# Patient Record
Sex: Male | Born: 1940 | Race: White | Hispanic: No | Marital: Married | Smoking: Current every day smoker
Health system: Southern US, Community
[De-identification: ages and names within clinical notes are randomized; demographics above are authoritative.]

## PROBLEM LIST (undated history)

## (undated) DIAGNOSIS — E785 Hyperlipidemia, unspecified: Secondary | ICD-10-CM

## (undated) DIAGNOSIS — I10 Essential (primary) hypertension: Secondary | ICD-10-CM

## (undated) DIAGNOSIS — F419 Anxiety disorder, unspecified: Secondary | ICD-10-CM

## (undated) DIAGNOSIS — IMO0002 Reserved for concepts with insufficient information to code with codable children: Secondary | ICD-10-CM

## (undated) DIAGNOSIS — J449 Chronic obstructive pulmonary disease, unspecified: Secondary | ICD-10-CM

## (undated) DIAGNOSIS — D649 Anemia, unspecified: Secondary | ICD-10-CM

## (undated) DIAGNOSIS — C3490 Malignant neoplasm of unspecified part of unspecified bronchus or lung: Secondary | ICD-10-CM

## (undated) DIAGNOSIS — Z8709 Personal history of other diseases of the respiratory system: Secondary | ICD-10-CM

## (undated) DIAGNOSIS — C719 Malignant neoplasm of brain, unspecified: Secondary | ICD-10-CM

## (undated) DIAGNOSIS — Z789 Other specified health status: Secondary | ICD-10-CM

## (undated) DIAGNOSIS — IMO0001 Reserved for inherently not codable concepts without codable children: Secondary | ICD-10-CM

## (undated) DIAGNOSIS — C61 Malignant neoplasm of prostate: Secondary | ICD-10-CM

## (undated) DIAGNOSIS — I499 Cardiac arrhythmia, unspecified: Secondary | ICD-10-CM

## (undated) DIAGNOSIS — I420 Dilated cardiomyopathy: Secondary | ICD-10-CM

## (undated) DIAGNOSIS — I2510 Atherosclerotic heart disease of native coronary artery without angina pectoris: Secondary | ICD-10-CM

## (undated) DIAGNOSIS — Z5112 Encounter for antineoplastic immunotherapy: Secondary | ICD-10-CM

## (undated) DIAGNOSIS — I739 Peripheral vascular disease, unspecified: Secondary | ICD-10-CM

## (undated) HISTORY — DX: Malignant neoplasm of brain, unspecified: C71.9

## (undated) HISTORY — DX: Dilated cardiomyopathy: I42.0

## (undated) HISTORY — PX: SKIN CANCER EXCISION: SHX779

## (undated) HISTORY — DX: Hyperlipidemia, unspecified: E78.5

## (undated) HISTORY — DX: Other specified health status: Z78.9

## (undated) HISTORY — DX: Reserved for inherently not codable concepts without codable children: IMO0001

## (undated) HISTORY — DX: Essential (primary) hypertension: I10

## (undated) HISTORY — PX: CHOLECYSTECTOMY: SHX55

## (undated) HISTORY — PX: CORONARY ARTERY BYPASS GRAFT: SHX141

## (undated) HISTORY — DX: Malignant neoplasm of unspecified part of unspecified bronchus or lung: C34.90

## (undated) HISTORY — PX: ABDOMINAL AORTIC ANEURYSM REPAIR: SUR1152

## (undated) HISTORY — DX: Reserved for concepts with insufficient information to code with codable children: IMO0002

## (undated) HISTORY — DX: Malignant neoplasm of prostate: C61

## (undated) HISTORY — PX: OTHER SURGICAL HISTORY: SHX169

## (undated) HISTORY — PX: EYE SURGERY: SHX253

## (undated) HISTORY — DX: Encounter for antineoplastic immunotherapy: Z51.12

## (undated) HISTORY — PX: COLONOSCOPY W/ POLYPECTOMY: SHX1380

---

## 1998-02-05 ENCOUNTER — Ambulatory Visit (HOSPITAL_COMMUNITY): Admission: RE | Admit: 1998-02-05 | Discharge: 1998-02-05 | Payer: Self-pay | Admitting: Gastroenterology

## 1999-03-07 DIAGNOSIS — C61 Malignant neoplasm of prostate: Secondary | ICD-10-CM

## 1999-03-07 HISTORY — PX: CARDIAC CATHETERIZATION: SHX172

## 1999-03-07 HISTORY — DX: Malignant neoplasm of prostate: C61

## 1999-04-26 ENCOUNTER — Other Ambulatory Visit: Admission: RE | Admit: 1999-04-26 | Discharge: 1999-04-26 | Payer: Self-pay | Admitting: Urology

## 1999-05-12 ENCOUNTER — Ambulatory Visit (HOSPITAL_COMMUNITY): Admission: RE | Admit: 1999-05-12 | Discharge: 1999-05-12 | Payer: Self-pay | Admitting: Gastroenterology

## 1999-05-12 ENCOUNTER — Encounter (INDEPENDENT_AMBULATORY_CARE_PROVIDER_SITE_OTHER): Payer: Self-pay | Admitting: Specialist

## 1999-05-13 ENCOUNTER — Encounter: Admission: RE | Admit: 1999-05-13 | Discharge: 1999-08-11 | Payer: Self-pay | Admitting: Radiation Oncology

## 1999-08-09 ENCOUNTER — Ambulatory Visit (HOSPITAL_BASED_OUTPATIENT_CLINIC_OR_DEPARTMENT_OTHER): Admission: RE | Admit: 1999-08-09 | Discharge: 1999-08-09 | Payer: Self-pay | Admitting: Urology

## 1999-08-09 ENCOUNTER — Encounter: Payer: Self-pay | Admitting: Urology

## 1999-09-06 ENCOUNTER — Encounter: Admission: RE | Admit: 1999-09-06 | Discharge: 1999-12-05 | Payer: Self-pay | Admitting: Radiation Oncology

## 1999-09-06 ENCOUNTER — Encounter: Payer: Self-pay | Admitting: Radiation Oncology

## 1999-09-28 ENCOUNTER — Ambulatory Visit: Admission: RE | Admit: 1999-09-28 | Discharge: 1999-09-28 | Payer: Self-pay | Admitting: Vascular Surgery

## 1999-10-10 ENCOUNTER — Ambulatory Visit: Admission: RE | Admit: 1999-10-10 | Discharge: 1999-10-10 | Payer: Self-pay | Admitting: Vascular Surgery

## 1999-10-25 ENCOUNTER — Ambulatory Visit (HOSPITAL_COMMUNITY): Admission: RE | Admit: 1999-10-25 | Discharge: 1999-10-25 | Payer: Self-pay | Admitting: Cardiology

## 1999-11-01 ENCOUNTER — Encounter: Payer: Self-pay | Admitting: Cardiothoracic Surgery

## 1999-11-08 ENCOUNTER — Inpatient Hospital Stay (HOSPITAL_COMMUNITY): Admission: RE | Admit: 1999-11-08 | Discharge: 1999-11-14 | Payer: Self-pay | Admitting: Cardiothoracic Surgery

## 1999-11-08 ENCOUNTER — Encounter: Payer: Self-pay | Admitting: Cardiothoracic Surgery

## 1999-11-09 ENCOUNTER — Encounter: Payer: Self-pay | Admitting: Cardiothoracic Surgery

## 1999-11-10 ENCOUNTER — Encounter: Payer: Self-pay | Admitting: Thoracic Surgery (Cardiothoracic Vascular Surgery)

## 1999-11-11 ENCOUNTER — Encounter: Payer: Self-pay | Admitting: Cardiothoracic Surgery

## 1999-11-12 ENCOUNTER — Encounter: Payer: Self-pay | Admitting: Cardiothoracic Surgery

## 2000-02-09 ENCOUNTER — Encounter: Payer: Self-pay | Admitting: Vascular Surgery

## 2000-02-13 ENCOUNTER — Encounter: Payer: Self-pay | Admitting: Vascular Surgery

## 2000-02-13 ENCOUNTER — Inpatient Hospital Stay (HOSPITAL_COMMUNITY): Admission: RE | Admit: 2000-02-13 | Discharge: 2000-02-17 | Payer: Self-pay | Admitting: Vascular Surgery

## 2000-02-13 ENCOUNTER — Encounter (INDEPENDENT_AMBULATORY_CARE_PROVIDER_SITE_OTHER): Payer: Self-pay | Admitting: Specialist

## 2000-02-14 ENCOUNTER — Encounter: Payer: Self-pay | Admitting: Vascular Surgery

## 2000-03-27 ENCOUNTER — Encounter (HOSPITAL_COMMUNITY): Admission: RE | Admit: 2000-03-27 | Discharge: 2000-06-25 | Payer: Self-pay | Admitting: Cardiology

## 2003-09-08 ENCOUNTER — Ambulatory Visit (HOSPITAL_COMMUNITY): Admission: RE | Admit: 2003-09-08 | Discharge: 2003-09-08 | Payer: Self-pay | Admitting: Gastroenterology

## 2009-09-09 ENCOUNTER — Ambulatory Visit (HOSPITAL_COMMUNITY)
Admission: RE | Admit: 2009-09-09 | Discharge: 2009-09-09 | Payer: Self-pay | Source: Home / Self Care | Admitting: Family Medicine

## 2009-09-09 ENCOUNTER — Ambulatory Visit: Payer: Self-pay | Admitting: Vascular Surgery

## 2009-09-09 ENCOUNTER — Encounter (INDEPENDENT_AMBULATORY_CARE_PROVIDER_SITE_OTHER): Payer: Self-pay | Admitting: Orthopedic Surgery

## 2010-07-22 NOTE — H&P (Signed)
[place]. [facility]  Patient:    Steve Baker, Steve Baker                       MRN: [id number] Adm. Date:  10/25/99 Attending:  Peter M. Swaziland, M.D. CC:         Dr. Windle Guard             Dr. Tawanna Cooler Early                         History and Physical  DATE OF BIRTH: [birth year]  CHIEF COMPLAINT: Abnormal ECG.  HISTORY OF PRESENT ILLNESS: Mr. Orsak is a 70 year old who was recently scheduled for elective abdominal aortic aneurysm repair. This procedure was canceled due to an abnormal resting ECG. The patient has no prior history of angina, congestive heart failure and denies any symptoms of chest pain, dyspnea or palpitations. He has no prior history of myocardial infarction. On October 21, 1999 he underwent an Adenosine Cardiolite study which was markedly abnormal with a large ischemic defect in the inferolateral wall distribution and apex. There was also mild diffuse septal ischemia. Ejection fraction was moderately reduced at 42%. Given this abnormal finding he is now admitted for cardiac catheterization.  PAST MEDICAL HISTORY: 1.  Hypertension. 2.  Severe hypercholesterolemia. 3.  Tobacco abuse. 4.  He has an abdominal aortic aneurysm apparently measuring at 4.9 cm. 5.  Previous cholecystectomy. 6.  Known peripheral vascular disease with stable claudication. 7.  History of prostate cancer status post radioactive seed implant in     February of 2001.  CURRENT MEDICATIONS: 1.  Hydrochlorothiazide 25 mg per day. 2.  Atenolol 25 mg per day. 3.  Flomax 0.4 mg daily. 4.  Coated aspirin daily. 5.  Lipitor 10 mg per day.  ALLERGIES: No known allergies.  SOCIAL HISTORY: The patient smokes two and a half packs per day and has for 40 years. He drinks approximately 10 beers per week. He works on [employer]. He is married and has two children.  FAMILY HISTORY: Father died at age 77 of myocardial infarction. Mother died at age 54 and had Alzheimers  disease, lung cancer and renal failure. One sister has coronary disease.  REVIEW OF SYSTEMS: The patient does note claudication with walking, right greater than left. He has no history of TIA or CVA symptoms, no bleeding disorder and no history of peptic ulcer disease.  PHYSICAL EXAMINATION:  GENERAL: The patient is a white male in no apparent distress.  VITAL SIGNS: Blood pressure 112/70, pulse 64 and regular.  HEENT: Conjunctivae are clear. Pupils equal, round and reactive. Oropharynx is clear with upper dental plate.  NECK: Supple without JVD, adenopathy, thyromegaly or bruits.  LUNGS: Clear.  CARDIAC: Reveals a regular rate and rhythm without murmurs, rubs, gallops or clicks.  ABDOMEN: Soft and nontender. There is a pulsatile mass in the midline that is without bruit or tenderness. No hepatosplenomegaly.  EXTREMITIES: Femoral pulses are 2+ and symmetric. Dorsalis pedis pulses are 1+.  NEUROLOGIC: Grossly intact.  LABORATORY DATA: ECG shows normal sinus rhythm with ST-T wave changes consistent with inferolateral ischemia.  IMPRESSION: 1.  Arteriosclerotic coronary artery disease with markedly abnormal Adenosine     Cardiolite study. 2.  Abdominal aortic aneurysm. 3.  Peripheral vascular disease. 4.  Tobacco abuse. 5.  Hypertension. 6.  Severe hypercholesterolemia. 7.  Prostate cancer status post radioactive seed implant.  PLAN: The patient will be admitted  for cardiac catheterization. DD:  10/24/99 TD:  10/24/99 Job: [id number]

## 2010-07-22 NOTE — Cardiovascular Report (Signed)
[place]. [facility]  Patient:    Steve Baker, Steve Baker                     MRN: [id number] Proc. Date: 10/25/99 Adm. Date:  [id number] Disc. Date: [id number] Attending:  Swaz[place], Peter Manning CC:         Larina Earthly, M.D.  Mikey Bussing, M.D.  Hadassah Pais. Jeannetta Nap, M.D.   Cardiac Catheterization  INDICATIONS FOR PROCEDURE:  The patient is a 70 year old white male with multiple cardiac risk factors including history of tobacco abuse, severe hypercholesterolemia, and hypertension.  He also has peripheral vascular disease and abdominal aortic aneurysm.  He is noted to have an abnormal ECG on preoperative evaluation for his aneurysmal repair.  This led to a adenosine Cardiolite study which was markedly abnormal for ischemia.  ACCESS:  Via the right femoral artery using the standard Seldinger technique.  EQUIPMENT:  A 6 French 4 cm right and left Judkins catheter, 6 French pigtail catheter, 6 French arterial sheath.  MEDICATIONS:  Local anesthesia with 1% Xylocaine.  CONTRAST:  Omnipaque 120 cc.  COMMENTARY:  The patient tolerated the procedure well without complications.  HEMODYNAMIC DATA:  Aortic pressure is 119/63, left ventricular pressure is 61 with an EDP of 16 mmHg.  ANGIOGRAPHIC DATA:  Left coronary artery:  The left coronary artery arises normally.  Left main:  The left main coronary artery is very short with essentially shared coronary ostia for the LAD and left circumflex vessels.  Left anterior descending:  The left anterior descending artery is occluded following a large first septal perforator branch and the first diagonal.  The mid to distal LAD fills by both left to left and right to left collaterals. The first diagonal branch has a 90% stenosis proximally.  Left circumflex:  The left circumflex coronary artery gives rise to two marginal branches.  The first diagonal branch is small in caliber and has a 90% stenosis proximally.  The  second obtuse marginal is a large branch which has a 95% stenosis proximally.  Right coronary artery:  The right coronary artery arises and distributes normally.  There is a 90% ostial stenosis with diffuse disease in the proximal vessel.  It is occluded in the mid vessel.  The distal right coronary fills by both left to right and right to right collaterals.  LEFT VENTRICULAR ANGIOGRAPHY:  The left ventricular angiography is performed in the RAO view.  This demonstrates normal left ventricular size.  There is akinesis of the inferobasal and mid inferior wall with hypokinesia of the entire inferior wall.  Overall there is moderate left ventricular dysfunction with ejection fraction estimated at 45%.  There is no significant mitral regurgitation.  FINAL INTERPRETATION: 1. Severe three-vessel obstructive atherosclerotic coronary artery disease. 2. Moderate left ventricular dysfunction.  PLAN:  Would recommend coronary artery bypass surgery. DD:  10/26/99 TD:  10/26/99 Job: [id number]

## 2010-07-22 NOTE — Op Note (Signed)
NAME:  Steve Baker, Steve Baker                        ACCOUNT NO.:  [id number]   MEDICAL RECORD NO.:  [id number]                   PATIENT TYPE:  AMB   LOCATION:  ENDO                                 FACILITY:  [facility]   PHYSICIAN:  James L. Malon Kindle., M.D.          DATE OF BIRTH:  05-05-1940   DATE OF PROCEDURE:  09/08/2003  DATE OF DISCHARGE:                                 OPERATIVE REPORT   PROCEDURE:  Colonoscopy.   MEDICATIONS:  Fentanyl 62.5 mcg, Versed 7 mg IV.   SCOPE:  Olympus pediatric colonoscope.   INDICATIONS:  Patient with previous history of adenomatous polyps.  This is  done as a three-year polyp procedure.  Also has minimal known AVM of the  cecum.   DESCRIPTION OF PROCEDURE:  The procedure had been explained to the patient  and consent obtained.  With the patient in the left lateral decubitus  position, the Olympus scope was inserted and advanced.  The prep was  excellent.  We were able to reach the cecum using slight bit of abdominal  pressure.  The ileocecal valve and appendiceal orifice were seen.  The scope  was withdrawn and the cecum was seen well. There was a 0.5 cm AVM in the  cecum, was not actively bleeding.  No polyps were seen.  The ascending  colon, transverse colon, descending and sigmoid colon were seen well and  were unremarkable.  No significant diverticular disease.  No polyps were  seen throughout.  The rectum was seen well, was free of polyps.  The scope  was withdrawn.  The patient tolerated the procedure well.   ASSESSMENT:  1. No evidence of further colon polyps.  V12.72.  2. 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  569.84.   PLAN:  Will recommend yearly hemoccults and another colonoscopy in 5 years.                                               James L. Malon Kindle., M.D.    Waldron Session  D:  09/08/2003  T:  09/08/2003  Job:  [id number]   cc:   Windle Guard, M.D.  [address]  [place], [place] [address]  Fax:  [phone]

## 2010-07-22 NOTE — Discharge Summary (Signed)
[place]. [facility]  Patient:    Steve Baker, Steve Baker                     MRN: [id number] Adm. Date:  [id number] Disc. Date: [id number] Attending:  Mikey Bussing Dictator:   Maxwell Marion, RNFA CC:         Hadassah Pais. Jeannetta Nap, M.D.             Peter M. Swaziland, M.D.                           Discharge Summary  DATE OF BIRTH:  1941/01/15  ADMISSION DIAGNOSIS:  Three-vessel coronary artery disease.  PAST MEDICAL HISTORY: 1. A 4.5 cm abdominal aortic aneurysm. 2. Hypertension. 3. Elevated lipids. 4. History of prostatic carcinoma.  DISCHARGE DIAGNOSES: 1. Three-vessel coronary artery disease status post coronary artery bypass    grafting. 2. Postoperative anemia, resolved. 3. Postoperative confusion, resolved.  ALLERGIES:  No known drug allergies.  HOSPITAL COURSE AND PROCEDURE:  Mr. Steve Baker is a 70 year old black male with a history significant for coronary artery disease and a 4.5 cm abdominal aortic aneurysm.  Prior to repair of his AAA by Dr. Arbie Cookey, Dr. Peter Swaziland performed a stress test and cardiac catheterization which revealed severe three-vessel coronary artery disease.  He was referred for surgical revascularization to Dr. Kathlee Steve Baker who saw him in his office on October 28, 1999.  After examination of the patient and reviewing his records, Dr. Donata Baker agreed that coronary artery bypass grafting was the preferred treatment for this patient.  Therefore, Steve Baker was scheduled for surgery on November 08, 1999.  On that day he underwent an uneventful coronary artery bypass grafting x 4 by Dr. Kathlee Steve Baker.  Graft placements at the time of procedure were left internal mammary artery to the left anterior descending artery, saphenous vein was grafted to the diagonal artery, saphenous vein was grafted to the right coronary artery, and the left radial artery was harvested and grafted to the OM1.  At the completion of the procedure  he was transferred in stable condition to the SICU.  His postoperative course was notable for a postoperative anemia requiring transfusion on November 09, 1999.  His anemia is resolving with last hemoglobin and hematocrit measuring 9.1 and 26.4.  He is also noted to have postoperative confusion which resolved spontaneously by November 10, 1999, he is also noted to have several episodes of asymptomatic arrhythmias on November 11, 1999.  Steve Baker has made satisfactory progress in recovering from his surgery, and we anticipate he can be discharged home tomorrow November 14, 1999.  DISCHARGE MEDICATIONS: 1. Enteric-coated aspirin one q.d. 2. Ultram one or two q.4-6h. p.r.n. pain. 3. Imdur 30 mg one q.d. 4. Lasix 40 mg one q.d. x 5 days. 5. KCL 20 mEq one q.d. x 5 days. 6. Iron complex 150 mg one q.d. 7. Habitrol patch 21 mg change it daily for 14 days, then change to Habitrol    patch of 14 mg change daily for another 14 days. 8. He is also instructed to resume his home medications of atenolol 12.5 mg    b.i.d., Flomax 0.4 mg q.d., and Lipitor 10 mg q.d.  FOLLOWUP:  Steve Baker will be followed up in one week in the [facility] office for staple removal, and he will also see Dr. Donata Baker in about three weeks.  The [facility]  office will call with exact dates and times.  He was also instructed to follow up with Dr. Swaziland in his office in two weeks, and obtain a chest x-ray at that time. DD:  11/13/99 TD:  11/14/99 Job: [id number]

## 2010-07-22 NOTE — Op Note (Signed)
[place]. [facility]  Patient:    Steve Baker, Steve Baker                     MRN: [id number] Proc. Date: 02/13/00 Adm. Date:  [id number] Attending:  Alyson Locket CC:         Peter M. Swaziland, M.D.  Hadassah Pais. Jeannetta Nap, M.D.   Operative Report  PREOPERATIVE DIAGNOSIS:  Abdominal aortic aneurysm.  POSTOPERATIVE DIAGNOSIS:  Abdominal aortic aneurysm.  PROCEDURE:  Resection of graft, abdominal aortic aneurysm replacement with 16 x 8 Hemashield aorta to bilateral common femoral artery bypass.  SURGEON:  Gretta Began, M.D.  ASSISTANT:  Adair Patter, P.A.  ANESTHESIA:  General endotracheal tube.  COMPLICATIONS:  None.  DISPOSITION:  To recovery room stable.  PROCEDURE:  After the patient was taken to the operating room and placed in supine position where the area of the abdomen and both groins were prepped and draped in the usual sterile fashion.  Incision was made from the level of the xiphoid to the pubis and carried down through the midline fascia with electrocautery.  The patient had adhesions of the omentum up to the anterior abdominal wall from a prior cholecystectomy.  The adhesions were all taken down.  The omni retractor was used for exposure.  The transverse colon and omentum reflected superiorly and the small bowel was reflected to the right. The retroperitoneum was opened, and the duodenum was mobilized to the right. The aorta was encircled below the level of the renal arteries.  Dissection was then continued down onto the bifurcation.  The common iliac arteries were not aneurysmal but were extensively calcified.  These were encircled with vessel loops and were felt to be unsuitable for distal anastomosis.  The external iliac arteries were also extensively calcified.  For this reason separate incisions were made over the femoral pulse bilaterally and carried down to isolate the common superficial femoral and profunda femoris  arteries bilaterally.  These were encircled with vessel loops.  A tunnel was created from the level of the groin to the bifurcation taking care to pass behind the level of the ureters bilaterally.  The patient was given 7000 units of intravenous heparin and 25 grams of mannitol.  After adequate circulation time the area was occluded below the level of the renal arteries and the common iliac arteries were doubly ligated with 5 Polydek suture bilaterally.  The aorta was opened with electrocautery longitudinally.  Lumbar backbleeding was controlled with 2-0 silk figure of eight sutures and the inferior mesenteric artery was controlled at its takeoff with 2-0 silk figure-of-eight sutures as well.  The aorta was transected and the 16 x 8 Hemashield graft was brought into the field.  The felt strip was used for reinforcement and the aortic graft was sewn end-to-end to the aorta with a running 3-0 Prolene suture.  The anastomosis was tested and found to be adequate.  Each limb of the graft was then brought to the respective groins.  The common superficial femoral and profundus femoris arteries were occluded bilaterally.  The common femoral arteries were opened with a 11 blade ______ onto the superficial femoral artery with Potts scissors bilaterally.  The graft was then cut to appropriate length, the omentum was end-to-side to the arteries with running 5-0 Prolene sutures bilaterally.  Prior to the completion of each anastomosis the usual ______ was undertaken.  The anastomosis having been completed the clamps were removed.  The patient was given 50 mg  of protamine to reverse the heparin, the wounds were irrigated with saline.  Hemostasis with electrocautery.  Wounds were closed with several layers of 2-0 Vicryl in the groins and skin was closed with skin clips.  The wall of the aneurysm was closed over the graft with a running 2-0 Vicryl suture.  The retroperitoneum was then closed with a  running 2-0 Vicryl suture to exclude the bowel from the graft.  The bowel was run in its entirety and found to be without injuries  It was placed back in the pelvis.  Transverse colon, and omentum were placed over this.  The midline fascia was closed with 1 PDS suture beginning proximally and distally and tying in the middle.  The skin was closed with skin clips.  Sterile dressing was applied.  The patient was taken to the recovery room in stable condition. DD:  02/13/00 TD:  02/13/00 Job: [id number]

## 2010-07-22 NOTE — Discharge Summary (Signed)
[place]. [facility]  Patient:    MORDECHAI, MATUSZAK                     MRN: [id number] Adm. Date:  [id number] Disc. Date: 02/17/00 Attending:  Alyson Locket Dictator:   Marlowe Kays, P.A. CC:         Claudette Laws, M.D.  Buren Kos, M.D.  Peter M. Swaziland, M.D.  Wynn Banker, M.D.   Discharge Summary  DATE OF BIRTH:  04/02/1940  PRIMARY DISCHARGE DIAGNOSES: 1. Status post abdominal aortic aneurysm repair and graft. 2. Peripheral vascular occlusive disease status post aortobifemoral bypass    graft. 3. Hypertension. 4. Hypercholesterolemia. 5. Peripheral vascular occlusive disease - claudication, resolved. 6. Peptic ulcer disease. 7. Alcohol abuse.  PROCEDURES: 1. Status post aortobifemoral bypass graft on February 13, 2000 by    Dr. Arbie Cookey. 2. Status post repair and graft of the abdominal aortic aneurysm on    February 13, 2000 by Dr. Arbie Cookey.  DISCHARGE MEDICATIONS: 1. Lipitor 10 mg one p.o. q.d. 2. Atenolol 12.5 mg p.o. q.d. 3. Pepcid 20 mg one p.o. b.i.d. 4. Tylox one or two p.o. q.4-6h. p.r.n. for pain.  CONDITION ON DISCHARGE:  Improved.  FOLLOW-UP:  Dr. Arbie Cookey in two weeks after discharge.  Staple removal one week after discharge.  ALLERGIES:  No known drug allergies.  HOSPITAL COURSE:  Mr. Dayhoff is a pleasant 70 year old white male with a known history of abdominal aortic aneurysm, of about 4.8 cm.  As well, he also had mild calcification of the iliac arteries.  Dr. Arbie Cookey upon evaluation recommended that the patient undergo open repair of this aneurysm, as well as aortobifemoral bypass graft.  On February 13, 2000 the patient underwent the procedure, tolerating it well.  No complications were seen. During the following days of his hospitalization, the patient remained stable, vital signs stable, afebrile, his saturations were in the 90s on room air.  He had good input and urinary output.  He had positive  bowel sounds and flatus.  A lower extremity arterial evaluation was performed on February 14, 2000, showing improved ABIs, with a right ABI of 0.92, and a left ABI of 1.0 as well as trace 5-6 wave forms bilaterally.  If the patient shows no other complications during his hospital stay, it is anticipated that he will be discharged on February 17, 2000 in stable condition. DD:  02/16/00 TD:  02/16/00 Job: [id number]

## 2010-07-22 NOTE — Op Note (Signed)
[place]. [facility]  Patient:    Steve Baker, Steve Baker                     MRN: [id number] Proc. Date: 08/09/99 Adm. Date:  [id number] Disc. Date: [id number] Attending:  Monica Becton                           Operative Report  PREOPERATIVE DIAGNOSES: 1. Carcinoma of the prostate gland. 2. Status post external radiation therapy, status post 4500 cGy.  POSTOPERATIVE DIAGNOSES: 1. Carcinoma of the prostate gland. 2. Status post external radiation therapy, status post 4500 cGy.  OPERATION:  Transperineal radioactive seed implantation to the prostate gland (I125).  SURGEON:  Claudette Laws, M.D.  RADIOLOGIST:  Wynn Banker, M.D.  DESCRIPTION OF PROCEDURE:  The patient was prepped and draped in the dorsal lithotomy position under intubated general anesthesia.  A Foley catheter was placed.  A Robinson catheter was put into the rectum, and the stabilizing unit was brought in.  Transrectal ultrasound was placed, and images were recreated on the ultrasound screen conforming to the preplanning _____ study.  Anchors were placed at C3.0 and E3.0.  Transperineal seed implantation was then performed using overhead C-arm and ultrasound for correct deposition of the seeds.  A total of 14 seeds needles were used, and 50 seeds were used, I125 on a Vicryl strand.  At the end of the procedure, a cystoscopy confirmed no seeds in the bladder.  The catheter was replaced and hooked to catheter drainage, and the patient was taken back to the recovery room in satisfactory condition. DD:  08/09/99 TD:  08/11/99 Job: [id number]

## 2010-07-22 NOTE — Op Note (Signed)
[facility]  Patient:    Steve Baker, Steve Baker                       MRN: [id number] Proc. Date: 09/28/99 Attending:  Larina Earthly, M.D. CC:         Wynn Banker, M.D.             Claudette Laws, M.D.             Hadassah Pais. Jeannetta Nap, M.D.                           Operative Report  PREOPERATIVE DIAGNOSIS:  Abdominal aortic aneurysm.  POSTOPERATIVE DIAGNOSIS:  Abdominal aortic aneurysm.  PROCEDURE:  Aortogram with bilateral lower extremity runoff.  SURGEON:  Dr. Arbie Cookey.  ANESTHESIA:  1% lidocaine local and 2 mg IV Versed sedation.  COMPLICATIONS:  None.  DISPOSITION:  To holding area stable.  DESCRIPTION OF PROCEDURE:  The patient was taken to the cath lab, placed in supine position where the area of both groins was prepped and draped in the usual sterile fashion. Using local anesthesia and a single wall stick, the right common femoral artery was entered and a guidewire was passed up the level of the suprarenal aorta. A 5 French sheath was passed over the guidewire. A pigtail catheter was passed to the level of the suprarenal aorta and AP projections were undertaken. This revealed infrarenal abdominal aortic aneurysm with moderate calcification and ectasia of the infrarenal aorta. There was a single right widely patent renal artery and 2 left renal arteries with no evidence of stenoses in the renal arteries. Lateral projection was then undertaken and this revealed a nice infrarenal neck before initiation of the aneurysm. The pigtail catheter was then withdrawn down to the level of the aortic bifurcation. AP projection was undertaken. Left leg runoff was obtained through the same injection. The patient had a large infrarenal aneurysm that stopped but ended at the level of the aortic bifurcation. The hypogastric artery was occluded on the left. There was moderate atherosclerotic changes with no flow limiting stenosis in the left iliac system. The left  superficial femoral artery was patent; however, there was subtotal occlusion in the short segment in the mid superficial femoral artery. The popliteal artery was widely patent. There was a moderate amount of disease in the tibioperoneal trunk. All three trifurcation vessels were opened with the dominant vessel being the posterior tibial runoff and to the left. The pigtail catheter was then removed and right leg runoff was obtained via the right femoral sheath. This revealed again moderate atherosclerotic changes throughout the iliac system. The superficial femoral artery was patent; however, again at mid section there was marked narrowing with collateral formation around this. The popliteal artery was widely patent. There was occlusion of the anterior tibial artery proximally with peroneal and posterior tibial runoff into the foot. The patient tolerated the procedure without immediate complication and was transferred to the holding area after the sheath was removed.  FINDINGS: 1. Infrarenal abdominal aortic aneurysm. 2. Single right and double left renal arteries widely patent. 3. Aneurysm ending at the aortic bifurcation with moderate atherosclerotic    changes in the iliac system as described. 4. High grade focal superficial femoral artery mid stenoses bilaterally    with runoff as described. DD:  09/28/99 TD:  09/29/99 Job: [id number]

## 2014-02-17 ENCOUNTER — Other Ambulatory Visit: Payer: Self-pay | Admitting: Gastroenterology

## 2014-05-28 ENCOUNTER — Other Ambulatory Visit: Payer: Self-pay | Admitting: Family Medicine

## 2014-05-28 DIAGNOSIS — Z139 Encounter for screening, unspecified: Secondary | ICD-10-CM

## 2014-06-04 ENCOUNTER — Ambulatory Visit
Admission: RE | Admit: 2014-06-04 | Discharge: 2014-06-04 | Disposition: A | Discharge status: Home / Self Care | Payer: Self-pay | Source: Ambulatory Visit | Attending: Family Medicine | Admitting: Family Medicine

## 2014-06-04 DIAGNOSIS — Z139 Encounter for screening, unspecified: Secondary | ICD-10-CM

## 2014-06-04 IMAGING — CT CT CHEST LUNG CANCER SCREENING LOW DOSE W/O CM
1 of 5 series · 15 of 40 positions shown, 19 images · non-contrast
Comparison: None.

CLINICAL DATA: 74-year-old male with 84 pack year history of
smoking. Lung cancer screening.

EXAM:
CT CHEST WITHOUT CONTRAST
TECHNIQUE: Multidetector CT imaging of the chest was performed following the
standard protocol without IV contrast..

[Series 3: lung windows · axial · 0.71mm/px · z∈[-322,-19]mm · 15 of 269 slices shown, 19 images]
[im 13/269  mediastinal]
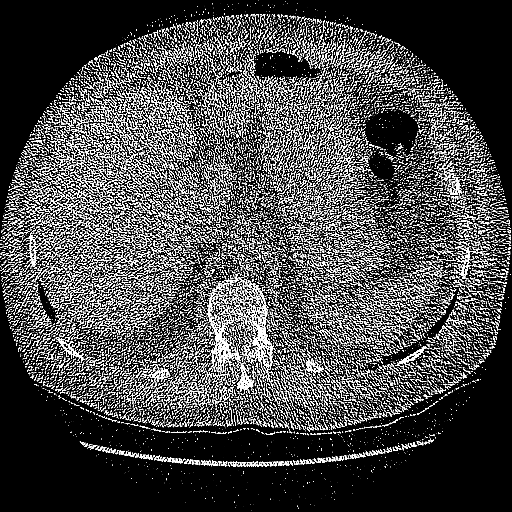
[im 13/269  lung]
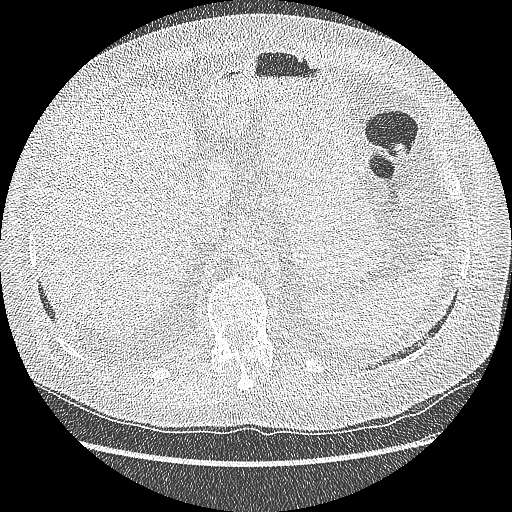
[im 39/269  lung]
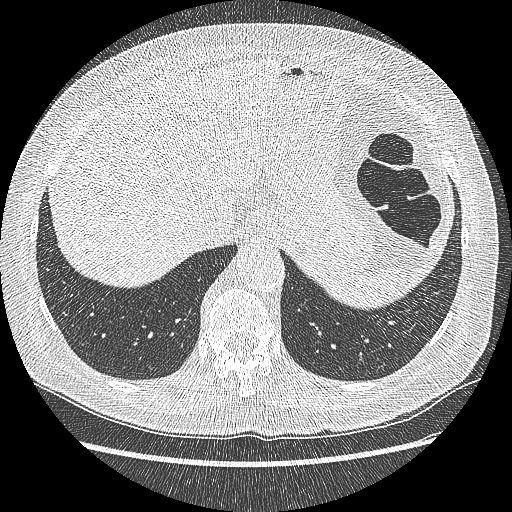
[im 52/269  lung]
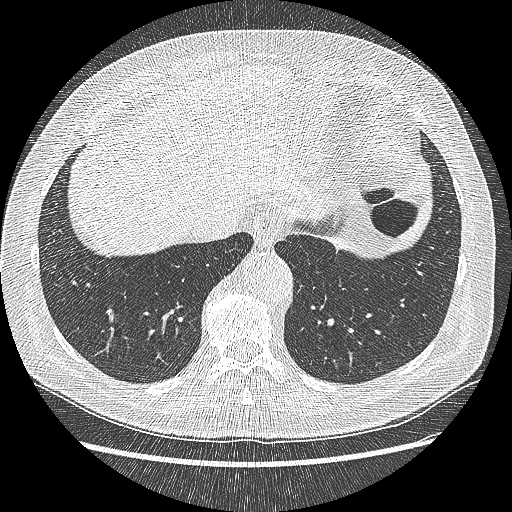
[im 64/269  lung]
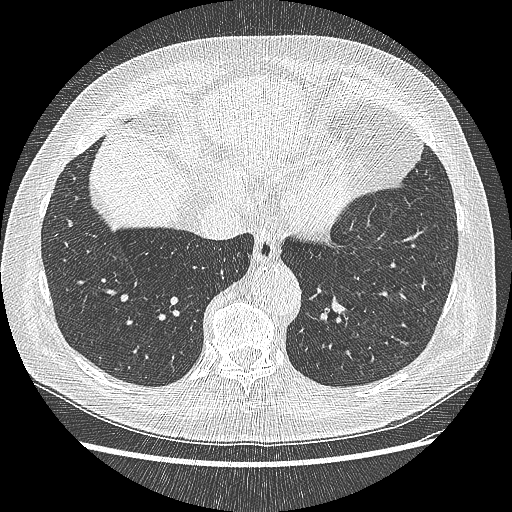
[im 90/269  mediastinal]
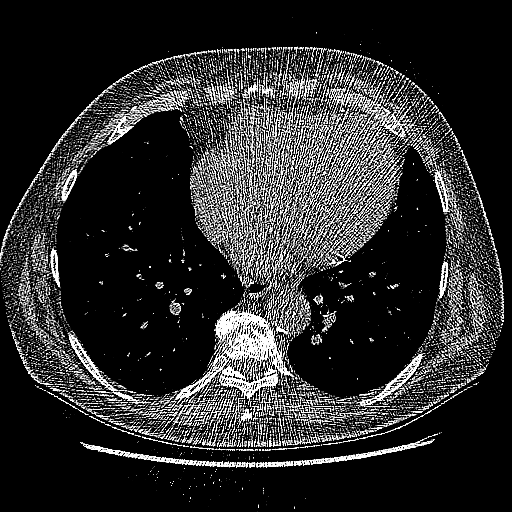
[im 90/269  lung]
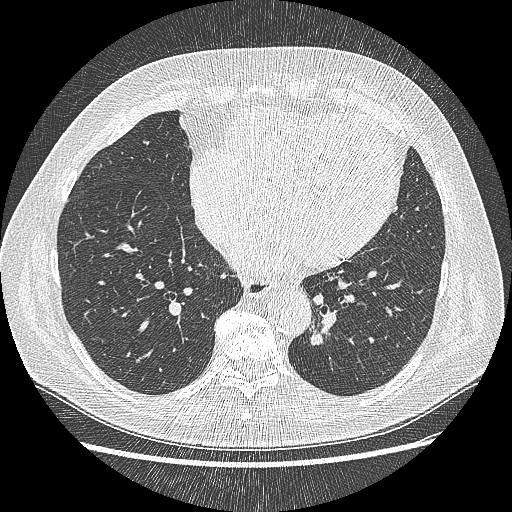
[im 103/269  lung]
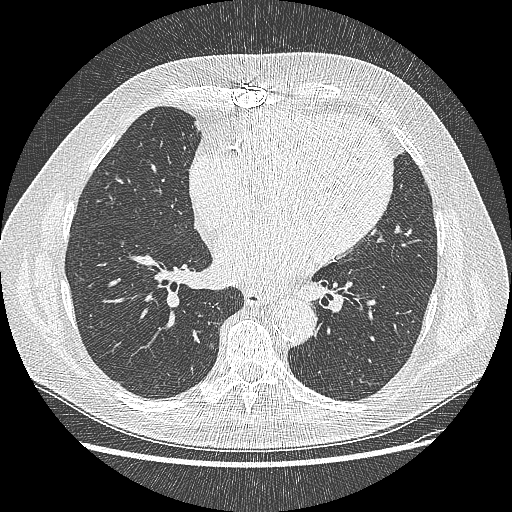
[im 115/269  lung]
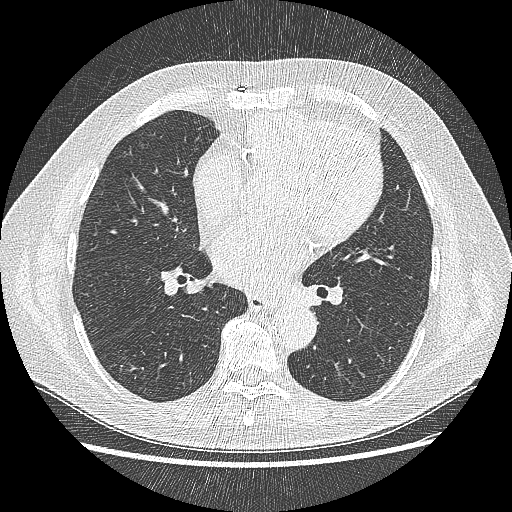
[im 141/269  lung]
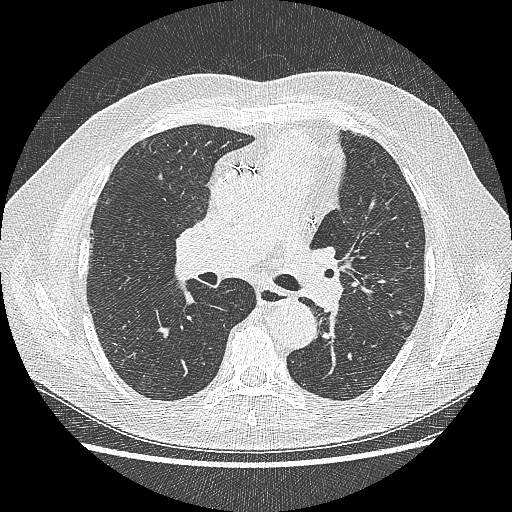
[im 154/269  mediastinal]
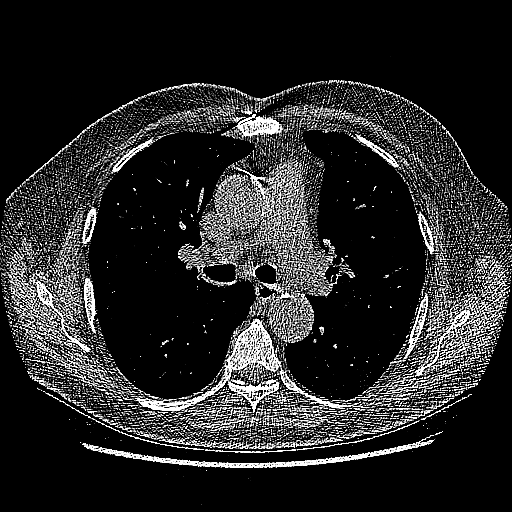
[im 154/269  lung]
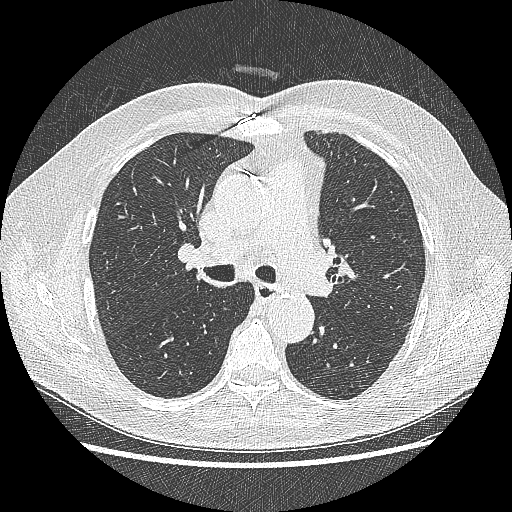
[im 166/269  lung]
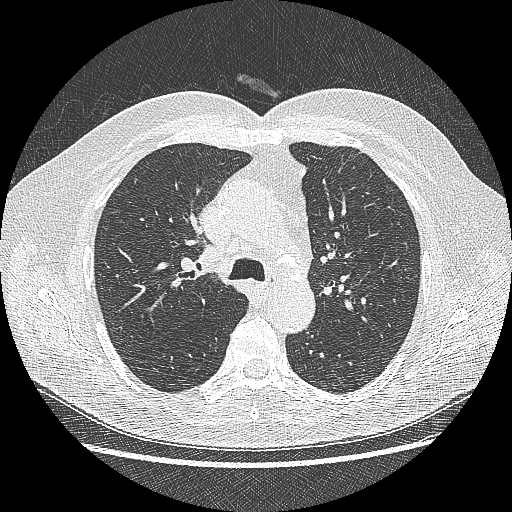
[im 192/269  lung]
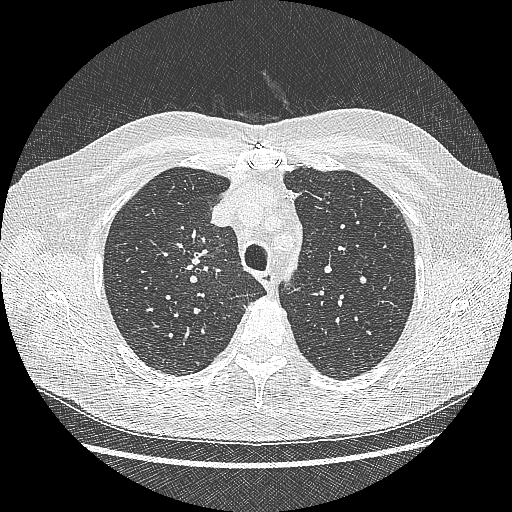
[im 205/269  lung]
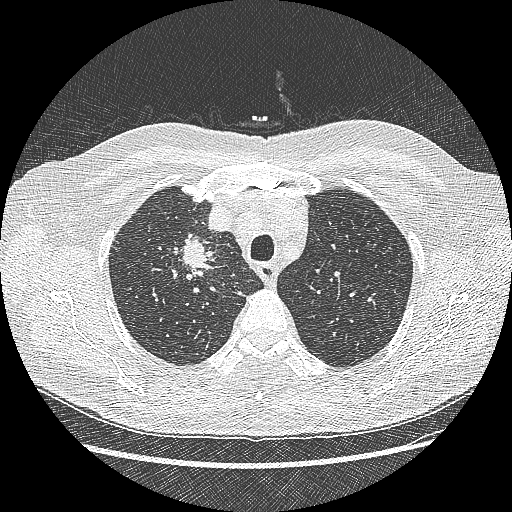
[im 217/269  mediastinal]
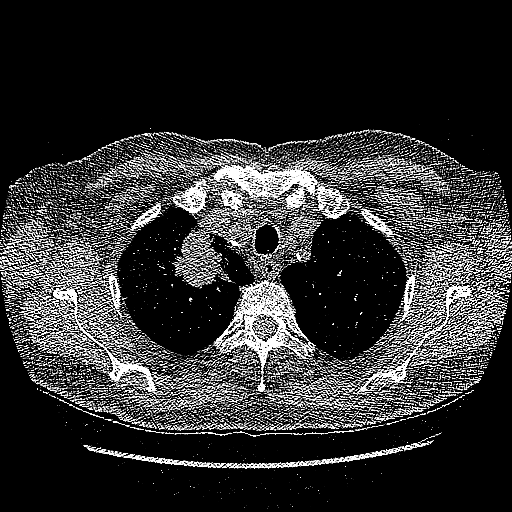
[im 217/269  lung]
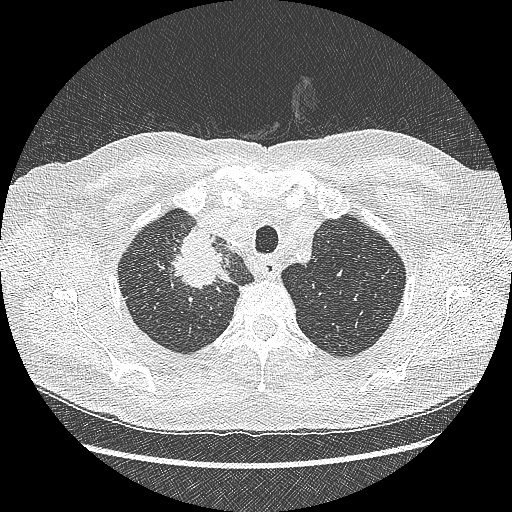
[im 243/269  lung]
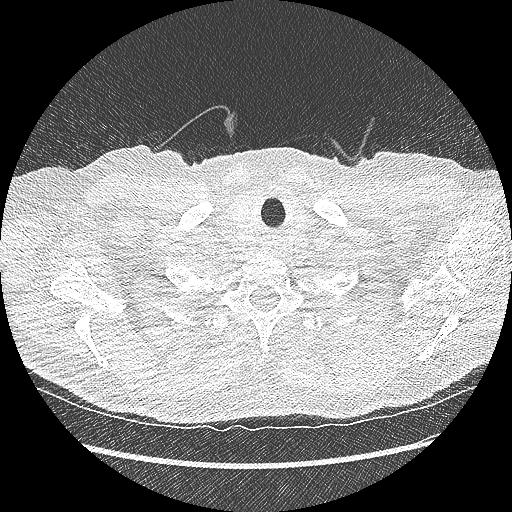
[im 256/269  lung]
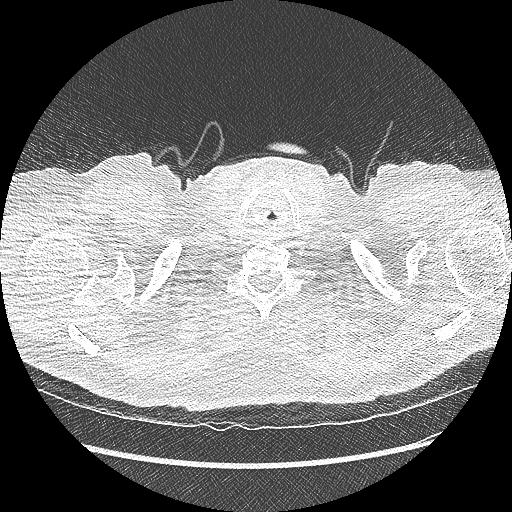

[15 of 40 positions shown; findings below may reference images not displayed]

FINDINGS: Mediastinum / Lymph Nodes: There is no axillary lymphadenopathy. 14
mm precarinal lymph node is identified. No other lymphadenopathy is
seen in the mediastinum or either hilum, based on size criteria.
Heart is upper normal for size in this patient status post CABG.

Lungs / Pleura: Lung windows demonstrate diffuse centrilobular
emphysema bilaterally with associated bronchial wall thickening.
There is a large spiculated mass in the medial right upper lobe,
contiguous with the anteromedial pleura. The volume derived mean
diameter of this lesion is 3.9 cm. A second spiculated lesion is
identified in the posteromedial left lower lobe (see image 184
series 3). The volume derived mean diameter for this lesion is
cm.

[HOSPITAL] / Soft Tissues: Bone windows reveal no worrisome lytic or
sclerotic osseous lesions.

Upper Abdomen:  Unremarkable.
IMPRESSION: Lung-RADS Category 4B, suspicious. Additional imaging evaluation or
consultation with pulmonary medicine or thoracic surgery
recommended.

1.4 cm spiculated nodule is identified in the contralateral lower
lobe.

Mildly enlarged subcarinal lymph node raises concern for mediastinal
lymphadenopathy.

I personally called the results of this study to the clinical nurse
navigator of the lung cancer screening program, NOUVAL, at

## 2014-06-05 ENCOUNTER — Encounter: Payer: Self-pay | Admitting: Acute Care

## 2014-06-08 ENCOUNTER — Telehealth: Payer: Self-pay | Admitting: Acute Care

## 2014-06-08 ENCOUNTER — Other Ambulatory Visit: Payer: Self-pay | Admitting: Acute Care

## 2014-06-08 DIAGNOSIS — R918 Other nonspecific abnormal finding of lung field: Secondary | ICD-10-CM

## 2014-06-08 NOTE — Telephone Encounter (Signed)
I first called Steve Baker on Friday 06/05/14 to explain to him I had spoken with his PCP ( Dr. Arelia Sneddon) about the results of his lung cancer screening CT.Dr. Arelia Sneddon requested that I call the patient and let him know there was a suspicious nodule that required further diagnostic follow up. I spoke with both the patient and his wife at the time. I explained that Dr. Lamonte Sakai had reviewed the scan, and that requested a PET scan be done for further diagnostic testing/diagnosis.I told them I would call Monday 06/08/14 with a date and time for the scan. I called Steve Baker back today 06/08/14, as promised, to let him know he is scheduled for the PET scan 06/17/24 at 10 am. I explained that he is not to eat or drink anything with sugar for 6 hours prior to the scan.He is not diabetic. I explained that he can drink water without restrictions, and to remain hydrated. Both Steve Baker and his wife verbalized understanding.They know to go to the radiology department on 06/18/14 at 09:45. I have provided  them my contact information should they have any further questions.

## 2014-06-08 NOTE — Telephone Encounter (Signed)
I spoke with patient and his wife about scheduling PET scan as follow up to a suspicious pulmonary nodule found on lung cancer screening LDCT done on 06/04/14. Patient is scheduled for PET scan 06/18/14 at 1000( this is the first available date per the scheduler). He has been advised not to eat of drink anything with sugar for 6 hours prior to the scan.I reminded him he can drink water without restriction, and not to get dehydrated. The patient is not diabetic.He was instructed on where to go for the scan, and has my contact information for any further questions or concerns.

## 2014-06-16 ENCOUNTER — Encounter: Payer: Self-pay | Admitting: Acute Care

## 2014-06-18 ENCOUNTER — Ambulatory Visit (HOSPITAL_COMMUNITY)
Admission: RE | Admit: 2014-06-18 | Discharge: 2014-06-18 | Disposition: A | Discharge status: Home / Self Care | Payer: Medicare Other | Source: Ambulatory Visit | Attending: Acute Care | Admitting: Acute Care

## 2014-06-18 DIAGNOSIS — R918 Other nonspecific abnormal finding of lung field: Secondary | ICD-10-CM

## 2014-06-18 LAB — GLUCOSE, CAPILLARY: Glucose-Capillary: 98 mg/dL (ref 70–99)

## 2014-06-18 MED ORDER — FLUDEOXYGLUCOSE F - 18 (FDG) INJECTION
9.7000 | Freq: Once | INTRAVENOUS | Status: AC | PRN
  Administered 2014-06-18: 9.7 via INTRAVENOUS

## 2014-06-19 ENCOUNTER — Telehealth: Payer: Self-pay | Admitting: Acute Care

## 2014-06-19 NOTE — Telephone Encounter (Signed)
I spoke with Dr. Arelia Sneddon, [place], and let him know the results of the PET scan.( He ordered the original screening CT) I asked him if he would like to talk to the patient and his wife, and he told me it was all right for me to speak with them.I then called Steve Baker, who was driving at the time. She pulled over, but I chose not to tell her we suspected the Mr. Steve Baker had stage IV lung cancer, as she was alone, and would be driving. We will tell both she and Steve Baker together at the appointment we have scheduled for Tuesday 4/19 at 1200. Steve Baker was advised that they should arrive at 11:30 to complete paperwork.She wrote everything down. I gave her my contact information in case she has any questions. I also gave her the address and location, including  [place], for the pulmonary office.We will evaluate the patient for the best diagnostic plan, and he will then be discussed this Thursday, 4/21 at 07:30 at  [facility].

## 2014-06-23 ENCOUNTER — Encounter: Payer: Self-pay | Admitting: Emergency Medicine

## 2014-06-23 ENCOUNTER — Ambulatory Visit (INDEPENDENT_AMBULATORY_CARE_PROVIDER_SITE_OTHER): Payer: Medicare Other | Admitting: Emergency Medicine

## 2014-06-23 VITALS — BP 154/90 | HR 82 | Ht 68.0 in | Wt 188.0 lb

## 2014-06-23 DIAGNOSIS — Z720 Tobacco use: Secondary | ICD-10-CM | POA: Diagnosis not present

## 2014-06-23 DIAGNOSIS — I2584 Coronary atherosclerosis due to calcified coronary lesion: Secondary | ICD-10-CM

## 2014-06-23 DIAGNOSIS — J449 Chronic obstructive pulmonary disease, unspecified: Secondary | ICD-10-CM | POA: Diagnosis not present

## 2014-06-23 DIAGNOSIS — R918 Other nonspecific abnormal finding of lung field: Secondary | ICD-10-CM | POA: Insufficient documentation

## 2014-06-23 DIAGNOSIS — C3490 Malignant neoplasm of unspecified part of unspecified bronchus or lung: Secondary | ICD-10-CM

## 2014-06-23 DIAGNOSIS — I2510 Atherosclerotic heart disease of native coronary artery without angina pectoris: Secondary | ICD-10-CM | POA: Diagnosis not present

## 2014-06-23 MED ORDER — TIOTROPIUM BROMIDE MONOHYDRATE 2.5 MCG/ACT IN AERS: 2.0000 | INHALATION_SPRAY | Freq: Every day | RESPIRATORY_TRACT | Status: DC

## 2014-06-23 NOTE — Progress Notes (Signed)
I saw this patient with Dr. Lamonte Sakai . Dr. Royston Cowper explained the results of the CT chest and the PET scan to Steve Baker. Additionally I had the patient's wife Steve Baker come into the exam room to explain the plan of care with her, and allow her to ask any questions she may have.I explained the concerns we have regarding the RUL nodule and the smaller LLL nodule, and the necessity to biopsy the nodes for a diagnosis. Both the patient and his wife understand that we are concerned about a possible cancer diagnosis. The plan is to get a cards consult, PFT's, a super D CT chest, and if cleared bronch and biopsies under general anesthesia.The patient and I have contracted verbally  that he will try to decrease his cigarette use from 1.5 packs per day to 0.5 packs per day to allow his lungs to be in the best shape possible to undergo the bronch procedure.I asked him to call me if he needs  medication to help in this process, and provided him with my business card and contact information.I offered both the patient and his wife support and encouraged them to call me for any questions they may have after they get home tonight or in the days to come.They both verbalized understanding of the plan of care, and are expecting calls with appointments for pulmonary function tests, a CT scan, and a cardiac consult.Steve Baker was instructed on use of Spiriva inhaler by the nursing staff before leaving the office.

## 2014-06-23 NOTE — Progress Notes (Signed)
Subjective:    Patient ID: Steve Baker, male    DOB: Dec 23, 1940, 74 y.o.   MRN: [id number]  HPI 74 year old active smoker (35 pk-yrs) with history of CAD/CABG, abdominal aneurysm repair, hypertension, prostate CA (2001), hyperlipidemia. He underwent a screening CT scan of the chest 06/04/14 ordered by Steve Baker that showed a dominant right upper lobe mass and a smaller left lower lobe nodule. He was evaluated in the lung cancer screening program and a PET scan has been obtained 06/18/14 > both lesions are hypermetabolic, as was a slightly enlarged right peritracheal node. There was evidence of pharyngeal esophageal activity without a corresponding anatomical abnormality on CT scan.    He denies any hemoptysis although he has had frequent cough. He has dyspnea that appears to be stable. He is not on any bronchodilators at this time. He has about 6 pounds of unintentional weight loss over the last month.   Review of Systems As per HPI   Past Medical History  Diagnosis Date  . Hypertension   . Hyperlipidemia   . Cancer      No family history on file.   History   Social History  . Marital Status: Married    Spouse Name: N/A  . Number of Children: N/A  . Years of Education: N/A   Occupational History  . Not on file.   Social History Main Topics  . Smoking status: Current Every Day Smoker -- 1.50 packs/day for 56 years    Types: Cigarettes  . Smokeless tobacco: Not on file  . Alcohol Use: Not on file  . Drug Use: Not on file  . Sexual Activity: Not on file   Other Topics Concern  . 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   No facility-administered medications prior to visit.       Objective:   Physical Exam Filed Vitals:   06/23/14 1157 06/23/14 1158  BP:  154/90  Pulse:  82  Height: '5\' 8"'$  (1.727 m)    Weight: 188 lb (85.276 kg)   SpO2:  96%   Gen: Pleasant, well-nourished, in no distress,  normal affect  ENT: No lesions,  Tongue is blackened with cigarette tar,  no postnasal drip  Neck: No JVD, no TMG, no carotid bruits  Lungs: No use of accessory muscles,  clear without rales or rhonchi  Cardiovascular: RRR, heart sounds normal, no murmur or gallops, no peripheral edema  Musculoskeletal: No deformities, no cyanosis or clubbing. He does have nicotine staining on his nails  Neuro: alert, non focal  Skin: Warm, no lesions or rashes      Assessment & Plan:  Tobacco abuse We discussed the importance of cutting down today. He does have an ultimate goal of stopping completely. We will set a reasonable achievable goal for him to work on preoperatively. I asked him to discuss this with Steve Form NP, our lung cancer screening program navigator. If he needs medications to help with this including nicotine replacement and we will arrange for it.   COPD (chronic obstructive pulmonary disease) He has presumed COPD based on his symptoms and exam today. He has not yet had pulmonary function testing but I would like to start him on empiric therapy to see if he benefits. We will arrange for PFT in the future. We  will start Spiriva and follow him for clinical response   Pulmonary nodules He has a large right upper lobe mass and a smaller left lower lobe nodule both of which are hypermetabolic on PET scan. Certainly this is concerning for metastatic disease but the appearance could also be consistent with 2 synchronous primaries especially in absence of significant mediastinal lymphadenopathy. He does have a history of prostate cancer but this was 15 years ago. I think that it is unlikely that this is metastatic prostate cancer. I believe it would be reasonable to attempt biopsy of both lesions in order to assess whether he is stage IV versus a lower stage. Certainly this would impact his therapy.  Before undergoing bronchoscopy with ENB I  believe he will need to have cardiac risk stratification   CAD (coronary artery disease) Coronary artery disease with a history of CABG. He will need to see cardiology to decide if he needs stress test before undergoing general anesthesia. He's been seen in the past by Dr. Peter Baker

## 2014-06-23 NOTE — Assessment & Plan Note (Signed)
We discussed the importance of cutting down today. He does have an ultimate goal of stopping completely. We will set a reasonable achievable goal for him to work on preoperatively. I asked him to discuss this with Eric Form NP, our lung cancer screening program navigator. If he needs medications to help with this including nicotine replacement and we will arrange for it.

## 2014-06-23 NOTE — Assessment & Plan Note (Signed)
He has presumed COPD based on his symptoms and exam today. He has not yet had pulmonary function testing but I would like to start him on empiric therapy to see if he benefits. We will arrange for PFT in the future. We will start Spiriva and follow him for clinical response

## 2014-06-23 NOTE — Assessment & Plan Note (Signed)
He has a large right upper lobe mass and a smaller left lower lobe nodule both of which are hypermetabolic on PET scan. Certainly this is concerning for metastatic disease but the appearance could also be consistent with 2 synchronous primaries especially in absence of significant mediastinal lymphadenopathy. He does have a history of prostate cancer but this was 15 years ago. I think that it is unlikely that this is metastatic prostate cancer. I believe it would be reasonable to attempt biopsy of both lesions in order to assess whether he is stage IV versus a lower stage. Certainly this would impact his therapy. Before undergoing bronchoscopy with ENB I  believe he will need to have cardiac risk stratification

## 2014-06-23 NOTE — Assessment & Plan Note (Signed)
Coronary artery disease with a history of CABG. He will need to see cardiology to decide if he needs stress test before undergoing general anesthesia. He's been seen in the past by Dr. Peter Martinique

## 2014-06-23 NOTE — Patient Instructions (Addendum)
We will start Spiriva 2 puffs once a day We will refer you to see Dr. Martinique or one of his partners with cardiology in preparation for general anesthesia We will repeat your CT scan of the chest in preparation for possible biopsy We will arrange for a bronchoscopy to biopsy your right upper lobe and left lower lobe nodules Work hard on setting a goal for decreasing your cigarettes. We will help you with this in any way possible Follow with Dr Lamonte Sakai in 1 month

## 2014-06-29 ENCOUNTER — Ambulatory Visit (INDEPENDENT_AMBULATORY_CARE_PROVIDER_SITE_OTHER)
Admission: RE | Admit: 2014-06-29 | Discharge: 2014-06-29 | Disposition: A | Discharge status: Home / Self Care | Payer: Medicare Other | Source: Ambulatory Visit | Attending: Emergency Medicine | Admitting: Emergency Medicine

## 2014-06-29 DIAGNOSIS — C3490 Malignant neoplasm of unspecified part of unspecified bronchus or lung: Secondary | ICD-10-CM

## 2014-07-02 NOTE — Pre-Procedure Instructions (Signed)
Steve Baker  07/02/2014   Your procedure is scheduled on:  Wed, May 4 @ 8:30 AM  Report to [facility] and go to Admitting at 6:30 AM.  Call this number if you have problems the morning of surgery: [phone]   Remember:   Do not eat food or drink liquids after midnight.   Take these medicines the morning of surgery with A SIP OF WATER: Spiriva(Tiotropium(               Stop taking your Aspirin as you have been instructed. No Goody's,BC's,Aleve,Ibuprofen,Fish Oil,or any Herbal Medications.   Do not wear jewelry.  Do not wear lotions, powders, or colognes. You may wear deodorant.  Men may shave face and neck.  Do not bring valuables to the hospital.  [facility] is not responsible                  for any belongings or valuables.               Contacts, dentures or bridgework may not be worn into surgery.  Leave suitcase in the car. After surgery it may be brought to your room.  For patients admitted to the hospital, discharge time is determined by your                treatment team.               Patients discharged the day of surgery will not be allowed to drive  home.    Special Instructions:  [facility] - Preparing for Surgery  Before surgery, you can play an important role.  Because skin is not sterile, your skin needs to be as free of germs as possible.  You can reduce the number of germs on you skin by washing with CHG (chlorahexidine gluconate) soap before surgery.  CHG is an antiseptic cleaner which kills germs and bonds with the skin to continue killing germs even after washing.  Please DO NOT use if you have an allergy to CHG or antibacterial soaps.  If your skin becomes reddened/irritated stop using the CHG and inform your nurse when you arrive at Short Stay.  Do not shave (including legs and underarms) for at least 48 hours prior to the first CHG shower.  You may shave your face.  Please follow these instructions carefully:   1.  Shower with CHG Soap the  night before surgery and the                                morning of Surgery.  2.  If you choose to wash your hair, wash your hair first as usual with your       normal shampoo.  3.  After you shampoo, rinse your hair and body thoroughly to remove the                      Shampoo.  4.  Use CHG as you would any other liquid soap.  You can apply chg directly       to the skin and wash gently with scrungie or a clean washcloth.  5.  Apply the CHG Soap to your body ONLY FROM THE NECK DOWN.        Do not use on open wounds or open sores.  Avoid contact with your eyes,       ears, mouth and  genitals (private parts).  Wash genitals (private parts)       with your normal soap.  6.  Wash thoroughly, paying special attention to the area where your surgery        will be performed.  7.  Thoroughly rinse your body with warm water from the neck down.  8.  DO NOT shower/wash with your normal soap after using and rinsing off       the CHG Soap.  9.  Pat yourself dry with a clean towel.            10.  Wear clean pajamas.            11.  Place clean sheets on your bed the night of your first shower and do not        sleep with pets.  Day of Surgery  Do not apply any lotions/deoderants the morning of surgery.  Please wear clean clothes to the hospital/surgery center.     Please read over the following fact sheets that you were given: Pain Booklet, Coughing and Deep Breathing and Surgical Site Infection Prevention

## 2014-07-03 ENCOUNTER — Encounter (HOSPITAL_COMMUNITY): Payer: Self-pay

## 2014-07-03 ENCOUNTER — Encounter (HOSPITAL_COMMUNITY)
Admission: RE | Admit: 2014-07-03 | Discharge: 2014-07-03 | Disposition: A | Discharge status: Home / Self Care | Payer: Medicare Other | Source: Ambulatory Visit | Attending: Emergency Medicine | Admitting: Emergency Medicine

## 2014-07-03 DIAGNOSIS — Z01810 Encounter for preprocedural cardiovascular examination: Secondary | ICD-10-CM | POA: Diagnosis not present

## 2014-07-03 DIAGNOSIS — Z01818 Encounter for other preprocedural examination: Secondary | ICD-10-CM | POA: Diagnosis not present

## 2014-07-03 DIAGNOSIS — I2510 Atherosclerotic heart disease of native coronary artery without angina pectoris: Secondary | ICD-10-CM | POA: Diagnosis not present

## 2014-07-03 DIAGNOSIS — Z01812 Encounter for preprocedural laboratory examination: Secondary | ICD-10-CM | POA: Diagnosis not present

## 2014-07-03 HISTORY — DX: Reserved for inherently not codable concepts without codable children: IMO0001

## 2014-07-03 LAB — CBC
HCT: 33.1 % — ABNORMAL LOW (ref 39.0–52.0)
Hemoglobin: 9.1 g/dL — ABNORMAL LOW (ref 13.0–17.0)
MCH: 18.5 pg — ABNORMAL LOW (ref 26.0–34.0)
MCHC: 27.5 g/dL — ABNORMAL LOW (ref 30.0–36.0)
MCV: 67.4 fL — AB (ref 78.0–100.0)
Platelets: 299 10*3/uL (ref 150–400)
RBC: 4.91 MIL/uL (ref 4.22–5.81)
RDW: 20.2 % — ABNORMAL HIGH (ref 11.5–15.5)
WBC: 4.1 10*3/uL (ref 4.0–10.5)

## 2014-07-03 LAB — BASIC METABOLIC PANEL
ANION GAP: 8 (ref 5–15)
BUN: 12 mg/dL (ref 6–23)
CHLORIDE: 101 mmol/L (ref 96–112)
CO2: 26 mmol/L (ref 19–32)
CREATININE: 1.04 mg/dL (ref 0.50–1.35)
Calcium: 8.9 mg/dL (ref 8.4–10.5)
GFR calc Af Amer: 80 mL/min — ABNORMAL LOW (ref >90)
GFR calc non Af Amer: 69 mL/min — ABNORMAL LOW (ref >90)
GLUCOSE: 85 mg/dL (ref 70–99)
Potassium: 4.4 mmol/L (ref 3.5–5.1)
Sodium: 135 mmol/L (ref 135–145)

## 2014-07-03 NOTE — Progress Notes (Signed)
PCP is Leonard Downing. Patient is scheduled to see Dr. Fransico Him (his new Cardiologist) on Monday May 2nd at 1330. Patient denied having any acute cardiac or pulmonary issues.

## 2014-07-06 ENCOUNTER — Other Ambulatory Visit (HOSPITAL_COMMUNITY): Payer: Self-pay | Admitting: Pediatrics

## 2014-07-06 ENCOUNTER — Encounter (HOSPITAL_COMMUNITY): Payer: Self-pay | Admitting: Emergency Medicine

## 2014-07-06 ENCOUNTER — Other Ambulatory Visit: Payer: Self-pay | Admitting: Cardiology

## 2014-07-06 ENCOUNTER — Ambulatory Visit (INDEPENDENT_AMBULATORY_CARE_PROVIDER_SITE_OTHER): Payer: Medicare Other | Admitting: Cardiology

## 2014-07-06 ENCOUNTER — Telehealth: Payer: Self-pay | Admitting: Emergency Medicine

## 2014-07-06 ENCOUNTER — Telehealth: Payer: Self-pay | Admitting: Cardiology

## 2014-07-06 ENCOUNTER — Encounter: Payer: Self-pay | Admitting: Cardiology

## 2014-07-06 ENCOUNTER — Ambulatory Visit (HOSPITAL_COMMUNITY)
Admission: RE | Admit: 2014-07-06 | Discharge: 2014-07-06 | Disposition: A | Discharge status: Home / Self Care | Payer: Medicare Other | Source: Ambulatory Visit | Attending: Family Medicine | Admitting: Family Medicine

## 2014-07-06 VITALS — BP 130/72 | HR 93 | Ht 68.0 in | Wt 193.6 lb

## 2014-07-06 DIAGNOSIS — I739 Peripheral vascular disease, unspecified: Secondary | ICD-10-CM

## 2014-07-06 DIAGNOSIS — I10 Essential (primary) hypertension: Secondary | ICD-10-CM | POA: Diagnosis not present

## 2014-07-06 DIAGNOSIS — C3491 Malignant neoplasm of unspecified part of right bronchus or lung: Secondary | ICD-10-CM | POA: Insufficient documentation

## 2014-07-06 DIAGNOSIS — Z01810 Encounter for preprocedural cardiovascular examination: Secondary | ICD-10-CM | POA: Insufficient documentation

## 2014-07-06 DIAGNOSIS — Z720 Tobacco use: Secondary | ICD-10-CM | POA: Diagnosis not present

## 2014-07-06 DIAGNOSIS — E785 Hyperlipidemia, unspecified: Secondary | ICD-10-CM | POA: Diagnosis not present

## 2014-07-06 DIAGNOSIS — Z01818 Encounter for other preprocedural examination: Secondary | ICD-10-CM | POA: Diagnosis not present

## 2014-07-06 DIAGNOSIS — I2510 Atherosclerotic heart disease of native coronary artery without angina pectoris: Secondary | ICD-10-CM | POA: Diagnosis not present

## 2014-07-06 DIAGNOSIS — R918 Other nonspecific abnormal finding of lung field: Secondary | ICD-10-CM

## 2014-07-06 DIAGNOSIS — Z01812 Encounter for preprocedural laboratory examination: Secondary | ICD-10-CM | POA: Insufficient documentation

## 2014-07-06 NOTE — Telephone Encounter (Signed)
New message     Mindi calling from [facility] returning your call from today.     Please ask for triage when you call back

## 2014-07-06 NOTE — Telephone Encounter (Signed)
Levada Dy, NP at hospital is calling. She wanted to make sure RB was aware bronch needed to be resched & also wants to make sure RB know pt is anemic & hgb is 9.1.  Ph # [phone]

## 2014-07-06 NOTE — Telephone Encounter (Signed)
ENB has been canceled. Spoke with pt's wife. They will contact us once all testing is done.

## 2014-07-06 NOTE — Telephone Encounter (Signed)
He is scheduled for ENB on Wednesday at [facility]. Will need to call the OR to cancel, then make sure he sees me or calls me after the stress test so we can reschedule the ENB. Thanks,.

## 2014-07-06 NOTE — Telephone Encounter (Signed)
Spoke with [employer]. Pt came in for his appointment to be cleared for bronch. Dr. Radford Pax wanted further testing done before she could clear him. A stress test and echo will be performed on Wednesday. Will route this message to RB so he will be aware.  I called respiratory and they do not have pt scheduled for a bronch yet.

## 2014-07-06 NOTE — Telephone Encounter (Signed)
Called and [facility] for [employer]

## 2014-07-06 NOTE — Patient Instructions (Signed)
Medication Instructions:  Your physician recommends that you continue on your current medications as directed. Please refer to the Current Medication list given to you today.   Labwork: NONE today.  Testing/Procedures: Your physician has requested that you have a [place]. For further information please visit [email or web address]. Please follow instruction sheet, as given.  Your physician has requested that you have an echocardiogram TOMORROW. Echocardiography is a painless test that uses sound waves to create images of your heart. It provides your doctor with information about the size and shape of your heart and how well your heart's chambers and valves are working. This procedure takes approximately one hour. There are no restrictions for this procedure.  Dr. Radford Pax recommends you have LOWER EXTREMITY ARTERIAL DOPPLERS.     Follow-Up: Your physician wants you to follow-up in: 6 months with Dr. Radford Pax. You will receive a reminder letter in the mail two months in advance. If you don't receive a letter, please call our office to schedule the follow-up appointment.   Any Other Special Instructions Will Be Listed Below (If Applicable).

## 2014-07-06 NOTE — Addendum Note (Signed)
Addended by: Harland German A on: 07/06/2014 10:37 AM   Modules accepted: Orders

## 2014-07-06 NOTE — Progress Notes (Signed)
Cardiology Office Note   Date:  07/06/2014   ID:  Steve Baker, DOB Sep 01, 1940, MRN [id number]  PCP:  Leonard Downing, MD    Chief Complaint  Patient presents with  . Coronary Artery Disease  . Hypertension  . Hyperlipidemia      History of Present Illness: 74 year old active smoker (29 pk-yrs) with history of CAD by cath 2001 with subsequentCABG, abdominal aneurysm repair, hypertension, prostate CA (2001), hyperlipidemia. He underwent a screening CT scan of the chest 06/04/14 ordered by Dr. Arelia Sneddon that showed a dominant right upper lobe mass and a smaller left lower lobe nodule. He was evaluated in the lung cancer screening program and a PET scan has been obtained 06/18/14 > both lesions are hypermetabolic, as was a slightly enlarged right peritracheal node. There was evidence of pharyngeal esophageal activity without a corresponding anatomical abnormality on CT scan. He denies any hemoptysis although he has had frequent cough. He has dyspnea that appears to be stable. He is not on any bronchodilators at this time. He has about 6 pounds of unintentional weight loss over the last month. He is now referred to Cardiology for preoperative evaluation.  He used to see Dr. Martinique but has not seen him in 10 years.  He denies any chest pain but has chronic DOE that has slowly worsened.  He has chronic LE edema for several years and has no changed any.  He denies any palpitations, dizziness or syncope.   Past Medical History  Diagnosis Date  . Hypertension   . Hyperlipidemia   . Cancer     prostate; seed insertion  . Irregular heart beat     Pt stated his heart skips a beat every now and then  . Shortness of breath dyspnea     with ambulation    Past Surgical History  Procedure Laterality Date  . Cholecystectomy    . Heart bypass    . Cardiac catheterization  2001  . Skin cancer excision N/A     Nose  . Abdominal aortic aneurysm repair    . Eye surgery Bilateral     cataract  removal  . Colonoscopy w/ polypectomy       Current Outpatient Prescriptions  Medication Sig Dispense Refill  . aspirin EC 81 MG tablet Take 81 mg by mouth daily.    Marland Kitchen lisinopril (PRINIVIL,ZESTRIL) 20 MG tablet Take 20 mg by mouth daily.    . pravastatin (PRAVACHOL) 40 MG tablet Take 40 mg by mouth daily.    . Tiotropium Bromide Monohydrate (SPIRIVA RESPIMAT) 2.5 MCG/ACT AERS Inhale 2 puffs into the lungs daily. 1 Inhaler 4   No current facility-administered medications for this visit.    Allergies:   Review of patient's allergies indicates no known allergies.    Social History:  The patient  reports that he has been smoking Cigarettes.  He has a 112 pack-year smoking history. He does not have any smokeless tobacco history on file. He reports that he drinks about 1.8 oz of alcohol per week. He reports that he does not use illicit drugs.   Family History:  The patient's family history includes Heart Problems in his mother; Heart attack in his father.    ROS:  Please see the history of present illness.   Otherwise, review of systems are positive for none.   All other systems are reviewed and negative.    PHYSICAL EXAM: VS:  BP 130/72 mmHg  Pulse 93  Ht '5\' 8"'$  (1.727 m)  Wt 193 lb 9.6 oz (87.816 kg)  BMI 29.44 kg/m2  SpO2 99% , BMI Body mass index is 29.44 kg/(m^2). GEN: Well nourished, well developed, in no acute distress HEENT: normal Neck: no JVD, carotid bruits, or masses Cardiac: RRR; no murmurs, rubs, or gallops,no edema  Respiratory:  clear to auscultation bilaterally, normal work of breathing GI: soft, nontender, nondistended, + BS MS: no deformity or atrophy Skin: warm and dry, no rash Neuro:  Strength and sensation are intact Psych: euthymic mood, full affect   EKG:  EKG is not ordered today.    Recent Labs: 07/03/2014: BUN 12; Creatinine 1.04; Hemoglobin 9.1*; Platelets 299; Potassium 4.4; Sodium 135    Lipid Panel No results found for: CHOL, TRIG, HDL,  CHOLHDL, VLDL, LDLCALC, LDLDIRECT    Wt Readings from Last 3 Encounters:  07/06/14 193 lb 9.6 oz (87.816 kg)  06/23/14 188 lb (85.276 kg)        ASSESSMENT AND PLAN:  1.  ASCAD 3 vesel s/p CABG 2001.  He has no seen a Cardiologist in some time.  Continue ASA/statin 2.  HTN - controlled on Lisinopril 3.  Dyslipidemia followed by PCP on pravastatin 4.  AAA s/p repair 5.  RUL mass and LLL nodule with hypermetabolic lesions on PET  6.  Preoperative cardiac clearance.  He does not get much exercise due to leg pain with walking.  He cannot walk any further than 271f and he gets calf cramps.  He will need a Lexiscan myoview to rule out ischemia prior to surgery and 2D echo to assess LVF.   7.  Claudication - I will LE arterial dopplers to assess.     Current medicines are reviewed at length with the patient today.  The patient does not have concerns regarding medicines.  The following changes have been made:  no change  Labs/ tests ordered today include: see above assessment and plan No orders of the defined types were placed in this encounter.     Disposition:   FU with me in 6 months   Signed, TSueanne Margarita MD  07/06/2014 9:43 AM    [facility] [address] [place]  [id number]: [phone] Fax: [phone]

## 2014-07-06 NOTE — Addendum Note (Signed)
Addended by: Harland German A on: 07/06/2014 10:12 AM   Modules accepted: Orders

## 2014-07-06 NOTE — Progress Notes (Signed)
Echocardiogram 2D Echocardiogram has been performed.  Joelene Millin 07/06/2014, 2:40 PM

## 2014-07-06 NOTE — Telephone Encounter (Signed)
Called and spoke with Ria Comment, RN from pulmonary. Informed her that patient needs to have an ECHO and a stress test before he is cleared for his bronch on Wednesday. Testing cannot be scheduled prior to the procedure, so the procedure will have to be rescheduled for a later date and time. Instructed her to cancel the bronch so the myoview can be scheduled, as the patient cannot be double-booked.  She st she called respiratory at the hospital and the patient is not scheduled for any procedure, but can see the orders are still in EPIC. I called Central Scheduling to try to cancel so myoview could be scheduled. Patient taken off schedule and [facility] notified.

## 2014-07-08 ENCOUNTER — Telehealth: Payer: Self-pay | Admitting: Acute Care

## 2014-07-08 ENCOUNTER — Encounter (HOSPITAL_COMMUNITY)
Admission: RE | Admit: 2014-07-08 | Discharge: 2014-07-08 | Disposition: A | Discharge status: Home / Self Care | Payer: Medicare Other | Source: Ambulatory Visit | Attending: Cardiology | Admitting: Cardiology

## 2014-07-08 ENCOUNTER — Encounter (HOSPITAL_COMMUNITY): Admission: RE | Payer: Self-pay | Source: Ambulatory Visit

## 2014-07-08 ENCOUNTER — Ambulatory Visit (HOSPITAL_COMMUNITY)
Admission: RE | Admit: 2014-07-08 | Discharge: 2014-07-08 | Disposition: A | Discharge status: Home / Self Care | Payer: Medicare Other | Source: Ambulatory Visit | Attending: Cardiology | Admitting: Cardiology

## 2014-07-08 DIAGNOSIS — Z01818 Encounter for other preprocedural examination: Secondary | ICD-10-CM

## 2014-07-08 DIAGNOSIS — I2510 Atherosclerotic heart disease of native coronary artery without angina pectoris: Secondary | ICD-10-CM

## 2014-07-08 SURGERY — VIDEO BRONCHOSCOPY WITH ENDOBRONCHIAL NAVIGATION
Anesthesia: General

## 2014-07-08 MED ORDER — TECHNETIUM TC 99M SESTAMIBI GENERIC - CARDIOLITE
30.0000 | Freq: Once | INTRAVENOUS | Status: AC | PRN
  Administered 2014-07-08: 30 via INTRAVENOUS

## 2014-07-08 MED ORDER — REGADENOSON 0.4 MG/5ML IV SOLN
0.4000 mg | Freq: Once | INTRAVENOUS | Status: AC
  Administered 2014-07-08: 0.4 mg via INTRAVENOUS

## 2014-07-08 MED ORDER — TECHNETIUM TC 99M SESTAMIBI GENERIC - CARDIOLITE
10.0000 | Freq: Once | INTRAVENOUS | Status: AC | PRN
  Administered 2014-07-08: 10 via INTRAVENOUS

## 2014-07-08 MED ORDER — REGADENOSON 0.4 MG/5ML IV SOLN
INTRAVENOUS | Status: AC
  Filled 2014-07-08: qty 5

## 2014-07-08 NOTE — Telephone Encounter (Signed)
Mrs. Steve Baker called to let us know Mr. Steve Baker has had the stress test and echo for the cardiology work up he needs prior to bronch for biopsy of the two concerning lymph nodes.I have passed this information on to Dr. Lamonte Sakai. Upon clearance from cards we will re-schedule his bronch. Additionally Mrs. Steve Baker told me that Mr. Steve Baker thinks he may have cellulitis in his leg. I encouraged her to make an appointment with Dr. Arelia Sneddon, their PCP, to assess the leg and determine of Mr. Steve Baker requires treatment for cellulitis. She stated that she would do that today. We will await cards clearance for the bronch procedure.

## 2014-07-08 NOTE — Progress Notes (Signed)
The patient tolerated the [place] well.  Mckynzi Cammon, PAC

## 2014-07-09 ENCOUNTER — Telehealth: Payer: Self-pay

## 2014-07-09 DIAGNOSIS — R931 Abnormal findings on diagnostic imaging of heart and coronary circulation: Secondary | ICD-10-CM

## 2014-07-09 DIAGNOSIS — Z01812 Encounter for preprocedural laboratory examination: Secondary | ICD-10-CM

## 2014-07-09 LAB — NM MYOCAR MULTI W/SPECT W/WALL MOTION / EF
CHL CUP NUCLEAR SSS: 10
CHL CUP STRESS STAGE 1 SPEED: 0 mph
CHL CUP STRESS STAGE 2 SPEED: 0 mph
CHL CUP STRESS STAGE 3 DBP: 74 mmHg
CHL CUP STRESS STAGE 3 SBP: 139 mmHg
CSEPPHR: 96 {beats}/min
CSEPPMHR: 65 %
Estimated workload: 1 METS
LV dias vol: 225 mL
LV sys vol: 171 mL
NUC STRESS TID: 1.15
Nuc Stress EF: 24 %
Peak BP: 140 mmHg
RATE: 0.42
SDS: 4
SRS: 6
Stage 1 Grade: 0 %
Stage 1 HR: 105 {beats}/min
Stage 2 Grade: 0 %
Stage 2 HR: 105 {beats}/min
Stage 3 Grade: 0 %
Stage 3 HR: 104 {beats}/min
Stage 3 Speed: 0 mph
Stage 4 DBP: 73 mmHg
Stage 4 Grade: 0 %
Stage 4 HR: 96 {beats}/min
Stage 4 SBP: 140 mmHg
Stage 4 Speed: 0 mph

## 2014-07-09 NOTE — Telephone Encounter (Signed)
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 Await results of stress test  Notes Recorded by Sueanne Margarita, MD on 07/08/2014 at 9:17 PM Please let patient know that stress test was markedly abnormal. He needs a cath prior to surgery. Please get him in with extender to set up cath ASAP  Informed patient's wife of results and verbal understanding expressed.   OV scheduled with Richardson Dopp tomorrow. Cath scheduled for 07/16/14. Lab work to be completed tomorrow. Patient agrees with treatment plan.

## 2014-07-10 ENCOUNTER — Encounter: Payer: Self-pay | Admitting: Physician Assistant

## 2014-07-10 ENCOUNTER — Ambulatory Visit (INDEPENDENT_AMBULATORY_CARE_PROVIDER_SITE_OTHER): Payer: Medicare Other | Admitting: Physician Assistant

## 2014-07-10 ENCOUNTER — Other Ambulatory Visit (INDEPENDENT_AMBULATORY_CARE_PROVIDER_SITE_OTHER): Payer: Medicare Other | Admitting: *Deleted

## 2014-07-10 VITALS — BP 110/52 | HR 85 | Ht 68.0 in | Wt 193.0 lb

## 2014-07-10 DIAGNOSIS — Z01812 Encounter for preprocedural laboratory examination: Secondary | ICD-10-CM

## 2014-07-10 DIAGNOSIS — E785 Hyperlipidemia, unspecified: Secondary | ICD-10-CM | POA: Diagnosis not present

## 2014-07-10 DIAGNOSIS — Z720 Tobacco use: Secondary | ICD-10-CM | POA: Diagnosis not present

## 2014-07-10 DIAGNOSIS — I2510 Atherosclerotic heart disease of native coronary artery without angina pectoris: Secondary | ICD-10-CM | POA: Diagnosis not present

## 2014-07-10 DIAGNOSIS — J449 Chronic obstructive pulmonary disease, unspecified: Secondary | ICD-10-CM

## 2014-07-10 DIAGNOSIS — R931 Abnormal findings on diagnostic imaging of heart and coronary circulation: Secondary | ICD-10-CM

## 2014-07-10 DIAGNOSIS — R609 Edema, unspecified: Secondary | ICD-10-CM

## 2014-07-10 DIAGNOSIS — R918 Other nonspecific abnormal finding of lung field: Secondary | ICD-10-CM

## 2014-07-10 DIAGNOSIS — I10 Essential (primary) hypertension: Secondary | ICD-10-CM | POA: Diagnosis not present

## 2014-07-10 DIAGNOSIS — R9439 Abnormal result of other cardiovascular function study: Secondary | ICD-10-CM

## 2014-07-10 LAB — PROTIME-INR
INR: 1.1 ratio — ABNORMAL HIGH (ref 0.8–1.0)
Prothrombin Time: 12.6 s (ref 9.6–13.1)

## 2014-07-10 LAB — BASIC METABOLIC PANEL
BUN: 11 mg/dL (ref 6–23)
CO2: 28 mEq/L (ref 19–32)
Calcium: 9.2 mg/dL (ref 8.4–10.5)
Chloride: 105 mEq/L (ref 96–112)
Creatinine, Ser: 0.89 mg/dL (ref 0.40–1.50)
GFR: 88.74 mL/min (ref >60.00)
Glucose, Bld: 71 mg/dL (ref 70–99)
POTASSIUM: 4.7 meq/L (ref 3.5–5.1)
SODIUM: 136 meq/L (ref 135–145)

## 2014-07-10 LAB — CBC WITH DIFFERENTIAL/PLATELET
Basophils Absolute: 0 10*3/uL (ref 0.0–0.1)
Basophils Relative: 0.2 % (ref 0.0–3.0)
Eosinophils Absolute: 0.2 10*3/uL (ref 0.0–0.7)
Eosinophils Relative: 2.8 % (ref 0.0–5.0)
HEMATOCRIT: 30.6 % — AB (ref 39.0–52.0)
Hemoglobin: 9.2 g/dL — ABNORMAL LOW (ref 13.0–17.0)
LYMPHS ABS: 0.7 10*3/uL (ref 0.7–4.0)
Lymphocytes Relative: 12.3 % (ref 12.0–46.0)
MCHC: 30 g/dL (ref 30.0–36.0)
MONO ABS: 0.8 10*3/uL (ref 0.1–1.0)
Monocytes Relative: 14.5 % — ABNORMAL HIGH (ref 3.0–12.0)
NEUTROS ABS: 3.9 10*3/uL (ref 1.4–7.7)
Neutrophils Relative %: 70.2 % (ref 43.0–77.0)
Platelets: 335 10*3/uL (ref 150.0–400.0)
RBC: 4.76 Mil/uL (ref 4.22–5.81)
RDW: 22.2 % — ABNORMAL HIGH (ref 11.5–15.5)
WBC: 5.6 10*3/uL (ref 4.0–10.5)

## 2014-07-10 LAB — APTT: aPTT: 33 s — ABNORMAL HIGH (ref 23.4–32.7)

## 2014-07-10 NOTE — Telephone Encounter (Signed)
[place], [place]   07/10/2014 11:38 AM

## 2014-07-10 NOTE — Progress Notes (Signed)
Cardiology Office Note   Date:  07/10/2014   ID:  Steve Baker, DOB 02-Oct-1940, MRN [id number]  PCP:  Leonard Downing, MD  Cardiologist:  Dr. Fransico Him     Chief Complaint  Patient presents with  . Coronary Artery Disease    abnormal stress test     History of Present Illness: Steve Baker is a 74 y.o. male with a hx of CAD s/p CABG in 2001, AAA s/p repair, HTN, Prostate CA, HL, tobacco abuse.  He underwent a screening CT scan of the chest 06/04/14 ordered by Dr. Arelia Sneddon that showed a dominant right upper lobe mass and a smaller left lower lobe nodule. He was evaluated in the lung cancer screening program and a PET scan has been obtained 06/18/14 > both lesions are hypermetabolic, as was a slightly enlarged right peritracheal node. There was evidence of pharyngeal esophageal activity without a corresponding anatomical abnormality on CT scan. He was seen by Dr. Radford Pax 07/06/14 for preoperative cardiovascular evaluation. Lexiscan Myoview was obtained for risk stratification. Echocardiogram was also ordered.  Echocardiogram demonstrated reduced LV function with an EF of 40-45%, moderate pulmonary hypertension, moderate biatrial enlargement and mild MR. Stress testing was markedly abnormal with inferolateral scar with peri-infarct ischemia primarily of the mid anterolateral region, EF 24%. Dr. Radford Pax has recommended proceeding with cardiac catheterization. He returns to discuss stress test findings and upcoming cardiac catheterization.   Since last seen, he denies chest pain.  He has chronic DOE without change.  Also has chronic cough with clear sputum.  He is cutting back on his smoking.   He denies orthopnea, PND.  He has chronic LE edema (R > L).  His PCP recently gave him Lasix to take PRN.  He denies syncope.     Studies/Reports Reviewed Today:  Nm Myocar Multi W/spect W/wall Motion / Ef  07/09/2014     There was no ST segment deviation noted during stress.   Findings consistent with prior  myocardial infarction in the  inferolateral region with peri-infarct ischemia primarily of the mid  anterolateral region   The left ventricular ejection fraction is severely decreased (24%)   This is a high risk study mostly due to the severe LV dysfunction. He  has a hx of known CAD and a hx of CABG.     Echo 07/06/14  - Mild LVH. EF 40% to 45%. There is akinesis of the inferolateral myocardium. There is akinesis of the basalinferior myocardium.  Restrictive physiology,indicative of decreased left ventricular diastolic compliance and/or increased left atrial pressure.   - Aortic valve: There was trivial regurgitation. - Mitral valve: There was mild regurgitation. - Left atrium: The atrium was moderately dilated. - Right atrium: The atrium was moderately dilated. - PA peak pressure: 54 mm Hg (S).  Impressions:  Mild to moderate reduction in LV function; akinesis of thebasal/mild inferior and inferior lateral wall; restrictive LVfilling; biatrial enlargement; mild MR; trace AI; moderatelyincreased pulmonary pressure.   Past Medical History  Diagnosis Date  . Hypertension   . Hyperlipidemia   . Cancer     prostate; seed insertion  . Irregular heart beat     Pt stated his heart skips a beat every now and then  . Shortness of breath dyspnea     with ambulation    Past Surgical History  Procedure Laterality Date  . Cholecystectomy    . Heart bypass    . Cardiac catheterization  2001  . Skin cancer excision N/A  Nose  . Abdominal aortic aneurysm repair    . Eye surgery Bilateral     cataract removal  . Colonoscopy w/ polypectomy       Current Outpatient Prescriptions  Medication Sig Dispense Refill  . aspirin EC 81 MG tablet Take 81 mg by mouth daily.    Marland Kitchen lisinopril (PRINIVIL,ZESTRIL) 20 MG tablet Take 20 mg by mouth daily.    . pravastatin (PRAVACHOL) 40 MG tablet Take 40 mg by mouth daily.    . Tiotropium Bromide Monohydrate (SPIRIVA RESPIMAT) 2.5 MCG/ACT AERS Inhale  2 puffs into the lungs daily. 1 Inhaler 4   No current facility-administered medications for this visit.    Allergies:   Review of patient's allergies indicates no known allergies.    Social History:  The patient  reports that he has been smoking Cigarettes.  He has a 112 pack-year smoking history. He does not have any smokeless tobacco history on file. He reports that he drinks about 1.8 oz of alcohol per week. He reports that he does not use illicit drugs.   Family History:  The patient's family history includes Heart Problems in his mother; Heart attack in his father.    ROS:   Please see the history of present illness.   Review of Systems  Constitution: Negative for chills and fever.  Cardiovascular: Positive for claudication and leg swelling.  Respiratory: Positive for cough.   Gastrointestinal: Negative for diarrhea, hematochezia, melena and vomiting.  Genitourinary: Negative for hematuria.  All other systems reviewed and are negative.     PHYSICAL EXAM: VS:  There were no vitals taken for this visit.    Wt Readings from Last 3 Encounters:  07/06/14 193 lb 9.6 oz (87.816 kg)  06/23/14 188 lb (85.276 kg)     GEN: Well nourished, well developed, in no acute distress HEENT: normal Neck: no JVD, no carotid bruits, no masses Cardiac:  Normal S1/S2, RRR; no murmur ,  no rubs or gallops, 1-2+ bilateral LE edema (R>L) Respiratory:  clear to auscultation bilaterally, no wheezing, rhonchi or rales. GI: soft, nontender, nondistended, + BS MS: no deformity or atrophy Skin: warm and dry  Neuro:  CNs II-XII intact, Strength and sensation are intact Psych: Normal affect Vascular:  FA 2+ bilaterally, no FA bruits noted, DP/PT difficult to palpate   EKG:  EKG is ordered today.  It demonstrates:   NSR, HR 85, normal axis, IVCD, NSSTTW changes   Recent Labs: 07/03/2014: BUN 12; Creatinine 1.04; Hemoglobin 9.1*; Platelets 299; Potassium 4.4; Sodium 135    Lipid Panel No results  found for: CHOL, TRIG, HDL, CHOLHDL, VLDL, LDLCALC, LDLDIRECT    ASSESSMENT AND PLAN:  Coronary artery disease involving native coronary artery of native heart without angina pectoris  No angina.  Recent myoview high risk.  I reviewed the results of his stress test and echo with him today.  As noted. Dr. Fransico Him recommended proceeding with cardiac cath. Risks and benefits of cardiac catheterization have been discussed with the patient.  These include bleeding, infection, kidney damage, stroke, heart attack, death.  The patient understands these risks and is willing to proceed.  Procedure is planned for next week.  Continue ASA, statin, ACE inhibitor.  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  Continue ACE inhibitor.  No beta blocker given COPD.  May be able to consider Bystolic at some point.  Proceed with cath as noted.   Essential hypertension BP controlled on current regimen.  Hyperlipidemia Continue statin.  Tobacco abuse He is working on quitting.  Lung mass Surgical resection pending.  Chronic obstructive pulmonary disease, unspecified COPD, unspecified chronic bronchitis type Continue Spiriva.  Edema  Chronic. He takes Lasix prn.  Claudication LE arterial dopplers pending.    Current medicines are reviewed at length with the patient today.  Concerns regarding medicines are as outlined above.  The following changes have been made:    None    Labs/ tests ordered today include:   Orders Placed This Encounter  Procedures  . EKG 12-Lead    Disposition:   FU with Dr. Fransico Him after cardiac cath.    Signed, Versie Starks, MHS 07/10/2014 10:22 AM    [facility] [place], [place], [place]  [address] Phone: [phone]; Fax: [phone]

## 2014-07-10 NOTE — Patient Instructions (Signed)
Medication Instructions:  Your physician recommends that you continue on your current medications as directed. Please refer to the Current Medication list given to you today.   Labwork: TODAY BMET, CBC, PT/INR  Testing/Procedures: Your physician has requested that you have a cardiac catheterization. Cardiac catheterization is used to diagnose and/or treat various heart conditions. Doctors may recommend this procedure for a number of different reasons. The most common reason is to evaluate chest pain. Chest pain can be a symptom of coronary artery disease (CAD), and cardiac catheterization can show whether plaque is narrowing or blocking your heart's arteries. This procedure is also used to evaluate the valves, as well as measure the blood flow and oxygen levels in different parts of your heart. For further information please visit [facility]. Please follow instruction sheet, as given.  Follow-Up: 08/05/14 DR. TURNER AT 9 AM  Any Other Special Instructions Will Be Listed Below (If Applicable).

## 2014-07-16 ENCOUNTER — Encounter (HOSPITAL_COMMUNITY): Payer: Self-pay | Admitting: Cardiovascular Disease

## 2014-07-16 ENCOUNTER — Ambulatory Visit (HOSPITAL_COMMUNITY)
Admission: RE | Admit: 2014-07-16 | Discharge: 2014-07-16 | Disposition: A | Discharge status: Home / Self Care | Payer: Medicare Other | Source: Ambulatory Visit | Attending: Cardiovascular Disease | Admitting: Cardiovascular Disease

## 2014-07-16 ENCOUNTER — Encounter (HOSPITAL_COMMUNITY)
Admission: RE | Disposition: A | Discharge status: Home / Self Care | Payer: Medicare Other | Source: Ambulatory Visit | Attending: Cardiovascular Disease

## 2014-07-16 DIAGNOSIS — F17210 Nicotine dependence, cigarettes, uncomplicated: Secondary | ICD-10-CM | POA: Insufficient documentation

## 2014-07-16 DIAGNOSIS — I25810 Atherosclerosis of coronary artery bypass graft(s) without angina pectoris: Secondary | ICD-10-CM | POA: Insufficient documentation

## 2014-07-16 DIAGNOSIS — Z7982 Long term (current) use of aspirin: Secondary | ICD-10-CM | POA: Insufficient documentation

## 2014-07-16 DIAGNOSIS — E785 Hyperlipidemia, unspecified: Secondary | ICD-10-CM | POA: Insufficient documentation

## 2014-07-16 DIAGNOSIS — Z9861 Coronary angioplasty status: Secondary | ICD-10-CM | POA: Diagnosis not present

## 2014-07-16 DIAGNOSIS — Z8546 Personal history of malignant neoplasm of prostate: Secondary | ICD-10-CM | POA: Diagnosis not present

## 2014-07-16 DIAGNOSIS — Z85828 Personal history of other malignant neoplasm of skin: Secondary | ICD-10-CM | POA: Diagnosis not present

## 2014-07-16 DIAGNOSIS — I2582 Chronic total occlusion of coronary artery: Secondary | ICD-10-CM | POA: Insufficient documentation

## 2014-07-16 DIAGNOSIS — I10 Essential (primary) hypertension: Secondary | ICD-10-CM | POA: Diagnosis not present

## 2014-07-16 DIAGNOSIS — Z951 Presence of aortocoronary bypass graft: Secondary | ICD-10-CM | POA: Insufficient documentation

## 2014-07-16 DIAGNOSIS — I2510 Atherosclerotic heart disease of native coronary artery without angina pectoris: Secondary | ICD-10-CM | POA: Diagnosis not present

## 2014-07-16 DIAGNOSIS — Z9049 Acquired absence of other specified parts of digestive tract: Secondary | ICD-10-CM | POA: Diagnosis not present

## 2014-07-16 DIAGNOSIS — R9439 Abnormal result of other cardiovascular function study: Secondary | ICD-10-CM

## 2014-07-16 HISTORY — PX: CARDIAC CATHETERIZATION: SHX172

## 2014-07-16 LAB — POCT I-STAT 3, VENOUS BLOOD GAS (G3P V)
Acid-base deficit: 4 mmol/L — ABNORMAL HIGH (ref 0.0–2.0)
Bicarbonate: 21.1 mEq/L (ref 20.0–24.0)
O2 Saturation: 55 %
PH VEN: 7.361 — AB (ref 7.250–7.300)
PO2 VEN: 30 mmHg (ref 30.0–45.0)
TCO2: 22 mmol/L (ref 0–100)
pCO2, Ven: 37.3 mmHg — ABNORMAL LOW (ref 45.0–50.0)

## 2014-07-16 LAB — POCT I-STAT 3, ART BLOOD GAS (G3+)
ACID-BASE DEFICIT: 3 mmol/L — AB (ref 0.0–2.0)
Bicarbonate: 21.6 mEq/L (ref 20.0–24.0)
O2 SAT: 95 %
TCO2: 23 mmol/L (ref 0–100)
pCO2 arterial: 35.8 mmHg (ref 35.0–45.0)
pH, Arterial: 7.388 (ref 7.350–7.450)
pO2, Arterial: 74 mmHg — ABNORMAL LOW (ref 80.0–100.0)

## 2014-07-16 SURGERY — RIGHT/LEFT HEART CATH AND CORONARY ANGIOGRAPHY
Anesthesia: LOCAL

## 2014-07-16 MED ORDER — FENTANYL CITRATE (PF) 100 MCG/2ML IJ SOLN
INTRAMUSCULAR | Status: DC | PRN
  Administered 2014-07-16: 25 ug via INTRAVENOUS

## 2014-07-16 MED ORDER — GUAIFENESIN 100 MG/5ML PO SYRP
200.0000 mg | ORAL_SOLUTION | ORAL | Status: DC | PRN
  Administered 2014-07-16: 200 mg via ORAL
  Filled 2014-07-16 (×2): qty 10

## 2014-07-16 MED ORDER — LIDOCAINE HCL (PF) 1 % IJ SOLN
INTRAMUSCULAR | Status: AC
  Filled 2014-07-16: qty 30

## 2014-07-16 MED ORDER — SODIUM CHLORIDE 0.9 % IV SOLN: 250.0000 mL | INTRAVENOUS | Status: DC | PRN

## 2014-07-16 MED ORDER — SODIUM CHLORIDE 0.9 % WEIGHT BASED INFUSION: 1.0000 mL/kg/h | INTRAVENOUS | Status: AC

## 2014-07-16 MED ORDER — SODIUM CHLORIDE 0.9 % IJ SOLN: 3.0000 mL | INTRAMUSCULAR | Status: DC | PRN

## 2014-07-16 MED ORDER — ASPIRIN 81 MG PO CHEW: 81.0000 mg | CHEWABLE_TABLET | ORAL | Status: DC

## 2014-07-16 MED ORDER — IOHEXOL 350 MG/ML SOLN
INTRAVENOUS | Status: DC | PRN
  Administered 2014-07-16: 120 mL via INTRA_ARTERIAL

## 2014-07-16 MED ORDER — FENTANYL CITRATE (PF) 100 MCG/2ML IJ SOLN
INTRAMUSCULAR | Status: AC
  Filled 2014-07-16: qty 2

## 2014-07-16 MED ORDER — MIDAZOLAM HCL 2 MG/2ML IJ SOLN
INTRAMUSCULAR | Status: DC | PRN
  Administered 2014-07-16: 1 mg via INTRAVENOUS

## 2014-07-16 MED ORDER — SODIUM CHLORIDE 0.9 % IJ SOLN: 3.0000 mL | Freq: Two times a day (BID) | INTRAMUSCULAR | Status: DC

## 2014-07-16 MED ORDER — MIDAZOLAM HCL 2 MG/2ML IJ SOLN
INTRAMUSCULAR | Status: AC
  Filled 2014-07-16: qty 2

## 2014-07-16 MED ORDER — HEPARIN (PORCINE) IN NACL 2-0.9 UNIT/ML-% IJ SOLN
INTRAMUSCULAR | Status: AC
  Filled 2014-07-16: qty 1000

## 2014-07-16 MED ORDER — SODIUM CHLORIDE 0.9 % IV SOLN
INTRAVENOUS | Status: DC
  Administered 2014-07-16: 09:00:00 via INTRAVENOUS

## 2014-07-16 SURGICAL SUPPLY — 21 items
CATH BALLN WEDGE 5F 110CM (CATHETERS) IMPLANT
CATH INFINITI 5 FR JL3.5 (CATHETERS) IMPLANT
CATH INFINITI 5 FR LCB (CATHETERS) ×2 IMPLANT
CATH INFINITI 5FR AL1 (CATHETERS) ×2 IMPLANT
CATH INFINITI 5FR ANG PIGTAIL (CATHETERS) IMPLANT
CATH INFINITI 5FR MULTPACK ANG (CATHETERS) ×2 IMPLANT
CATH INFINITI JR4 5F (CATHETERS) IMPLANT
CATH SWAN GANZ 7F STRAIGHT (CATHETERS) ×2 IMPLANT
DEVICE RAD COMP TR BAND LRG (VASCULAR PRODUCTS) IMPLANT
GLIDESHEATH SLEND SS 6F .021 (SHEATH) IMPLANT
KIT HEART LEFT (KITS) ×2 IMPLANT
KIT HEART RIGHT NAMIC (KITS) ×2 IMPLANT
PACK CARDIAC CATHETERIZATION (CUSTOM PROCEDURE TRAY) ×2 IMPLANT
SHEATH FAST CATH BRACH 5F 5CM (SHEATH) IMPLANT
SHEATH PINNACLE 5F 10CM (SHEATH) ×2 IMPLANT
SHEATH PINNACLE 7F 10CM (SHEATH) ×2 IMPLANT
SYR MEDRAD MARK V 150ML (SYRINGE) ×2 IMPLANT
TRANSDUCER W/STOPCOCK (MISCELLANEOUS) ×2 IMPLANT
TUBING CIL FLEX 10 FLL-RA (TUBING) IMPLANT
WIRE EMERALD 3MM-J .035X150CM (WIRE) ×2 IMPLANT
WIRE SAFE-T 1.5MM-J .035X260CM (WIRE) IMPLANT

## 2014-07-16 NOTE — Interval H&P Note (Signed)
History and Physical Interval Note:  07/16/2014 10:06 AM  Steve Baker  has presented today for cardiac cath with the diagnosis of abnormal stress test, cardiomyopathy. The various methods of treatment have been discussed with the patient and family. After consideration of risks, benefits and other options for treatment, the patient has consented to  Procedure(s): Right/Left Heart Cath and Coronary Angiography (N/A) as a surgical intervention .  The patient's history has been reviewed, patient examined, no change in status, stable for surgery.  I have reviewed the patient's chart and labs.  Questions were answered to the patient's satisfaction.    Cath Lab Visit (complete for each Cath Lab visit)  Clinical Evaluation Leading to the Procedure:   ACS: No.  Non-ACS:    Anginal Classification: No Symptoms  Anti-ischemic medical therapy: No Therapy  Non-Invasive Test Results: High-risk stress test findings: cardiac mortality >3%/year  Prior CABG: Previous CABG         MCALHANY,CHRISTOPHER

## 2014-07-16 NOTE — Progress Notes (Signed)
Site area: right groin a 5 french arterial sheath was removed  Site Prior to Removal:  Level 0  Pressure Applied For 20 MINUTES    Minutes Beginning at 1130a  Manual:   Yes.    Patient Status During Pull:  stable  Post Pull Groin Site:  Level 0  Post Pull Instructions Given:  Yes.    Post Pull Pulses Present:  Yes.    Dressing Applied:  Yes.    Comments:  VS remain stable during sheath pull.  Pt denies any discomfort at this time

## 2014-07-16 NOTE — Discharge Instructions (Signed)

## 2014-07-16 NOTE — H&P (View-Only) (Signed)
Cardiology Office Note   Date:  07/10/2014   ID:  Steve Baker, DOB [birth year], MRN [id number]  PCP:  Leonard Downing, MD  Cardiologist:  Dr. Fransico Him     Chief Complaint  Patient presents with  . Coronary Artery Disease    abnormal stress test     History of Present Illness: Steve Baker is a 74 y.o. male with a hx of CAD s/p CABG in 2001, AAA s/p repair, HTN, Prostate CA, HL, tobacco abuse.  He underwent a screening CT scan of the chest 06/04/14 ordered by Dr. Arelia Sneddon that showed a dominant right upper lobe mass and a smaller left lower lobe nodule. He was evaluated in the lung cancer screening program and a PET scan has been obtained 06/18/14 > both lesions are hypermetabolic, as was a slightly enlarged right peritracheal node. There was evidence of pharyngeal esophageal activity without a corresponding anatomical abnormality on CT scan. He was seen by Dr. Radford Pax 07/06/14 for preoperative cardiovascular evaluation. Lexiscan Myoview was obtained for risk stratification. Echocardiogram was also ordered.  Echocardiogram demonstrated reduced LV function with an EF of 40-45%, moderate pulmonary hypertension, moderate biatrial enlargement and mild MR. Stress testing was markedly abnormal with inferolateral scar with peri-infarct ischemia primarily of the mid anterolateral region, EF 24%. Dr. Radford Pax has recommended proceeding with cardiac catheterization. He returns to discuss stress test findings and upcoming cardiac catheterization.   Since last seen, he denies chest pain.  He has chronic DOE without change.  Also has chronic cough with clear sputum.  He is cutting back on his smoking.   He denies orthopnea, PND.  He has chronic LE edema (R > L).  His PCP recently gave him Lasix to take PRN.  He denies syncope.     Studies/Reports Reviewed Today:  Nm Myocar Multi W/spect W/wall Motion / Ef  07/09/2014     There was no ST segment deviation noted during stress.   Findings consistent with prior  myocardial infarction in the  inferolateral region with peri-infarct ischemia primarily of the mid  anterolateral region   The left ventricular ejection fraction is severely decreased (24%)   This is a high risk study mostly due to the severe LV dysfunction. He  has a hx of known CAD and a hx of CABG.     Echo 07/06/14  - Mild LVH. EF 40% to 45%. There is akinesis of the inferolateral myocardium. There is akinesis of the basalinferior myocardium.  Restrictive physiology,indicative of decreased left ventricular diastolic compliance and/or increased left atrial pressure.   - Aortic valve: There was trivial regurgitation. - Mitral valve: There was mild regurgitation. - Left atrium: The atrium was moderately dilated. - Right atrium: The atrium was moderately dilated. - PA peak pressure: 54 mm Hg (S).  Impressions:  Mild to moderate reduction in LV function; akinesis of thebasal/mild inferior and inferior lateral wall; restrictive LVfilling; biatrial enlargement; mild MR; trace AI; moderatelyincreased pulmonary pressure.   Past Medical History  Diagnosis Date  . Hypertension   . Hyperlipidemia   . Cancer     prostate; seed insertion  . Irregular heart beat     Pt stated his heart skips a beat every now and then  . Shortness of breath dyspnea     with ambulation    Past Surgical History  Procedure Laterality Date  . Cholecystectomy    . Heart bypass    . Cardiac catheterization  2001  . Skin cancer excision N/A  Nose  . Abdominal aortic aneurysm repair    . Eye surgery Bilateral     cataract removal  . Colonoscopy w/ polypectomy       Current Outpatient Prescriptions  Medication Sig Dispense Refill  . aspirin EC 81 MG tablet Take 81 mg by mouth daily.    Marland Kitchen lisinopril (PRINIVIL,ZESTRIL) 20 MG tablet Take 20 mg by mouth daily.    . pravastatin (PRAVACHOL) 40 MG tablet Take 40 mg by mouth daily.    . Tiotropium Bromide Monohydrate (SPIRIVA RESPIMAT) 2.5 MCG/ACT AERS Inhale  2 puffs into the lungs daily. 1 Inhaler 4   No current facility-administered medications for this visit.    Allergies:   Review of patient's allergies indicates no known allergies.    Social History:  The patient  reports that he has been smoking Cigarettes.  He has a 112 pack-year smoking history. He does not have any smokeless tobacco history on file. He reports that he drinks about 1.8 oz of alcohol per week. He reports that he does not use illicit drugs.   Family History:  The patient's family history includes Heart Problems in his mother; Heart attack in his father.    ROS:   Please see the history of present illness.   Review of Systems  Constitution: Negative for chills and fever.  Cardiovascular: Positive for claudication and leg swelling.  Respiratory: Positive for cough.   Gastrointestinal: Negative for diarrhea, hematochezia, melena and vomiting.  Genitourinary: Negative for hematuria.  All other systems reviewed and are negative.     PHYSICAL EXAM: VS:  There were no vitals taken for this visit.    Wt Readings from Last 3 Encounters:  07/06/14 193 lb 9.6 oz (87.816 kg)  06/23/14 188 lb (85.276 kg)     GEN: Well nourished, well developed, in no acute distress HEENT: normal Neck: no JVD, no carotid bruits, no masses Cardiac:  Normal S1/S2, RRR; no murmur ,  no rubs or gallops, 1-2+ bilateral LE edema (R>L) Respiratory:  clear to auscultation bilaterally, no wheezing, rhonchi or rales. GI: soft, nontender, nondistended, + BS MS: no deformity or atrophy Skin: warm and dry  Neuro:  CNs II-XII intact, Strength and sensation are intact Psych: Normal affect Vascular:  FA 2+ bilaterally, no FA bruits noted, DP/PT difficult to palpate   EKG:  EKG is ordered today.  It demonstrates:   NSR, HR 85, normal axis, IVCD, NSSTTW changes   Recent Labs: 07/03/2014: BUN 12; Creatinine 1.04; Hemoglobin 9.1*; Platelets 299; Potassium 4.4; Sodium 135    Lipid Panel No results  found for: CHOL, TRIG, HDL, CHOLHDL, VLDL, LDLCALC, LDLDIRECT    ASSESSMENT AND PLAN:  Coronary artery disease involving native coronary artery of native heart without angina pectoris  No angina.  Recent myoview high risk.  I reviewed the results of his stress test and echo with him today.  As noted. Dr. Fransico Him recommended proceeding with cardiac cath. Risks and benefits of cardiac catheterization have been discussed with the patient.  These include bleeding, infection, kidney damage, stroke, heart attack, death.  The patient understands these risks and is willing to proceed.  Procedure is planned for next week.  Continue ASA, statin, ACE inhibitor.  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  Continue ACE inhibitor.  No beta blocker given COPD.  May be able to consider Bystolic at some point.  Proceed with cath as noted.   Essential hypertension BP controlled on current regimen.  Hyperlipidemia Continue statin.  Tobacco abuse He is working on quitting.  Lung mass Surgical resection pending.  Chronic obstructive pulmonary disease, unspecified COPD, unspecified chronic bronchitis type Continue Spiriva.  Edema  Chronic. He takes Lasix prn.  Claudication LE arterial dopplers pending.    Current medicines are reviewed at length with the patient today.  Concerns regarding medicines are as outlined above.  The following changes have been made:    None    Labs/ tests ordered today include:   Orders Placed This Encounter  Procedures  . EKG 12-Lead    Disposition:   FU with Dr. Fransico Him after cardiac cath.    Signed, Versie Starks, MHS 07/10/2014 10:22 AM    [facility] [place], [place],   [address] Phone: [phone]; Fax: [phone]

## 2014-07-17 ENCOUNTER — Other Ambulatory Visit: Payer: Self-pay

## 2014-07-17 ENCOUNTER — Telehealth: Payer: Self-pay | Admitting: Emergency Medicine

## 2014-07-17 DIAGNOSIS — I739 Peripheral vascular disease, unspecified: Secondary | ICD-10-CM

## 2014-07-17 DIAGNOSIS — R918 Other nonspecific abnormal finding of lung field: Secondary | ICD-10-CM

## 2014-07-17 MED FILL — Lidocaine HCl Local Preservative Free (PF) Inj 1%: INTRAMUSCULAR | Qty: 30 | Status: AC

## 2014-07-17 MED FILL — Heparin Sodium (Porcine) 2 Unit/ML in Sodium Chloride 0.9%: INTRAMUSCULAR | Qty: 1000 | Status: AC

## 2014-07-17 NOTE — Telephone Encounter (Signed)
Mr Steve Baker has been evaluated by cardiology and they recommend that we can schedule his procedure. Needs ENB. I will place order for [facility] to schedule.

## 2014-07-20 ENCOUNTER — Telehealth: Payer: Self-pay | Admitting: Acute Care

## 2014-07-20 NOTE — Telephone Encounter (Signed)
I got a call from [name] about her husband Steve Baker's pending appointments. He has been cleared by cards for the EBUS, and was not sure if he needed the  appointment he had scheduled for 07/23/14. After checking with both Dr. Lamonte Sakai and [facility], we got [name] scheduled for the EBUS on 07/29/14 at 08:30 , and assured her it was all right for her to cancel the appointment on 07/23/14. I told her that the hospital would call her to give instructions for the bronch on 07/29/14. She verbalized understanding of all of the above.

## 2014-07-21 ENCOUNTER — Encounter: Payer: Self-pay | Admitting: Acute Care

## 2014-07-21 ENCOUNTER — Ambulatory Visit (HOSPITAL_COMMUNITY): Admission: RE | Admit: 2014-07-21 | Payer: Medicare Other | Source: Ambulatory Visit | Admitting: Emergency Medicine

## 2014-07-23 ENCOUNTER — Ambulatory Visit: Payer: Medicare Other | Admitting: Emergency Medicine

## 2014-07-27 ENCOUNTER — Ambulatory Visit (HOSPITAL_BASED_OUTPATIENT_CLINIC_OR_DEPARTMENT_OTHER): Payer: Medicare Other

## 2014-07-27 ENCOUNTER — Ambulatory Visit (HOSPITAL_COMMUNITY): Payer: Medicare Other | Attending: Cardiovascular Disease

## 2014-07-27 ENCOUNTER — Other Ambulatory Visit: Payer: Self-pay | Admitting: Cardiology

## 2014-07-27 DIAGNOSIS — I739 Peripheral vascular disease, unspecified: Secondary | ICD-10-CM

## 2014-07-27 DIAGNOSIS — J449 Chronic obstructive pulmonary disease, unspecified: Secondary | ICD-10-CM | POA: Diagnosis not present

## 2014-07-27 DIAGNOSIS — I70213 Atherosclerosis of native arteries of extremities with intermittent claudication, bilateral legs: Secondary | ICD-10-CM | POA: Diagnosis not present

## 2014-07-27 DIAGNOSIS — E785 Hyperlipidemia, unspecified: Secondary | ICD-10-CM | POA: Diagnosis not present

## 2014-07-27 DIAGNOSIS — I2510 Atherosclerotic heart disease of native coronary artery without angina pectoris: Secondary | ICD-10-CM | POA: Insufficient documentation

## 2014-07-27 DIAGNOSIS — F17200 Nicotine dependence, unspecified, uncomplicated: Secondary | ICD-10-CM | POA: Insufficient documentation

## 2014-07-27 DIAGNOSIS — I10 Essential (primary) hypertension: Secondary | ICD-10-CM | POA: Insufficient documentation

## 2014-07-27 DIAGNOSIS — Z95828 Presence of other vascular implants and grafts: Secondary | ICD-10-CM | POA: Insufficient documentation

## 2014-07-28 ENCOUNTER — Encounter: Payer: Self-pay | Admitting: *Deleted

## 2014-07-28 ENCOUNTER — Encounter: Payer: Self-pay | Admitting: Cardiovascular Disease

## 2014-07-28 ENCOUNTER — Ambulatory Visit (INDEPENDENT_AMBULATORY_CARE_PROVIDER_SITE_OTHER): Payer: Medicare Other | Admitting: Cardiovascular Disease

## 2014-07-28 ENCOUNTER — Encounter (HOSPITAL_COMMUNITY)
Admission: RE | Admit: 2014-07-28 | Discharge: 2014-07-28 | Disposition: A | Discharge status: Home / Self Care | Payer: Medicare Other | Source: Ambulatory Visit | Attending: Emergency Medicine | Admitting: Emergency Medicine

## 2014-07-28 ENCOUNTER — Encounter (HOSPITAL_COMMUNITY): Payer: Self-pay

## 2014-07-28 VITALS — BP 142/68 | HR 80 | Ht 68.0 in | Wt 188.0 lb

## 2014-07-28 DIAGNOSIS — I739 Peripheral vascular disease, unspecified: Secondary | ICD-10-CM | POA: Insufficient documentation

## 2014-07-28 DIAGNOSIS — R918 Other nonspecific abnormal finding of lung field: Secondary | ICD-10-CM | POA: Diagnosis not present

## 2014-07-28 DIAGNOSIS — Z7982 Long term (current) use of aspirin: Secondary | ICD-10-CM | POA: Diagnosis not present

## 2014-07-28 DIAGNOSIS — I25708 Atherosclerosis of coronary artery bypass graft(s), unspecified, with other forms of angina pectoris: Secondary | ICD-10-CM | POA: Diagnosis not present

## 2014-07-28 DIAGNOSIS — R599 Enlarged lymph nodes, unspecified: Secondary | ICD-10-CM | POA: Diagnosis not present

## 2014-07-28 DIAGNOSIS — I2510 Atherosclerotic heart disease of native coronary artery without angina pectoris: Secondary | ICD-10-CM | POA: Diagnosis not present

## 2014-07-28 DIAGNOSIS — F419 Anxiety disorder, unspecified: Secondary | ICD-10-CM | POA: Diagnosis not present

## 2014-07-28 DIAGNOSIS — Z8546 Personal history of malignant neoplasm of prostate: Secondary | ICD-10-CM | POA: Diagnosis not present

## 2014-07-28 DIAGNOSIS — I10 Essential (primary) hypertension: Secondary | ICD-10-CM | POA: Diagnosis not present

## 2014-07-28 DIAGNOSIS — I714 Abdominal aortic aneurysm, without rupture: Secondary | ICD-10-CM | POA: Diagnosis not present

## 2014-07-28 DIAGNOSIS — E785 Hyperlipidemia, unspecified: Secondary | ICD-10-CM | POA: Diagnosis not present

## 2014-07-28 DIAGNOSIS — F17210 Nicotine dependence, cigarettes, uncomplicated: Secondary | ICD-10-CM | POA: Diagnosis not present

## 2014-07-28 DIAGNOSIS — R911 Solitary pulmonary nodule: Secondary | ICD-10-CM | POA: Diagnosis not present

## 2014-07-28 DIAGNOSIS — Z79899 Other long term (current) drug therapy: Secondary | ICD-10-CM | POA: Diagnosis not present

## 2014-07-28 DIAGNOSIS — Z951 Presence of aortocoronary bypass graft: Secondary | ICD-10-CM | POA: Diagnosis not present

## 2014-07-28 DIAGNOSIS — I5022 Chronic systolic (congestive) heart failure: Secondary | ICD-10-CM | POA: Diagnosis not present

## 2014-07-28 HISTORY — DX: Anxiety disorder, unspecified: F41.9

## 2014-07-28 HISTORY — DX: Peripheral vascular disease, unspecified: I73.9

## 2014-07-28 LAB — CBC WITH DIFFERENTIAL/PLATELET
Basophils Absolute: 0 10*3/uL (ref 0.0–0.1)
Basophils Relative: 0.4 % (ref 0.0–3.0)
EOS PCT: 2.1 % (ref 0.0–5.0)
Eosinophils Absolute: 0.1 10*3/uL (ref 0.0–0.7)
HEMATOCRIT: 31.4 % — AB (ref 39.0–52.0)
HEMOGLOBIN: 9.3 g/dL — AB (ref 13.0–17.0)
LYMPHS PCT: 17.4 % (ref 12.0–46.0)
Lymphs Abs: 1 10*3/uL (ref 0.7–4.0)
MCV: 63.9 fl — ABNORMAL LOW (ref 78.0–100.0)
MONO ABS: 0.6 10*3/uL (ref 0.1–1.0)
MONOS PCT: 9.7 % (ref 3.0–12.0)
NEUTROS PCT: 70.4 % (ref 43.0–77.0)
Neutro Abs: 4 10*3/uL (ref 1.4–7.7)
Platelets: 337 10*3/uL (ref 150.0–400.0)
RBC: 4.92 Mil/uL (ref 4.22–5.81)
RDW: 21.6 % — ABNORMAL HIGH (ref 11.5–15.5)
WBC: 5.7 10*3/uL (ref 4.0–10.5)

## 2014-07-28 LAB — BASIC METABOLIC PANEL
BUN: 16 mg/dL (ref 6–23)
CO2: 26 meq/L (ref 19–32)
Calcium: 9 mg/dL (ref 8.4–10.5)
Chloride: 103 mEq/L (ref 96–112)
Creatinine, Ser: 0.76 mg/dL (ref 0.40–1.50)
GFR: 106.46 mL/min (ref >60.00)
Glucose, Bld: 88 mg/dL (ref 70–99)
Potassium: 4.3 mEq/L (ref 3.5–5.1)
Sodium: 134 mEq/L — ABNORMAL LOW (ref 135–145)

## 2014-07-28 LAB — PROTIME-INR
INR: 1 ratio (ref 0.8–1.0)
PROTHROMBIN TIME: 11.4 s (ref 9.6–13.1)

## 2014-07-28 NOTE — Assessment & Plan Note (Signed)
He appears to be euvolemic. Consider adding carvedilol and spironolactone.

## 2014-07-28 NOTE — Progress Notes (Signed)
   07/28/14 1406  OBSTRUCTIVE SLEEP APNEA  Have you ever been diagnosed with sleep apnea through a sleep study? No  Do you snore loudly (loud enough to be heard through closed doors)?  0  Do you often feel tired, fatigued, or sleepy during the daytime? 1  Has anyone observed you stop breathing during your sleep? 0  Do you have, or are you being treated for high blood pressure? 1  BMI more than 35 kg/m2? 0  Age over 74 years old? 1  Neck circumference greater than 40 cm/16 inches? 1  Gender: 1

## 2014-07-28 NOTE — Progress Notes (Signed)
Primary care physician: Dr. Arelia Sneddon Primary cardiologist: Dr. Radford Pax  HPI  This is a 74 year old male who was referred for evaluation of recently diagnosed peripheral arterial disease. He is an active smoker (84 pk-yrs) with history of CAD by cath 2001 with subsequent CABG, abdominal aneurysm repair with previous aortobifemoral bypass, hypertension, prostate CA (2001) and hyperlipidemia. He underwent a screening CT scan of the chest 06/04/14 ordered by Dr. Arelia Sneddon that showed a dominant right upper lobe mass and a smaller left lower lobe nodule. He was evaluated in the lung cancer screening program and a PET scan has been obtained 06/18/14 > both lesions are hypermetabolic, as was a slightly enlarged right peritracheal node. The patient is suspected of having lung cancer and he is undergoing bronchoscopy tomorrow. For pre-op evaluation, he underwent a nuclear stress test recently which showed an ejection fraction of 24% with evidence of prior myocardial infarction in the inferior lateral region. He underwent cardiac catheterization via the right femoral artery which showed severe native three-vessel coronary artery disease with 2 out of 4 patent bypass grafts. Medical therapy was recommended. The patient has been complaining of progressive left calf pain which is currently happening with minimal walking even just 20 feet. He also has right calf claudication but not as severe. He has no rest pain or lower extremity ulceration. He underwent noninvasive vascular evaluation yesterday which showed patent aortobifemoral bypass. ABI was severely reduced on the left side at 0.1. Was 0.6 on the right. There was evidence of long occlusion of the left common femoral artery, profunda and proximal to mid SFA with reconstitution via collaterals of the distal SFA with three-vessel runoff.  No Known Allergies   Current Outpatient Prescriptions on File Prior to Visit  Medication Sig Dispense Refill  . aspirin EC 81 MG  tablet Take 81 mg by mouth daily.    . furosemide (LASIX) 40 MG tablet Take 40 mg by mouth as needed for edema.   0  . lisinopril (PRINIVIL,ZESTRIL) 20 MG tablet Take 20 mg by mouth daily.    . pravastatin (PRAVACHOL) 40 MG tablet Take 40 mg by mouth daily.    . Tiotropium Bromide Monohydrate (SPIRIVA RESPIMAT) 2.5 MCG/ACT AERS Inhale 2 puffs into the lungs daily. 1 Inhaler 4   No current facility-administered medications on file prior to visit.     Past Medical History  Diagnosis Date  . Hypertension   . Hyperlipidemia   . Irregular heart beat     Pt stated his heart skips a beat every now and then  . Shortness of breath dyspnea     with ambulation  . Peripheral vascular disease   . Anxiety   . Cancer     prostate; seed insertion   2001     Past Surgical History  Procedure Laterality Date  . Cholecystectomy    . Heart bypass    . Cardiac catheterization  2001  . Skin cancer excision N/A     Nose  . Abdominal aortic aneurysm repair    . Eye surgery Bilateral     cataract removal  . Colonoscopy w/ polypectomy    . Cardiac catheterization N/A 07/16/2014    Procedure: Right/Left Heart Cath and Coronary Angiography;  Surgeon: Burnell Blanks, MD;  Location: [facility];  Service: Cardiovascular;  Laterality: N/A;  . Coronary artery bypass graft       Family History  Problem Relation Age of Onset  . Heart Problems Mother   . Heart  attack Father   . Stroke Neg Hx      History   Social History  . Marital Status: Married    Spouse Name: nancy  . Number of Children: 2  . Years of Education: college   Occupational History  . retired    Social History Main Topics  . Smoking status: Current Every Day Smoker -- 2.00 packs/day for 56 years    Types: Cigarettes  . Smokeless tobacco: Not on file  . Alcohol Use: 1.8 oz/week    3 Cans of beer, 0 Standard drinks or equivalent per week     Comment: daily  . Drug Use: No  . Sexual Activity: Not on file    Other Topics Concern  . Not on file   Social History Narrative     ROS A 10 point review of system was performed. It is negative other than that mentioned in the history of present illness.   PHYSICAL EXAM   BP 142/68 mmHg  Pulse 80  Ht '5\' 8"'$  (1.727 m)  Wt 188 lb (85.276 kg)  BMI 28.59 kg/m2 Constitutional: He is oriented to person, place, and time. He appears well-developed and well-nourished. No distress.  HENT: No nasal discharge.  Head: Normocephalic and atraumatic.  Eyes: Pupils are equal and round.  No discharge. Neck: Normal range of motion. Neck supple. No JVD present. No thyromegaly present.  Cardiovascular: Normal rate, regular rhythm, normal heart sounds. Exam reveals no gallop and no friction rub. No murmur heard.  Pulmonary/Chest: Effort normal and breath sounds normal. No stridor. No respiratory distress. He has no wheezes. He has no rales. He exhibits no tenderness.  Abdominal: Soft. Bowel sounds are normal. He exhibits no distension. There is no tenderness. There is no rebound and no guarding.  Musculoskeletal: Normal range of motion. He exhibits no edema and no tenderness.  Neurological: He is alert and oriented to person, place, and time. Coordination normal.  Skin: Skin is warm and dry. No rash noted. He is not diaphoretic. No erythema. No pallor.  Psychiatric: He has a normal mood and affect. His behavior is normal. Judgment and thought content normal.  Vascular: Femoral pulses are normal bilaterally. Distal pulses are not palpable. There is dependent rubor.      ASSESSMENT AND PLAN

## 2014-07-28 NOTE — Pre-Procedure Instructions (Signed)
Steve Baker  07/28/2014      [facility] - [place], [place] - [address] [place] [address] Phone: [phone] Fax: [phone]    Your procedure is scheduled on   Wednesday 07/29/14  Report to [facility] Admitting at  830 A.M.  Call this number if you have problems the morning of surgery:  [phone]   Remember:  Do not eat food or drink liquids after midnight.  Take these medicines the morning of surgery with A SIP OF WATER  SPIRIVA    Do not wear jewelry, make-up or nail polish.  Do not wear lotions, powders, or perfumes.  You may wear deodorant.  Do not shave 48 hours prior to surgery.  Men may shave face and neck.  Do not bring valuables to the hospital.  [facility] is not responsible for any belongings or valuables.  Contacts, dentures or bridgework may not be worn into surgery.  Leave your suitcase in the car.  After surgery it may be brought to your room.  For patients admitted to the hospital, discharge time will be determined by your treatment team.  Patients discharged the day of surgery will not be allowed to drive home.   Name and phone number of your driver:    Special instructions:  [place] - Preparing for Surgery  Before surgery, you can play an important role.  Because skin is not sterile, your skin needs to be as free of germs as possible.  You can reduce the number of germs on you skin by washing with CHG (chlorahexidine gluconate) soap before surgery.  CHG is an antiseptic cleaner which kills germs and bonds with the skin to continue killing germs even after washing.  Please DO NOT use if you have an allergy to CHG or antibacterial soaps.  If your skin becomes reddened/irritated stop using the CHG and inform your nurse when you arrive at Short Stay.  Do not shave (including legs and underarms) for at least 48 hours prior to the first CHG shower.  You may shave your face.  Please follow these  instructions carefully:   1.  Shower with CHG Soap the night before surgery and the                                morning of Surgery.  2.  If you choose to wash your hair, wash your hair first as usual with your       normal shampoo.  3.  After you shampoo, rinse your hair and body thoroughly to remove the                      Shampoo.  4.  Use CHG as you would any other liquid soap.  You can apply chg directly       to the skin and wash gently with scrungie or a clean washcloth.  5.  Apply the CHG Soap to your body ONLY FROM THE NECK DOWN.        Do not use on open wounds or open sores.  Avoid contact with your eyes,       ears, mouth and genitals (private parts).  Wash genitals (private parts)       with your normal soap.  6.  Wash thoroughly, paying special attention to the area where your surgery  will be performed.  7.  Thoroughly rinse your body with warm water from the neck down.  8.  DO NOT shower/wash with your normal soap after using and rinsing off       the CHG Soap.  9.  Pat yourself dry with a clean towel.            10.  Wear clean pajamas.            11.  Place clean sheets on your bed the night of your first shower and do not        sleep with pets.  Day of Surgery  Do not apply any lotions/deoderants the morning of surgery.  Please wear clean clothes to the hospital/surgery center.    Please read over the following fact sheets that you were given. Pain Booklet, Coughing and Deep Breathing and Surgical Site Infection Prevention

## 2014-07-28 NOTE — Patient Instructions (Signed)
Medication Instructions:  Your physician recommends that you continue on your current medications as directed. Please refer to the Current Medication list given to you today.   Labwork: Lab work to be done today--BMP, CBC, PT  Testing/Procedures: Your physician has requested that you have a peripheral vascular angiogram. This exam is performed at the hospital. During this exam IV contrast is used to look at arterial blood flow. Please review the information sheet given for details.  Scheduled for August 05, 2014    Follow-Up: To be determined after procedure.  We will contact you about rescheduling the appointment with Dr. Radford Pax (was scheduled for June 1)

## 2014-07-28 NOTE — Assessment & Plan Note (Signed)
Blood pressure is controlled on current medications. 

## 2014-07-28 NOTE — Assessment & Plan Note (Signed)
The patient has evidence of severe peripheral arterial disease especially on the left side. Although there is no evidence of critical limb ischemia, he is at significant risk for rapid progression given that anatomic location of the occlusion involving common femoral artery, proximal profunda and SFA. His ABI was only 0.1. Unfortunately, he continues to smoke although he is trying to quit. Also he has multiple comorbidities including recent diagnosis of lung cancer as well as severe systolic heart failure and coronary artery disease. I recommend proceeding with abdominal aortogram with lower extremity runoff and possible endovascular intervention. Most likely, he will require surgical revascularization on the left side. Planned access is via the right common femoral artery

## 2014-07-28 NOTE — Progress Notes (Signed)
Anesthesia Chart Review:  Pt is 74 year old male scheduled for video bronchoscopy with endobronchial navigation on 07/29/2014 with Dr. Lamonte Sakai.   PCP Claris Gower.   PMH includes: CAD (s/p CABG), HTN, hyperlipidemia, prostate cancer. Current smoker. BMI 29. S/p abdominal aneurysm repair.   Preoperative labs reviewed.  H/H 9.3/31.4. Stable since 07/03/14. No prior labs in system. T&S ordered for DOS.   EKG 07/10/2014: NSR. Possible anterior infarct, age undetermined.   Cardiac cath 07/16/2014 done for abnormal stress test:   Prox RCA lesion, 100% stenosed.  Dist Cx lesion, 100% stenosed.  Prox Cx lesion, 50% stenosed.  Prox Cx to Mid Cx lesion, 95% stenosed.  1st Diag lesion, 80% stenosed.  Ost 2nd Diag lesion, 50% stenosed.  Mid LAD to Dist LAD lesion, 100% stenosed.  Origin lesion, 100% stenosed.  Prox Graft lesion, 100% stenosed.  Origin lesion, 100% stenosed.  1. Severe triple vessel CAD s/p 4V CABG with 2/4 patent bypass grafts 2. Occlusion of mid LAD. The mid and distal LAD fills from the patent IMA graft and also supplies a moderate caliber diagonal branch and gives off left to right collaterals to the PDA.  3. Occluded RCA with distal filling from the patent graft and the left to right collaterals.  4. Severe stenosis in the mid Circumflex artery leading into the small caliber distal OM branches. The graft to this vessel is occluded 5. Severe LV systolic dysfunction  Recommendations: I would continue medical management of his CAD. The stenosis in his small caliber distal Circumflex and OM system is severe but he is having no angina and his LV dysfunction is out of proportion to this small potentially ischemic territory. I would proceed with planning for his lung tumor diagnosis. I would not recommend stenting of the small distal Circumflex at this time.  Echo 07/06/2014: - Left ventricle: The cavity size was normal. Wall thickness was increased in a pattern of mild LVH.  Systolic function was mildly to moderately reduced. The estimated ejection fraction was in the range of 40% to 45%. There is akinesis of the inferolateral myocardium. There is akinesis of the basalinferior myocardium. Doppler parameters are consistent with restrictive physiology, indicative of decreased left ventricular diastolic compliance and/or increased left atrial pressure. Doppler parameters are consistent with high ventricular filling pressure. - Aortic valve: There was trivial regurgitation. - Mitral valve: There was mild regurgitation. - Left atrium: The atrium was moderately dilated. - Right atrium: The atrium was moderately dilated. - Pulmonary arteries: Systolic pressure was moderately increased. PA peak pressure: 54 mm Hg (S). Impressions:- Mild to moderate reduction in LV function; akinesis of the basal/mild inferior and inferior lateral wall; restrictive LV filling; biatrial enlargement; mild MR; trace AI; moderately increased pulmonary pressure.  If no changes, I anticipate pt can proceed with surgery as scheduled.   Willeen Cass, FNP-BC [facility]Anesthesiology Phone: [phone] 07/28/2014 3:51 PM

## 2014-07-28 NOTE — Assessment & Plan Note (Signed)
Cardiac cath results were reviewed. Agree with medical management.

## 2014-07-29 ENCOUNTER — Ambulatory Visit (HOSPITAL_COMMUNITY): Payer: Medicare Other

## 2014-07-29 ENCOUNTER — Ambulatory Visit (HOSPITAL_COMMUNITY)
Admission: RE | Admit: 2014-07-29 | Discharge: 2014-07-29 | Disposition: A | Discharge status: Home / Self Care | Payer: Medicare Other | Source: Ambulatory Visit | Attending: Emergency Medicine | Admitting: Emergency Medicine

## 2014-07-29 ENCOUNTER — Ambulatory Visit (HOSPITAL_COMMUNITY): Payer: Medicare Other | Admitting: Certified Registered Nurse Anesthetist

## 2014-07-29 ENCOUNTER — Ambulatory Visit (HOSPITAL_COMMUNITY): Payer: Medicare Other | Admitting: Vascular Surgery

## 2014-07-29 ENCOUNTER — Encounter (HOSPITAL_COMMUNITY)
Admission: RE | Disposition: A | Discharge status: Home / Self Care | Payer: Self-pay | Source: Ambulatory Visit | Attending: Emergency Medicine

## 2014-07-29 ENCOUNTER — Encounter (HOSPITAL_COMMUNITY): Payer: Self-pay | Admitting: Certified Registered Nurse Anesthetist

## 2014-07-29 DIAGNOSIS — Z9889 Other specified postprocedural states: Secondary | ICD-10-CM

## 2014-07-29 DIAGNOSIS — R911 Solitary pulmonary nodule: Secondary | ICD-10-CM | POA: Diagnosis not present

## 2014-07-29 DIAGNOSIS — I739 Peripheral vascular disease, unspecified: Secondary | ICD-10-CM | POA: Insufficient documentation

## 2014-07-29 DIAGNOSIS — Z8546 Personal history of malignant neoplasm of prostate: Secondary | ICD-10-CM | POA: Insufficient documentation

## 2014-07-29 DIAGNOSIS — R918 Other nonspecific abnormal finding of lung field: Secondary | ICD-10-CM | POA: Diagnosis not present

## 2014-07-29 DIAGNOSIS — I10 Essential (primary) hypertension: Secondary | ICD-10-CM | POA: Insufficient documentation

## 2014-07-29 DIAGNOSIS — E785 Hyperlipidemia, unspecified: Secondary | ICD-10-CM | POA: Insufficient documentation

## 2014-07-29 DIAGNOSIS — Z951 Presence of aortocoronary bypass graft: Secondary | ICD-10-CM | POA: Insufficient documentation

## 2014-07-29 DIAGNOSIS — Z79899 Other long term (current) drug therapy: Secondary | ICD-10-CM | POA: Insufficient documentation

## 2014-07-29 DIAGNOSIS — C3491 Malignant neoplasm of unspecified part of right bronchus or lung: Secondary | ICD-10-CM | POA: Diagnosis present

## 2014-07-29 DIAGNOSIS — F419 Anxiety disorder, unspecified: Secondary | ICD-10-CM | POA: Insufficient documentation

## 2014-07-29 DIAGNOSIS — Z7982 Long term (current) use of aspirin: Secondary | ICD-10-CM | POA: Insufficient documentation

## 2014-07-29 DIAGNOSIS — Z419 Encounter for procedure for purposes other than remedying health state, unspecified: Secondary | ICD-10-CM

## 2014-07-29 DIAGNOSIS — I714 Abdominal aortic aneurysm, without rupture: Secondary | ICD-10-CM | POA: Insufficient documentation

## 2014-07-29 DIAGNOSIS — R599 Enlarged lymph nodes, unspecified: Secondary | ICD-10-CM | POA: Insufficient documentation

## 2014-07-29 DIAGNOSIS — I2510 Atherosclerotic heart disease of native coronary artery without angina pectoris: Secondary | ICD-10-CM | POA: Insufficient documentation

## 2014-07-29 DIAGNOSIS — F17210 Nicotine dependence, cigarettes, uncomplicated: Secondary | ICD-10-CM | POA: Insufficient documentation

## 2014-07-29 HISTORY — PX: VIDEO BRONCHOSCOPY WITH ENDOBRONCHIAL NAVIGATION: SHX6175

## 2014-07-29 LAB — TYPE AND SCREEN
ABO/RH(D): O POS
Antibody Screen: NEGATIVE

## 2014-07-29 LAB — ABO/RH: ABO/RH(D): O POS

## 2014-07-29 SURGERY — VIDEO BRONCHOSCOPY WITH ENDOBRONCHIAL NAVIGATION
Anesthesia: General

## 2014-07-29 MED ORDER — PROPOFOL 10 MG/ML IV BOLUS
INTRAVENOUS | Status: DC | PRN
  Administered 2014-07-29: 180 mg via INTRAVENOUS

## 2014-07-29 MED ORDER — ARTIFICIAL TEARS OP OINT
TOPICAL_OINTMENT | OPHTHALMIC | Status: AC
  Filled 2014-07-29: qty 3.5

## 2014-07-29 MED ORDER — PHENYLEPHRINE HCL 10 MG/ML IJ SOLN
INTRAMUSCULAR | Status: DC | PRN
  Administered 2014-07-29: 40 ug via INTRAVENOUS

## 2014-07-29 MED ORDER — EPINEPHRINE HCL 1 MG/ML IJ SOLN
INTRAMUSCULAR | Status: AC
  Filled 2014-07-29: qty 1

## 2014-07-29 MED ORDER — PHENYLEPHRINE HCL 10 MG/ML IJ SOLN
10.0000 mg | INTRAVENOUS | Status: DC | PRN
  Administered 2014-07-29: 20 ug/min via INTRAVENOUS

## 2014-07-29 MED ORDER — ONDANSETRON HCL 4 MG/2ML IJ SOLN
INTRAMUSCULAR | Status: DC | PRN
  Administered 2014-07-29: 4 mg via INTRAVENOUS

## 2014-07-29 MED ORDER — LIDOCAINE HCL (CARDIAC) 20 MG/ML IV SOLN
INTRAVENOUS | Status: DC | PRN
  Administered 2014-07-29: 80 mg via INTRAVENOUS

## 2014-07-29 MED ORDER — PROMETHAZINE HCL 25 MG/ML IJ SOLN: 6.2500 mg | INTRAMUSCULAR | Status: DC | PRN

## 2014-07-29 MED ORDER — SUCCINYLCHOLINE CHLORIDE 20 MG/ML IJ SOLN
INTRAMUSCULAR | Status: DC | PRN
  Administered 2014-07-29: 120 mg via INTRAVENOUS

## 2014-07-29 MED ORDER — NEOSTIGMINE METHYLSULFATE 10 MG/10ML IV SOLN
INTRAVENOUS | Status: DC | PRN
Start: 2014-07-29 — End: 2014-07-29
  Administered 2014-07-29: 3 mg via INTRAVENOUS

## 2014-07-29 MED ORDER — PROPOFOL 10 MG/ML IV BOLUS
INTRAVENOUS | Status: AC
  Filled 2014-07-29: qty 20

## 2014-07-29 MED ORDER — GLYCOPYRROLATE 0.2 MG/ML IJ SOLN
INTRAMUSCULAR | Status: DC | PRN
  Administered 2014-07-29: 0.4 mg via INTRAVENOUS

## 2014-07-29 MED ORDER — ONDANSETRON HCL 4 MG/2ML IJ SOLN
INTRAMUSCULAR | Status: AC
  Filled 2014-07-29: qty 2

## 2014-07-29 MED ORDER — ROCURONIUM BROMIDE 50 MG/5ML IV SOLN
INTRAVENOUS | Status: AC
  Filled 2014-07-29: qty 1

## 2014-07-29 MED ORDER — ROCURONIUM BROMIDE 100 MG/10ML IV SOLN
INTRAVENOUS | Status: DC | PRN
  Administered 2014-07-29: 30 mg via INTRAVENOUS

## 2014-07-29 MED ORDER — HYDROMORPHONE HCL 1 MG/ML IJ SOLN: 0.2500 mg | INTRAMUSCULAR | Status: DC | PRN

## 2014-07-29 MED ORDER — FENTANYL CITRATE (PF) 100 MCG/2ML IJ SOLN
INTRAMUSCULAR | Status: DC | PRN
  Administered 2014-07-29: 150 ug via INTRAVENOUS

## 2014-07-29 MED ORDER — 0.9 % SODIUM CHLORIDE (POUR BTL) OPTIME
TOPICAL | Status: DC | PRN
  Administered 2014-07-29: 1000 mL

## 2014-07-29 MED ORDER — ALBUTEROL SULFATE HFA 108 (90 BASE) MCG/ACT IN AERS
INHALATION_SPRAY | RESPIRATORY_TRACT | Status: DC | PRN
  Administered 2014-07-29: 4 via RESPIRATORY_TRACT

## 2014-07-29 MED ORDER — EPHEDRINE SULFATE 50 MG/ML IJ SOLN
INTRAMUSCULAR | Status: DC | PRN
Start: 2014-07-29 — End: 2014-07-29
  Administered 2014-07-29: 10 mg via INTRAVENOUS

## 2014-07-29 MED ORDER — FENTANYL CITRATE (PF) 250 MCG/5ML IJ SOLN
INTRAMUSCULAR | Status: AC
  Filled 2014-07-29: qty 5

## 2014-07-29 MED ORDER — LACTATED RINGERS IV SOLN
INTRAVENOUS | Status: DC
  Administered 2014-07-29: 13:00:00 via INTRAVENOUS
  Administered 2014-07-29: 10 mL/h via INTRAVENOUS

## 2014-07-29 SURGICAL SUPPLY — 41 items
BRUSH CYTOL CELLEBRITY 1.5X140 (MISCELLANEOUS) ×3 IMPLANT
BRUSH SUPERTRAX BIOPSY (INSTRUMENTS) IMPLANT
BRUSH SUPERTRAX NDL-TIP CYTO (INSTRUMENTS) ×6 IMPLANT
CANISTER SUCTION 2500CC (MISCELLANEOUS) ×3 IMPLANT
CHANNEL WORK EXTEND EDGE 180 (KITS) IMPLANT
CHANNEL WORK EXTEND EDGE 45 (KITS) IMPLANT
CHANNEL WORK EXTEND EDGE 90 (KITS) IMPLANT
CONT SPEC 4OZ CLIKSEAL STRL BL (MISCELLANEOUS) ×6 IMPLANT
COVER TABLE BACK 60X90 (DRAPES) ×3 IMPLANT
Covidien Superlock Fiducial Marker  Nitinol Coil 4 ×3 IMPLANT
Covidien Superlock Fiducial Marker Nitinol Coil 4m ×9 IMPLANT
FILTER STRAW FLUID ASPIR (MISCELLANEOUS) IMPLANT
FORCEPS BIOP SUPERTRX PREMAR (INSTRUMENTS) ×3 IMPLANT
FORCEPS RADIAL JAW LRG 4 PULM (INSTRUMENTS) ×1 IMPLANT
GAUZE SPONGE 4X4 12PLY STRL (GAUZE/BANDAGES/DRESSINGS) ×3 IMPLANT
GLOVE BIO SURGEON STRL SZ7.5 (GLOVE) ×6 IMPLANT
GLOVE BIOGEL PI IND STRL 6.5 (GLOVE) ×1 IMPLANT
GLOVE BIOGEL PI INDICATOR 6.5 (GLOVE) ×2
KIT CLEAN ENDO COMPLIANCE (KITS) ×3 IMPLANT
KIT LOCATABLE GUIDE (CANNULA) IMPLANT
KIT MARKER FIDUCIAL DELIVERY (KITS) ×3 IMPLANT
KIT PROCEDURE EDGE 180 (KITS) ×3 IMPLANT
KIT PROCEDURE EDGE 45 (KITS) IMPLANT
KIT PROCEDURE EDGE 90 (KITS) IMPLANT
KIT ROOM TURNOVER OR (KITS) ×3 IMPLANT
MARKER FIDUCIAL SL NIT COIL (Implant Marker) ×12 IMPLANT
MARKER SKIN DUAL TIP RULER LAB (MISCELLANEOUS) ×3 IMPLANT
NEEDLE SUPERTRX PREMARK BIOPSY (NEEDLE) ×3 IMPLANT
NS IRRIG 1000ML POUR BTL (IV SOLUTION) ×3 IMPLANT
OIL SILICONE PENTAX (PARTS (SERVICE/REPAIRS)) ×3 IMPLANT
PAD ARMBOARD 7.5X6 YLW CONV (MISCELLANEOUS) ×6 IMPLANT
PATCHES PATIENT (LABEL) ×3 IMPLANT
RADIAL JAW LRG 4 PULMONARY (INSTRUMENTS) ×2
SYR 20CC LL (SYRINGE) ×3 IMPLANT
SYR 20ML ECCENTRIC (SYRINGE) ×3 IMPLANT
SYR 50ML SLIP (SYRINGE) ×3 IMPLANT
SYSTEM GENCUT CORE BIOPSY (NEEDLE) ×3 IMPLANT
TOWEL OR 17X24 6PK STRL BLUE (TOWEL DISPOSABLE) ×3 IMPLANT
TRAP SPECIMEN MUCOUS 40CC (MISCELLANEOUS) IMPLANT
TUBE CONNECTING 20'X1/4 (TUBING) ×1
TUBE CONNECTING 20X1/4 (TUBING) ×2 IMPLANT

## 2014-07-29 NOTE — Op Note (Signed)
Video Bronchoscopy with Electromagnetic Navigation Procedure Note  Date of Operation: 07/29/2014  Pre-op Diagnosis: right upper lobe mass, left lower lobe nodule  Post-op Diagnosis: adenocarcinoma  Surgeon: Baltazar Apo  Assistants: none  Anesthesia: General endotracheal anesthesia  Operation: Flexible video fiberoptic bronchoscopy with electromagnetic navigation and biopsies.  Estimated Blood Loss: Minimal  Complications: none apparent  Indications and History: Steve Baker is a 74 y.o. male with history of tobacco abuse. He was found to have a force and a meter right upper lobe mass and a 1.2 cm left lower lobe nodule on a screening CT scan of the chest. Recommendation was made to pursue tissue diagnosis via navigational bronchoscopy..  The risks, benefits, complications, treatment options and expected outcomes were discussed with the patient.  The possibilities of pneumothorax, pneumonia, reaction to medication, pulmonary aspiration, perforation of a viscus, bleeding, failure to diagnose a condition and creating a complication requiring transfusion or operation were discussed with the patient who freely signed the consent.    Description of Procedure: The patient was seen in the Preoperative Area, was examined and was deemed appropriate to proceed.  The patient was taken to OR 10, identified as Steve Baker and the procedure verified as Flexible Video Fiberoptic Bronchoscopy.  A Time Out was held and the above information confirmed.   Prior to the date of the procedure a high-resolution CT scan of the chest was performed. Utilizing [place] a virtual tracheobronchial tree was generated to allow the creation of distinct navigation pathways to the patient's parenchymal abnormalities. After being taken to the operating room general anesthesia was initiated and the patient  was orally intubated. The video fiberoptic bronchoscope was introduced via the endotracheal tube and a general  inspection was performed which showed normal airways with the exception of a possible raised lesion in the left upper lobe bronchus. It was unclear whether this was truly an endobronchial lesion versus just ectatic airway mucosa. Endobronchial biopsies were performed in the left upper lobe bronchus at this lesion. The extendable working channel and locator guide then were introduced into the bronchoscope. The distinct navigation pathways prepared prior to this procedure were then utilized to navigate to within 1 cm of patient's lesions identified on CT scan. The extendable working channel was secured into place and the locator guide was withdrawn. Under fluoroscopic guidance transbronchial needle brushings, transbronchial Wang needle biopsies, and transbronchial forceps biopsies were performed on the right upper lobe mass to be sent for cytology and pathology. A single gold fiducial marker was placed in the center of the right upper lobe mass for localization purposes. Attention was then turned to the left lower lobe nodule. Under fluoroscopic guidance transbronchial needle brushings and transbronchial forceps biopsies were performed at this location. Three individual goal fiducial markers were then placed in airways triangulating the left lower lobe nodule to facilitate possible SBRT if indicated.  At the end of the procedure a general airway inspection was performed and there was no evidence of active bleeding. The bronchoscope was removed.  The patient tolerated the procedure well. There was no significant blood loss and there were no obvious complications. A post-procedural chest x-ray is pending.  Samples: 1. Transbronchial needle brushings from right upper lobe mass 2. Transbronchial Wang needle biopsies from right upper lobe mass 3. Transbronchial forceps biopsies from right upper lobe mass 4. Transbronchial needle brushings from left lower lobe nodule 5. Transbronchial forceps biopsies from left lower  lobe nodule 6. Endobronchial biopsies from left upper lobe bronchus  Plans:  The patient will be discharged from the PACU to home when recovered from anesthesia and after chest x-ray is reviewed. We will review the cytology, pathology results with the patient when they become available. 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 Outpatient followup will be with Dr Lamonte Sakai.    Baltazar Apo, MD, PhD 07/29/2014, 12:42 PM [facility] [phone] or if no answer [phone]

## 2014-07-29 NOTE — Anesthesia Procedure Notes (Signed)
Procedure Name: Intubation Date/Time: 07/29/2014 11:02 AM Performed by: [place] Pink Pre-anesthesia Checklist: Patient identified, Emergency Drugs available, Suction available, Patient being monitored and Timeout performed Patient Re-evaluated:Patient Re-evaluated prior to inductionOxygen Delivery Method: Circle system utilized Preoxygenation: Pre-oxygenation with 100% oxygen Intubation Type: IV induction Ventilation: Oral airway inserted - appropriate to patient size and Mask ventilation without difficulty Laryngoscope Size: Mac and 4 Grade View: Grade III Tube type: Oral Tube size: 8.5 mm Number of attempts: 1 Airway Equipment and Method: Stylet and LTA kit utilized Placement Confirmation: ETT inserted through vocal cords under direct vision,  positive ETCO2 and breath sounds checked- equal and bilateral Secured at: 22 cm Tube secured with: Tape Dental Injury: Teeth and Oropharynx as per pre-operative assessment

## 2014-07-29 NOTE — H&P (Signed)
Patient ID: Steve Baker, male DOB: February 02, 1941, 74 y.o. MRN: [id number]  HPI 74 year old active smoker (55 pk-yrs) with history of CAD/CABG, abdominal aneurysm repair, hypertension, prostate CA (2001), hyperlipidemia. He underwent a screening CT scan of the chest 06/04/14 ordered by Dr. Arelia Sneddon that showed a dominant right upper lobe mass and a smaller left lower lobe nodule. He was evaluated in the lung cancer screening program and a PET scan has been obtained 06/18/14 > both lesions are hypermetabolic, as was a slightly enlarged right peritracheal node. There was evidence of pharyngeal esophageal activity without a corresponding anatomical abnormality on CT scan.    Hospital H&P 07/29/14 --  Steve Baker presents today for continued eval of his RUL and LLL nodules. He has undergone cardiology screening by Dr Fletcher Anon, L cath showed LV dysfxn and 2 of 4 patent bypass grafts. He is on medical management and it was recommended that we proceed with his bronchoscopy. He denies any new sx - he does still have cough and sputum, continues to smoke. Denies any CP or dyspnea, but admits that he is inactive and has not tested his exertional tolerance.    Past Medical History  Diagnosis Date  . Hypertension   . Hyperlipidemia   . Irregular heart beat     Pt stated his heart skips a beat every now and then  . Shortness of breath dyspnea     with ambulation  . Peripheral vascular disease   . Anxiety   . Cancer     prostate; seed insertion   2001     Family History  Problem Relation Age of Onset  . Heart Problems Mother   . Heart attack Father   . Stroke Neg Hx      History   Social History  . Marital Status: Married    Spouse Name: Steve Baker  . Number of Children: 2  . Years of Education: college   Occupational History  . retired    Social History Main Topics  . Smoking status: Current Every Day Smoker -- 2.00 packs/day for 56 years    Types: Cigarettes  . Smokeless tobacco: Not on file  .  Alcohol Use: 1.8 oz/week    3 Cans of beer, 0 Standard drinks or equivalent per week     Comment: daily  . Drug Use: No  . Sexual Activity: Not on file   Other Topics Concern  . Not on file   Social History Narrative     No Known Allergies     No current facility-administered medications on file prior to encounter.   Current Outpatient Prescriptions on File Prior to Encounter  Medication Sig Dispense Refill  . aspirin EC 81 MG tablet Take 81 mg by mouth daily.    . furosemide (LASIX) 40 MG tablet Take 40 mg by mouth as needed for edema.   0  . lisinopril (PRINIVIL,ZESTRIL) 20 MG tablet Take 20 mg by mouth daily.    . pravastatin (PRAVACHOL) 40 MG tablet Take 40 mg by mouth daily.    . Tiotropium Bromide Monohydrate (SPIRIVA RESPIMAT) 2.5 MCG/ACT AERS Inhale 2 puffs into the lungs daily. 1 Inhaler 4   Filed Vitals:   07/29/14 0858  BP: 127/43  Pulse: 77  Temp: 97.6 F (36.4 C)  TempSrc: Oral  Resp: 20  Weight: 85.73 kg (189 lb)  SpO2: 100%  Gen: Pleasant, well-nourished, in no distress,  normal affect  ENT: No lesions, oropharynx clear, no postnasal drip, M3 airway  Neck: No  JVD, no TMG, no carotid bruits  Lungs: No use of accessory muscles, clear without rales or rhonchi  Cardiovascular: RRR, heart sounds normal, no murmur or gallops, no peripheral edema  Musculoskeletal: No deformities, no cyanosis or clubbing  Neuro: alert, non focal  Skin: Warm, no lesions or rashes   CT chest 06/29/14 --  COMPARISON: Chest CT 06/04/2014. PET-CT 06/18/2014.  FINDINGS: Mediastinum/Lymph Nodes: Heart size is borderline enlarged. There is no significant pericardial fluid, thickening or pericardial calcification. There is atherosclerosis of the thoracic aorta, the great vessels of the mediastinum and the coronary arteries, including calcified atherosclerotic plaque in the left main, left anterior descending, left circumflex and right coronary arteries. Status post median  sternotomy for CABG, including LIMA to the LAD. Low right paratracheal lymph node measuring 14 mm in short axis is unchanged. Esophagus is unremarkable in appearance. No axillary lymphadenopathy.  Lungs/Pleura: 4.0 x 4.7 x 4.3 cm mass in the apex of the right upper lobe has macrolobulated and spiculated margins, and abuts the pleura superiorly and anteriorly. There is some surrounding architectural distortion adjacent to this lesion, where there is some septal thickening and mild ground-glass attenuation. In addition, there is a macrolobulated nodule in the medial aspect of the left lower lobe (image 42 of series 3) measuring 2.0 x 1.4 cm. No acute consolidative airspace disease. Trace amount of dependent left-sided pleural fluid and/or thickening (image 42 of series 2), new compared to prior studies. Mild diffuse bronchial wall thickening with mild centrilobular and paraseptal emphysema.  Upper Abdomen: Status post cholecystectomy. Extensive atherosclerosis.  Musculoskeletal/Soft Tissues: Median sternotomy wires. There are no aggressive appearing lytic or blastic lesions noted in the visualized portions of the skeleton.  IMPRESSION: 1. 4.0 x 4.7 x 4.3 cm right apical mass with low right paratracheal lymphadenopathy, and aggressive appearing contralateral lung nodule in the left lower lobe measuring 2.0 x 1.4 cm, redemonstrated, as above. 2. Trace amount of left-sided pleural fluid and/or thickening dependently, new compared to the prior examinations. 3. Mild diffuse bronchial wall thickening with mild centrilobular and paraseptal emphysema; imaging findings suggestive of underlying COPD. 4. Atherosclerosis, including left main and 3 vessel coronary artery disease. Status post median sternotomy for CABG, including LIMA to the LAD.   Plans:  Will proceed with ENB and bx's of both RUL and LL nodules if accessible. Procedure discussed in full with pt, all questions answered.  He elects to proceed.   Baltazar Apo, MD, PhD 07/29/2014, 10:28 AM [facility] [phone] or if no answer [phone]

## 2014-07-29 NOTE — Anesthesia Preprocedure Evaluation (Signed)
Anesthesia Evaluation  Patient identified by MRN, date of birth, ID band Patient awake    Reviewed: Allergy & Precautions, NPO status , Patient's Chart, lab work & pertinent test results  Airway Mallampati: II   Neck ROM: Full    Dental  (+) Teeth Intact, Poor Dentition, Missing, Loose,    Pulmonary shortness of breath and with exertion, COPDCurrent Smoker,  breath sounds clear to auscultation        Cardiovascular hypertension, + CAD and + Peripheral Vascular Disease Rhythm:Regular Rate:Normal  Recent ECHO and cardiac cath are noted. Severe CAD c 2/4 grafts patent only.   Neuro/Psych    GI/Hepatic   Endo/Other    Renal/GU      Musculoskeletal   Abdominal   Peds  Hematology  (+) Blood dyscrasia, anemia ,   Anesthesia Other Findings   Reproductive/Obstetrics                             Anesthesia Physical Anesthesia Plan  ASA: IV  Anesthesia Plan: General   Post-op Pain Management:    Induction: Intravenous  Airway Management Planned: Oral ETT  Additional Equipment:   Intra-op Plan:   Post-operative Plan: Extubation in OR  Informed Consent: I have reviewed the patients History and Physical, chart, labs and discussed the procedure including the risks, benefits and alternatives for the proposed anesthesia with the patient or authorized representative who has indicated his/her understanding and acceptance.   Dental advisory given  Plan Discussed with: Surgeon  Anesthesia Plan Comments: (Increased risk for gen anesth and surg due to CV considerations)        Anesthesia Quick Evaluation

## 2014-07-29 NOTE — Discharge Instructions (Signed)
Flexible Bronchoscopy, Care After °These instructions give you information on caring for yourself after your procedure. Your doctor may also give you more specific instructions. Call your doctor if you have any problems or questions after your procedure. °HOME CARE °· Do not eat or drink anything for 2 hours after your procedure. If you try to eat or drink before the medicine wears off, food or drink could go into your lungs. You could also burn yourself. °· After 2 hours have passed and when you can cough and gag normally, you may eat soft food and drink liquids slowly. °· The day after the test, you may eat your normal diet. °· You may do your normal activities. °· Keep all doctor visits. °GET HELP RIGHT AWAY IF: °· You get more and more short of breath. °· You get light-headed. °· You feel like you are going to pass out (faint). °· You have chest pain. °· You have new problems that worry you. °· You cough up more than a little blood. °· You cough up more blood than before. °MAKE SURE YOU: °· Understand these instructions. °· Will watch your condition. °· Will get help right away if you are not doing well or get worse. °Document Released: 12/18/2008 Document Revised: 02/25/2013 Document Reviewed: 10/25/2012 °[employer]® Patient Information ©2015 [employer]. This information is not intended to replace advice given to you by your health care provider. Make sure you discuss any questions you have with your health care provider. ° °Please call our office for any questions or concerns. [phone].  ° ° ° °

## 2014-07-29 NOTE — Anesthesia Postprocedure Evaluation (Signed)
  Anesthesia Post-op Note  Patient: Steve Baker  Procedure(s) Performed: Procedure(s): VIDEO BRONCHOSCOPY WITH ENDOBRONCHIAL NAVIGATION (N/A)  Patient Location: PACU  Anesthesia Type:General  Level of Consciousness: awake and alert   Airway and Oxygen Therapy: Patient Spontanous Breathing  Post-op Pain: none  Post-op Assessment: Post-op Vital signs reviewed, Patient's Cardiovascular Status Stable and Respiratory Function Stable  Post-op Vital Signs: stable  Last Vitals:  Filed Vitals:   07/29/14 1421  BP: 94/57  Pulse: 74  Temp:   Resp: 10    Complications: No apparent anesthesia complications

## 2014-07-29 NOTE — Transfer of Care (Signed)
Immediate Anesthesia Transfer of Care Note  Patient: Steve Baker  Procedure(s) Performed: Procedure(s): VIDEO BRONCHOSCOPY WITH ENDOBRONCHIAL NAVIGATION (N/A)  Patient Location: PACU  Anesthesia Type:General  Level of Consciousness: awake and alert   Airway & Oxygen Therapy: Patient Spontanous Breathing and Patient connected to nasal cannula oxygen  Post-op Assessment: Report given to RN and Post -op Vital signs reviewed and stable  Post vital signs: Reviewed and stable  Last Vitals:  Filed Vitals:   07/29/14 0858  BP: 127/43  Pulse: 77  Temp: 36.4 C  Resp: 20    Complications: No apparent anesthesia complications

## 2014-07-31 ENCOUNTER — Ambulatory Visit: Payer: Medicare Other | Admitting: Cardiovascular Disease

## 2014-08-04 ENCOUNTER — Telehealth: Payer: Self-pay | Admitting: *Deleted

## 2014-08-04 ENCOUNTER — Telehealth: Payer: Self-pay | Admitting: Emergency Medicine

## 2014-08-04 ENCOUNTER — Encounter (HOSPITAL_COMMUNITY): Payer: Self-pay | Admitting: Emergency Medicine

## 2014-08-04 DIAGNOSIS — C3490 Malignant neoplasm of unspecified part of unspecified bronchus or lung: Secondary | ICD-10-CM

## 2014-08-04 NOTE — Telephone Encounter (Signed)
Called and spoke to pt's wife, Izora Gala. Izora Gala is requesting the results of pt's bronch. Izora Gala is aware once the results are available she will be contacted.   RB please advise.

## 2014-08-04 NOTE — Telephone Encounter (Signed)
Oncology Nurse Navigator Documentation  Oncology Nurse Navigator Flowsheets 08/04/2014  Referral date to RadOnc/MedOnc 08/04/2014  Navigator Encounter Type Introductory phone call.  Called patient to set up for [place].  I spoke with wife and she is aware of appt time and place on 08/13/14 arrive at 2:45.    Time Spent with Patient 15

## 2014-08-04 NOTE — Telephone Encounter (Signed)
I called and spoke with the patient's wife Steve Baker. Explained the biopsy results which show adenocarcinoma from the right upper lobe mass. The left lower lobe nodule shows only atypical cells on the brushings the other samples are unrevealing. It is possible that these are 2 synchronous primaries. I believe he needs to be seen in the multidisciplinary thoracic oncology clinic to decide next steps. I did place the usual markers in anticipation that he may need stereotactic radiation. I doubt that he will be a surgical candidate based on his chronic medical problems

## 2014-08-05 ENCOUNTER — Ambulatory Visit: Payer: Medicare Other | Admitting: Cardiology

## 2014-08-05 ENCOUNTER — Encounter (HOSPITAL_COMMUNITY)
Admission: RE | Disposition: A | Discharge status: Home / Self Care | Payer: Medicare Other | Source: Ambulatory Visit | Attending: Cardiovascular Disease

## 2014-08-05 ENCOUNTER — Ambulatory Visit (HOSPITAL_COMMUNITY)
Admission: RE | Admit: 2014-08-05 | Discharge: 2014-08-05 | Disposition: A | Discharge status: Home / Self Care | Payer: Medicare Other | Source: Ambulatory Visit | Attending: Cardiovascular Disease | Admitting: Cardiovascular Disease

## 2014-08-05 DIAGNOSIS — I2510 Atherosclerotic heart disease of native coronary artery without angina pectoris: Secondary | ICD-10-CM | POA: Diagnosis not present

## 2014-08-05 DIAGNOSIS — Z8546 Personal history of malignant neoplasm of prostate: Secondary | ICD-10-CM | POA: Insufficient documentation

## 2014-08-05 DIAGNOSIS — F17210 Nicotine dependence, cigarettes, uncomplicated: Secondary | ICD-10-CM | POA: Insufficient documentation

## 2014-08-05 DIAGNOSIS — I714 Abdominal aortic aneurysm, without rupture: Secondary | ICD-10-CM | POA: Insufficient documentation

## 2014-08-05 DIAGNOSIS — E785 Hyperlipidemia, unspecified: Secondary | ICD-10-CM | POA: Diagnosis not present

## 2014-08-05 DIAGNOSIS — I739 Peripheral vascular disease, unspecified: Secondary | ICD-10-CM

## 2014-08-05 DIAGNOSIS — I252 Old myocardial infarction: Secondary | ICD-10-CM | POA: Diagnosis not present

## 2014-08-05 DIAGNOSIS — F419 Anxiety disorder, unspecified: Secondary | ICD-10-CM | POA: Diagnosis not present

## 2014-08-05 DIAGNOSIS — I10 Essential (primary) hypertension: Secondary | ICD-10-CM | POA: Diagnosis not present

## 2014-08-05 DIAGNOSIS — Z7982 Long term (current) use of aspirin: Secondary | ICD-10-CM | POA: Insufficient documentation

## 2014-08-05 DIAGNOSIS — Z951 Presence of aortocoronary bypass graft: Secondary | ICD-10-CM | POA: Diagnosis not present

## 2014-08-05 DIAGNOSIS — I70213 Atherosclerosis of native arteries of extremities with intermittent claudication, bilateral legs: Secondary | ICD-10-CM | POA: Diagnosis not present

## 2014-08-05 HISTORY — PX: PERIPHERAL VASCULAR CATHETERIZATION: SHX172C

## 2014-08-05 SURGERY — ABDOMINAL AORTOGRAM W/LOWER EXTREMITY
Anesthesia: LOCAL

## 2014-08-05 MED ORDER — SODIUM CHLORIDE 0.9 % IJ SOLN: 3.0000 mL | INTRAMUSCULAR | Status: DC | PRN

## 2014-08-05 MED ORDER — ASPIRIN 81 MG PO CHEW: 81.0000 mg | CHEWABLE_TABLET | ORAL | Status: DC

## 2014-08-05 MED ORDER — FENTANYL CITRATE (PF) 100 MCG/2ML IJ SOLN
INTRAMUSCULAR | Status: AC
  Filled 2014-08-05: qty 2

## 2014-08-05 MED ORDER — IODIXANOL 320 MG/ML IV SOLN
INTRAVENOUS | Status: DC | PRN
  Administered 2014-08-05: 105 mL via INTRAVENOUS

## 2014-08-05 MED ORDER — SODIUM CHLORIDE 0.9 % IJ SOLN: 3.0000 mL | Freq: Two times a day (BID) | INTRAMUSCULAR | Status: DC

## 2014-08-05 MED ORDER — SODIUM CHLORIDE 0.9 % IV SOLN
INTRAVENOUS | Status: DC
  Administered 2014-08-05: 07:00:00 via INTRAVENOUS

## 2014-08-05 MED ORDER — FENTANYL CITRATE (PF) 100 MCG/2ML IJ SOLN
INTRAMUSCULAR | Status: DC | PRN
  Administered 2014-08-05: 50 ug via INTRAVENOUS

## 2014-08-05 MED ORDER — HEPARIN (PORCINE) IN NACL 2-0.9 UNIT/ML-% IJ SOLN
INTRAMUSCULAR | Status: AC
  Filled 2014-08-05: qty 1000

## 2014-08-05 MED ORDER — MIDAZOLAM HCL 2 MG/2ML IJ SOLN
INTRAMUSCULAR | Status: AC
  Filled 2014-08-05: qty 2

## 2014-08-05 MED ORDER — SODIUM CHLORIDE 0.9 % IV SOLN: 250.0000 mL | INTRAVENOUS | Status: DC | PRN

## 2014-08-05 MED ORDER — MIDAZOLAM HCL 2 MG/2ML IJ SOLN
INTRAMUSCULAR | Status: DC | PRN
  Administered 2014-08-05: 1 mg via INTRAVENOUS

## 2014-08-05 MED ORDER — LIDOCAINE HCL (PF) 1 % IJ SOLN
INTRAMUSCULAR | Status: AC
  Filled 2014-08-05: qty 30

## 2014-08-05 MED ORDER — SODIUM CHLORIDE 0.9 % WEIGHT BASED INFUSION: 1.0000 mL/kg/h | INTRAVENOUS | Status: AC

## 2014-08-05 SURGICAL SUPPLY — 14 items
CATH ANGIO 5F PIGTAIL 65CM (CATHETERS) ×2 IMPLANT
CATH SOFTOUCH MOTARJEME 5F (CATHETERS) ×2 IMPLANT
COVER PRB 48X5XTLSCP FOLD TPE (BAG) ×1 IMPLANT
COVER PROBE 5X48 (BAG) ×2
HAND CONTROLLER AVANTA (MISCELLANEOUS) ×2 IMPLANT
KIT PV (KITS) ×2 IMPLANT
SET AVANTA MULTI PATIENT (MISCELLANEOUS) IMPLANT
SET AVANTA SINGLE PATIENT (MISCELLANEOUS) ×2 IMPLANT
SHEATH AVANTA HAND CONTROLLER (MISCELLANEOUS) ×2 IMPLANT
SHEATH PINNACLE 5F 10CM (SHEATH) ×2 IMPLANT
SYR MEDRAD MARK V 150ML (SYRINGE) ×2 IMPLANT
TRANSDUCER W/STOPCOCK (MISCELLANEOUS) ×2 IMPLANT
TRAY PV CATH (CUSTOM PROCEDURE TRAY) ×2 IMPLANT
WIRE HITORQ VERSACORE ST 145CM (WIRE) ×2 IMPLANT

## 2014-08-05 NOTE — Consult Note (Signed)
Hospital Consult  Reason for consult: Lower extremity claudication Consulting physician: Dr. Fletcher Anon  History of Present Illness  Steve Baker is a 73 y.o. (1940/12/18) male with a complex medical history who presents with chief complaint: bilateral lower extremity claudication, left much worse than right. He underwent abdominal aortogram with bilateral runoff with Dr. Fletcher Anon today. He previously had an aortobifemoral bypass by Dr. Donnetta Hutching in 2001. He is able to ambulate for 30 ft before having severe claudication in his left calf. This has been going on for several years and has worsened in the past two years. He denies any history of non-healing wounds. The patient reports some rare numbness in his left foot at night but no obvious rest pain. Atherosclerotic risk factors include: CAD, hypertension, hyperlipidemia, current smoker.  He was recently diagnosed with pulmonary adenocarcinoma. He will see a thoracic oncologist next Thursday 08/13/14. He has a history of CAD s/p CABG in 2001. He recently underwent cardiac catheterization on  07/16/14 which revealed left ventricle dysfunction and 2 out of 4 patent bypass grafts.   On ROS, he admits to SOB with exertion and sometimes at rest. He denies any chest pain or chest pressure. Full ROS below.   Past Medical History  Diagnosis Date  . Hypertension   . Hyperlipidemia   . Irregular heart beat     Pt stated his heart skips a beat every now and then  . Shortness of breath dyspnea     with ambulation  . Peripheral vascular disease   . Anxiety   . Cancer     prostate; seed insertion   2001    Past Surgical History  Procedure Laterality Date  . Cholecystectomy    . Heart bypass    . Cardiac catheterization  2001  . Skin cancer excision N/A     Nose  . Abdominal aortic aneurysm repair    . Eye surgery Bilateral     cataract removal  . Colonoscopy w/ polypectomy    . Cardiac catheterization N/A 07/16/2014    Procedure: Right/Left Heart Cath and  Coronary Angiography;  Surgeon: Burnell Blanks, MD;  Location: [facility];  Service: Cardiovascular;  Laterality: N/A;  . Coronary artery bypass graft    . Video bronchoscopy with endobronchial navigation N/A 07/29/2014    Procedure: VIDEO BRONCHOSCOPY WITH ENDOBRONCHIAL NAVIGATION;  Surgeon: Collene Gobble, MD;  Location: [facility];  Service: Thoracic;  Laterality: N/A;    History   Social History  . Marital Status: Married    Spouse Name: nancy  . Number of Children: 2  . Years of Education: college   Occupational History  . retired    Social History Main Topics  . Smoking status: Current Every Day Smoker -- 2.00 packs/day for 56 years    Types: Cigarettes  . Smokeless tobacco: Not on file  . Alcohol Use: 1.8 oz/week    3 Cans of beer, 0 Standard drinks or equivalent per week     Comment: daily  . Drug Use: No  . Sexual Activity: Not on file   Other Topics Concern  . Not on file   Social History Narrative    Family History  Problem Relation Age of Onset  . Heart Problems Mother   . Heart attack Father   . Stroke Neg Hx     No current facility-administered medications on file prior to encounter.   Current Outpatient Prescriptions on File Prior to Encounter  Medication Sig Dispense Refill  .  aspirin EC 81 MG tablet Take 81 mg by mouth daily.    . furosemide (LASIX) 40 MG tablet Take 40 mg by mouth as needed for edema.   0  . lisinopril (PRINIVIL,ZESTRIL) 20 MG tablet Take 20 mg by mouth daily.    . pravastatin (PRAVACHOL) 40 MG tablet Take 40 mg by mouth daily.    . Tiotropium Bromide Monohydrate (SPIRIVA RESPIMAT) 2.5 MCG/ACT AERS Inhale 2 puffs into the lungs daily. 1 Inhaler 4    No Known Allergies  REVIEW OF SYSTEMS:  (Positives checked otherwise negative)  CARDIOVASCULAR:  '[ ]'$  chest pain, '[ ]'$  chest pressure, '[ ]'$  palpitations, '[ ]'$  shortness of breath when laying flat, '[ ]'$  shortness of breath with exertion,   [x ] pain in legs when walking, '[ ]'$   pain in feet when laying flat, '[ ]'$  history of blood clot in veins (DVT), '[ ]'$  history of phlebitis, '[ ]'$  swelling in legs, '[ ]'$  varicose veins  PULMONARY:  '[ ]'$  productive cough, '[ ]'$  asthma, '[ ]'$  wheezing  NEUROLOGIC:  '[ ]'$  weakness in arms or legs, [x ] numbness left foot, '[ ]'$  difficulty speaking or slurred speech, '[ ]'$  temporary loss of vision in one eye, '[ ]'$  dizziness  HEMATOLOGIC:  '[ ]'$  bleeding problems, '[ ]'$  problems with blood clotting too easily  MUSCULOSKEL:  '[ ]'$  joint pain, '[ ]'$  joint swelling  GASTROINTEST:  '[ ]'$   Vomiting blood, '[ ]'$   Blood in stool     GENITOURINARY:  '[ ]'$   Burning with urination, '[ ]'$   Blood in urine  PSYCHIATRIC:  '[ ]'$  history of major depression  INTEGUMENTARY:  '[ ]'$  rashes, '[ ]'$  ulcers  CONSTITUTIONAL:  '[ ]'$  fever, '[ ]'$  chills  For VQI Use Only   PRE-ADM LIVING: Home  AMB STATUS: Ambulatory  CAD Sx: History of MI, but no symptoms No MI within 6 months  PRIOR CHF: None  STRESS TEST: '[ ]'$  No, '[ ]'$  Normal, '[ ]'$  + ischemia, '[ ]'$  + MI, '[ ]'$  Both   Physical Examination Filed Vitals:   08/05/14 0940 08/05/14 0945 08/05/14 1008 08/05/14 1018  BP: 130/70 131/67 135/68 103/84  Pulse: 70 71 97 93  Temp:      TempSrc:      Resp: 13 18    Height:      Weight:      SpO2: 95% 89% 93% 94%   Body mass index is 29.2 kg/(m^2).  General: A&O x 3, WDWN in NAD  Head: [place]/A  Neck: Supple, no nuchal rigidity  Pulmonary: Sym exp, good air movt, clear to auscultation anterior   Cardiac: RRR, Nl S1, S2, no Murmurs, rubs or gallops, no carotid bruits  Vascular: 2+ rightt radial pulse, non palpable left radial pulse. Right groin sheath site without hematoma. Left femoral 2+. 1+ right dorsalis pedis pulse. Non palpable left pedal pulses.   Gastrointestinal: soft, NTND, -G/R, - HSM, - masses  Musculoskeletal: M/S 5/5 throughout. Extremities without ischemic changes. Rubor of bilateral toes.   Neurologic: CN 2-12 grossly intact. Pain and light touch intact in extremities,  Motor exam as listed above  Psychiatric: Judgment intact, Mood & affect appropriatefor pt's clinical situation  Dermatologic: See M/S exam for extremity exam, no rashes otherwise noted  Medical Decision Making  Maxi Kutner is a 74 y.o. male who presents with: bilateral lifestyle limiting intermittent claudication, left significantly greater than right. He underwent aortogram with bilateral runoff with Dr. Fletcher Anon which revealed options for  revascularization. The patient has a recent diagnosis of pulmonary adenocarcinoma. He will see a thoracic oncologist next Thursday 08/13/14. Discussed that he is not at current risk for limb loss and may proceed with revascularization on an elective basis or just continue with observation. The patient would like to proceed with surgery for his severe claudication but was advised to see the thoracic oncologist first. We will formulate plans for surgery afterwards. Dr. Donnetta Hutching to see patient.   Virgina Jock, PA-C [facility] Office: [phone] Pager: [phone]  08/05/2014, 10:54 AM    I have examined the patient, reviewed and agree with above. 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 Presents with severe claudication left calf and lesser so in his right calf. Rest pain or tissue loss. Has recent diagnosis of adenocarcinoma of his lung and is to see the thoracic oncologist in one week. Also status post coronary bypass grafting in 2001. Had harvest of his left vein from his knee to his ankle from physical exam standpoint.  Exam reveals easily palpable femoral pulses bilaterally with no false aneurysms present. No popliteal or distal pulses bilaterally  I reviewed his heart aortogram with bilateral extremity runoff from today and discussed at length with the patient and his wife present he does have occlusion of his left common femoral artery distal to the prior limb of  his aortofemoral bypass. He has a occlusion of his proximal deep femoral artery and occlusion of the superficial femoral artery. He does have reconstitution of the above-knee popliteal artery. There is a significant irregularity and calcification is tibioperoneal trunk on the left but does have patent vessels with 3 vessel runoff. On the right side he has a normal flow into the profunda with SFA occlusion.   That this is not limb threatening. He certainly is extremely incapacitated due to his left leg claudication which is related to no flow to his proximal profunda or superficial femoral artery. Do feel that he would be a good candidate for left common femoral endarterectomy with establishing flow to the deep femoral artery and also left femoral-popliteal bypass in the same setting. I explained that we would certainly defer decision to this until he has had the discussion with the thoracic oncologist related to his recent new diagnosis. The patient and family are comfortable with this decision. Will await further word from the oncologist pending this  Curt Jews, MD 08/05/2014 2:06 PM

## 2014-08-05 NOTE — Progress Notes (Signed)
Site area: right groin a 5 french arterial sheath was removed  Site Prior to Removal:  Level 0  Pressure Applied For 15 MINUTES    Minutes Beginning at 0930a  Manual:   Yes.    Patient Status During Pull:  stable  Post Pull Groin Site:  Level 1  Post Pull Instructions Given:  Yes.    Post Pull Pulses Present:  Yes.    Dressing Applied:  Yes.    Comments:  VS remain stable during sheath pull.  Pt denies any discomfort at site at this time

## 2014-08-05 NOTE — Interval H&P Note (Signed)
History and Physical Interval Note:  08/05/2014 8:22 AM  Steve Baker  has presented today for surgery, with the diagnosis of pad  The various methods of treatment have been discussed with the patient and family. After consideration of risks, benefits and other options for treatment, the patient has consented to  Procedure(s): Abdominal Aortogram w/Lower Extremity (N/A) as a surgical intervention .  The patient's history has been reviewed, patient examined, no change in status, stable for surgery.  I have reviewed the patient's chart and labs.  Questions were answered to the patient's satisfaction.     Kathlyn Sacramento

## 2014-08-05 NOTE — H&P (View-Only) (Signed)
Primary care physician: Dr. Arelia Sneddon Primary cardiologist: Dr. Radford Pax  HPI  This is a 74 year old male who was referred for evaluation of recently diagnosed peripheral arterial disease. He is an active smoker (84 pk-yrs) with history of CAD by cath 2001 with subsequent CABG, abdominal aneurysm repair with previous aortobifemoral bypass, hypertension, prostate CA (2001) and hyperlipidemia. He underwent a screening CT scan of the chest 06/04/14 ordered by Dr. Arelia Sneddon that showed a dominant right upper lobe mass and a smaller left lower lobe nodule. He was evaluated in the lung cancer screening program and a PET scan has been obtained 06/18/14 > both lesions are hypermetabolic, as was a slightly enlarged right peritracheal node. The patient is suspected of having lung cancer and he is undergoing bronchoscopy tomorrow. For pre-op evaluation, he underwent a nuclear stress test recently which showed an ejection fraction of 24% with evidence of prior myocardial infarction in the inferior lateral region. He underwent cardiac catheterization via the right femoral artery which showed severe native three-vessel coronary artery disease with 2 out of 4 patent bypass grafts. Medical therapy was recommended. The patient has been complaining of progressive left calf pain which is currently happening with minimal walking even just 20 feet. He also has right calf claudication but not as severe. He has no rest pain or lower extremity ulceration. He underwent noninvasive vascular evaluation yesterday which showed patent aortobifemoral bypass. ABI was severely reduced on the left side at 0.1. Was 0.6 on the right. There was evidence of long occlusion of the left common femoral artery, profunda and proximal to mid SFA with reconstitution via collaterals of the distal SFA with three-vessel runoff.  No Known Allergies   Current Outpatient Prescriptions on File Prior to Visit  Medication Sig Dispense Refill  . aspirin EC 81 MG  tablet Take 81 mg by mouth daily.    . furosemide (LASIX) 40 MG tablet Take 40 mg by mouth as needed for edema.   0  . lisinopril (PRINIVIL,ZESTRIL) 20 MG tablet Take 20 mg by mouth daily.    . pravastatin (PRAVACHOL) 40 MG tablet Take 40 mg by mouth daily.    . Tiotropium Bromide Monohydrate (SPIRIVA RESPIMAT) 2.5 MCG/ACT AERS Inhale 2 puffs into the lungs daily. 1 Inhaler 4   No current facility-administered medications on file prior to visit.     Past Medical History  Diagnosis Date  . Hypertension   . Hyperlipidemia   . Irregular heart beat     Pt stated his heart skips a beat every now and then  . Shortness of breath dyspnea     with ambulation  . Peripheral vascular disease   . Anxiety   . Cancer     prostate; seed insertion   2001     Past Surgical History  Procedure Laterality Date  . Cholecystectomy    . Heart bypass    . Cardiac catheterization  2001  . Skin cancer excision N/A     Nose  . Abdominal aortic aneurysm repair    . Eye surgery Bilateral     cataract removal  . Colonoscopy w/ polypectomy    . Cardiac catheterization N/A 07/16/2014    Procedure: Right/Left Heart Cath and Coronary Angiography;  Surgeon: Burnell Blanks, MD;  Location: [facility];  Service: Cardiovascular;  Laterality: N/A;  . Coronary artery bypass graft       Family History  Problem Relation Age of Onset  . Heart Problems Mother   . Heart  attack Father   . Stroke Neg Hx      History   Social History  . Marital Status: Married    Spouse Name: nancy  . Number of Children: 2  . Years of Education: college   Occupational History  . retired    Social History Main Topics  . Smoking status: Current Every Day Smoker -- 2.00 packs/day for 56 years    Types: Cigarettes  . Smokeless tobacco: Not on file  . Alcohol Use: 1.8 oz/week    3 Cans of beer, 0 Standard drinks or equivalent per week     Comment: daily  . Drug Use: No  . Sexual Activity: Not on file    Other Topics Concern  . Not on file   Social History Narrative     ROS A 10 point review of system was performed. It is negative other than that mentioned in the history of present illness.   PHYSICAL EXAM   BP 142/68 mmHg  Pulse 80  Ht '5\' 8"'$  (1.727 m)  Wt 188 lb (85.276 kg)  BMI 28.59 kg/m2 Constitutional: He is oriented to person, place, and time. He appears well-developed and well-nourished. No distress.  HENT: No nasal discharge.  Head: Normocephalic and atraumatic.  Eyes: Pupils are equal and round.  No discharge. Neck: Normal range of motion. Neck supple. No JVD present. No thyromegaly present.  Cardiovascular: Normal rate, regular rhythm, normal heart sounds. Exam reveals no gallop and no friction rub. No murmur heard.  Pulmonary/Chest: Effort normal and breath sounds normal. No stridor. No respiratory distress. He has no wheezes. He has no rales. He exhibits no tenderness.  Abdominal: Soft. Bowel sounds are normal. He exhibits no distension. There is no tenderness. There is no rebound and no guarding.  Musculoskeletal: Normal range of motion. He exhibits no edema and no tenderness.  Neurological: He is alert and oriented to person, place, and time. Coordination normal.  Skin: Skin is warm and dry. No rash noted. He is not diaphoretic. No erythema. No pallor.  Psychiatric: He has a normal mood and affect. His behavior is normal. Judgment and thought content normal.  Vascular: Femoral pulses are normal bilaterally. Distal pulses are not palpable. There is dependent rubor.      ASSESSMENT AND PLAN

## 2014-08-05 NOTE — Discharge Instructions (Signed)

## 2014-08-06 ENCOUNTER — Encounter (HOSPITAL_COMMUNITY): Payer: Self-pay | Admitting: Cardiovascular Disease

## 2014-08-06 MED FILL — Heparin Sodium (Porcine) 2 Unit/ML in Sodium Chloride 0.9%: INTRAMUSCULAR | Qty: 1000 | Status: AC

## 2014-08-06 MED FILL — Lidocaine HCl Local Preservative Free (PF) Inj 1%: INTRAMUSCULAR | Qty: 30 | Status: AC

## 2014-08-10 ENCOUNTER — Telehealth: Payer: Self-pay | Admitting: *Deleted

## 2014-08-10 ENCOUNTER — Other Ambulatory Visit: Payer: Self-pay | Admitting: *Deleted

## 2014-08-10 DIAGNOSIS — R918 Other nonspecific abnormal finding of lung field: Secondary | ICD-10-CM

## 2014-08-10 NOTE — Telephone Encounter (Signed)
Oncology Nurse Navigator Documentation  Oncology Nurse Navigator Flowsheets 08/10/2014  Referral date to RadOnc/MedOnc   Navigator Encounter Type Other/Phone Call   Coordination of Care MD Appointments/Due to cancer conference update, I had to change patients appt.  I called and spoke with wife, she is aware of appt time change.    Time Spent with Patient 15

## 2014-08-11 ENCOUNTER — Encounter: Payer: Self-pay | Admitting: Emergency Medicine

## 2014-08-11 ENCOUNTER — Ambulatory Visit (INDEPENDENT_AMBULATORY_CARE_PROVIDER_SITE_OTHER): Payer: Medicare Other | Admitting: Emergency Medicine

## 2014-08-11 VITALS — BP 142/78 | HR 97 | Ht 68.0 in | Wt 186.0 lb

## 2014-08-11 DIAGNOSIS — J449 Chronic obstructive pulmonary disease, unspecified: Secondary | ICD-10-CM

## 2014-08-11 DIAGNOSIS — C3491 Malignant neoplasm of unspecified part of right bronchus or lung: Secondary | ICD-10-CM | POA: Diagnosis not present

## 2014-08-11 DIAGNOSIS — Z720 Tobacco use: Secondary | ICD-10-CM

## 2014-08-11 NOTE — Patient Instructions (Addendum)
Please continue to work on decreasing her cigarettes. We discussed techniques to do this today. If we can help you with medications we are happy to do so. Please call us if we can help.  We will perform full pulmonary function testing Please continue Spiriva once a day Attend the [facility] as planned on Thursday.  Follow with Dr Lamonte Sakai in 3 months or sooner if you have any problems.

## 2014-08-11 NOTE — Assessment & Plan Note (Signed)
His right upper lobe cytology is positive for adenocarcinoma. Based on his CT chest and PET scan this is either 2 synchronous primaries or stage IV disease. I suspect the former. Even so it is not clear to me that he is a surgical candidate based on his cardiac disease, overall function, and underlying lung disease. His continued tobacco abuse is also a factor. I have referred him to thoracic oncology and suspect that he'll be best treated with chemotherapy and/or stereotactic radiation. Fiducials were placed to facilitate XRT. I will perform full PFT

## 2014-08-11 NOTE — Progress Notes (Signed)
Subjective:    Patient ID: Steve Baker, male    DOB: 1940-09-21, 74 y.o.   MRN: [id number]  HPI 74 year old active smoker (26 pk-yrs) with history of CAD/CABG, abdominal aneurysm repair, hypertension, prostate CA (2001), hyperlipidemia. He underwent a screening CT scan of the chest 06/04/14 ordered by Dr. Arelia Sneddon that showed a dominant right upper lobe mass and a smaller left lower lobe nodule. He was evaluated in the lung cancer screening program and a PET scan has been obtained 06/18/14 > both lesions are hypermetabolic, as was a slightly enlarged right peritracheal node. There was evidence of pharyngeal esophageal activity without a corresponding anatomical abnormality on CT scan.   He denies any hemoptysis although he has had frequent cough. He has dyspnea that appears to be stable. He is not on any bronchodilators at this time. He has about 6 pounds of unintentional weight loss over the last month.   ROV 08/11/14 -- follow-up visit for tobacco use, presumed COPD, and newly diagnosed adenocarcinoma of the lung. This was obtained from biopsy of his right upper lobe mass. He also had a smaller left lower lobe nodule. Cytology on this was not definitive for malignancy. I placed fiducial markers in the right upper lobe mass and also triangulating the left lower lobe nodule. He has a mediastinal node that is hypermetabolic on PET scan. I considered biopsying this but I do not believe it'll change his management because he is a poor surgical candidate based on his clinical status. He is scheduled for [place] this Thursday.  Hx taken from the pt and his wife. He is still having cough, has some exertional SOB.    Review of Systems As per HPI   Past Medical History  Diagnosis Date  . Hypertension   . Hyperlipidemia   . Irregular heart beat     Pt stated his heart skips a beat every now and then  . Shortness of breath dyspnea     with ambulation  . Peripheral vascular disease   . Anxiety   . Cancer    prostate; seed insertion   2001     Family History  Problem Relation Age of Onset  . Heart Problems Mother   . Heart attack Father   . Stroke Neg Hx      History   Social History  . Marital Status: Married    Spouse Name: nancy  . Number of Children: 2  . Years of Education: college   Occupational History  . retired    Social History Main Topics  . Smoking status: Current Every Day Smoker -- 2.00 packs/day for 56 years    Types: Cigarettes  . Smokeless tobacco: Not on file  . Alcohol Use: 1.8 oz/week    3 Cans of beer, 0 Standard drinks or equivalent per week     Comment: daily  . Drug Use: No  . Sexual Activity: Not on file   Other Topics Concern  . 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 aspirin EC 81 MG tablet Take 81 mg by mouth daily.    . furosemide (LASIX) 40 MG tablet Take 40 mg by mouth as needed for edema.   0  . lisinopril (PRINIVIL,ZESTRIL) 20 MG tablet Take 20 mg by mouth daily.    Marland Kitchen  pravastatin (PRAVACHOL) 40 MG tablet Take 40 mg by mouth daily.    . Tiotropium Bromide Monohydrate (SPIRIVA RESPIMAT) 2.5 MCG/ACT AERS Inhale 2 puffs into the lungs daily. 1 Inhaler 4   No facility-administered medications prior to visit.       Objective:   Physical Exam Filed Vitals:   08/11/14 1008 08/11/14 1009  BP:  142/78  Pulse:  97  Height: '5\' 8"'$  (1.727 m)   Weight: 186 lb (84.369 kg)   SpO2:  97%   Gen: Pleasant, well-nourished, in no distress,  normal affect  ENT: No lesions,  Tongue is blackened with cigarette tar,  no postnasal drip  Neck: No JVD, no TMG, no carotid bruits  Lungs: No use of accessory muscles,  clear without rales or rhonchi  Cardiovascular: RRR, heart sounds normal, no murmur or gallops, no peripheral edema  Musculoskeletal: No  deformities, no cyanosis or clubbing. He does have nicotine staining on his nails  Neuro: alert, non focal  Skin: Warm, no lesions or rashes     Assessment & Plan:  Adenocarcinoma of right lung His right upper lobe cytology is positive for adenocarcinoma. Based on his CT chest and PET scan this is either 2 synchronous primaries or stage IV disease. I suspect the former. Even so it is not clear to me that he is a surgical candidate based on his cardiac disease, overall function, and underlying lung disease. His continued tobacco abuse is also a factor. I have referred him to thoracic oncology and suspect that he'll be best treated with chemotherapy and/or stereotactic radiation. Fiducials were placed to facilitate XRT. I will perform full PFT   Tobacco abuse Discussed cessation in detail today   COPD (chronic obstructive pulmonary disease) I will continue Spiriva and check full PFTs. He needs to stop smoking we discussed this in detail today. He would like to continue trial his own without any medication. We will revisit this at our next visit

## 2014-08-11 NOTE — Assessment & Plan Note (Signed)
I will continue Spiriva and check full PFTs. He needs to stop smoking we discussed this in detail today. He would like to continue trial his own without any medication. We will revisit this at our next visit

## 2014-08-11 NOTE — Assessment & Plan Note (Signed)
Discussed cessation in detail today

## 2014-08-12 ENCOUNTER — Telehealth: Payer: Self-pay | Admitting: *Deleted

## 2014-08-12 ENCOUNTER — Ambulatory Visit (HOSPITAL_COMMUNITY)
Admission: RE | Admit: 2014-08-12 | Discharge: 2014-08-12 | Disposition: A | Discharge status: Home / Self Care | Payer: Medicare Other | Source: Ambulatory Visit | Attending: Emergency Medicine | Admitting: Emergency Medicine

## 2014-08-12 DIAGNOSIS — R0609 Other forms of dyspnea: Secondary | ICD-10-CM | POA: Insufficient documentation

## 2014-08-12 DIAGNOSIS — R05 Cough: Secondary | ICD-10-CM | POA: Insufficient documentation

## 2014-08-12 DIAGNOSIS — F17210 Nicotine dependence, cigarettes, uncomplicated: Secondary | ICD-10-CM | POA: Diagnosis not present

## 2014-08-12 DIAGNOSIS — Z720 Tobacco use: Secondary | ICD-10-CM

## 2014-08-12 LAB — PULMONARY FUNCTION TEST
DL/VA % PRED: 35 %
DL/VA: 1.59 ml/min/mmHg/L
DLCO unc % pred: 34 %
DLCO unc: 10.2 ml/min/mmHg
FEF 25-75 POST: 1.79 L/s
FEF 25-75 PRE: 1.55 L/s
FEF2575-%Change-Post: 15 %
FEF2575-%PRED-POST: 86 %
FEF2575-%Pred-Pre: 74 %
FEV1-%Change-Post: 6 %
FEV1-%Pred-Post: 81 %
FEV1-%Pred-Pre: 76 %
FEV1-PRE: 2.18 L
FEV1-Post: 2.33 L
FEV1FVC-%Change-Post: 3 %
FEV1FVC-%PRED-PRE: 96 %
FEV6-%Change-Post: -3 %
FEV6-%Pred-Post: 78 %
FEV6-%Pred-Pre: 81 %
FEV6-Post: 2.9 L
FEV6-Pre: 2.99 L
FEV6FVC-%CHANGE-POST: 0 %
FEV6FVC-%PRED-PRE: 103 %
FEV6FVC-%Pred-Post: 103 %
FVC-%CHANGE-POST: 3 %
FVC-%Pred-Post: 81 %
FVC-%Pred-Pre: 78 %
FVC-Post: 3.2 L
FVC-Pre: 3.1 L
PRE FEV1/FVC RATIO: 70 %
PRE FEV6/FVC RATIO: 97 %
Post FEV1/FVC ratio: 73 %
Post FEV6/FVC ratio: 97 %
RV % pred: 91 %
RV: 2.23 L
TLC % pred: 85 %
TLC: 5.65 L

## 2014-08-12 MED ORDER — ALBUTEROL SULFATE (2.5 MG/3ML) 0.083% IN NEBU
2.5000 mg | INHALATION_SOLUTION | Freq: Once | RESPIRATORY_TRACT | Status: AC
  Administered 2014-08-12: 2.5 mg via RESPIRATORY_TRACT

## 2014-08-12 NOTE — Telephone Encounter (Signed)
Called and left a message w/ a friendly reminder about clinic tomorrow 6/9 w/ directions and instructions.

## 2014-08-13 ENCOUNTER — Ambulatory Visit (HOSPITAL_BASED_OUTPATIENT_CLINIC_OR_DEPARTMENT_OTHER): Payer: Medicare Other | Admitting: Internal Medicine

## 2014-08-13 ENCOUNTER — Encounter: Payer: Self-pay | Admitting: *Deleted

## 2014-08-13 ENCOUNTER — Ambulatory Visit
Admission: RE | Admit: 2014-08-13 | Discharge: 2014-08-13 | Disposition: A | Discharge status: Home / Self Care | Payer: Medicare Other | Source: Ambulatory Visit | Attending: Radiation Oncology | Admitting: Radiation Oncology

## 2014-08-13 ENCOUNTER — Ambulatory Visit: Payer: Medicare Other | Admitting: Physical Therapy

## 2014-08-13 ENCOUNTER — Other Ambulatory Visit (HOSPITAL_BASED_OUTPATIENT_CLINIC_OR_DEPARTMENT_OTHER): Payer: Medicare Other

## 2014-08-13 ENCOUNTER — Telehealth: Payer: Self-pay | Admitting: Internal Medicine

## 2014-08-13 VITALS — BP 144/60 | HR 84 | Temp 98.2°F | Resp 18 | Ht 68.0 in | Wt 185.3 lb

## 2014-08-13 DIAGNOSIS — C3411 Malignant neoplasm of upper lobe, right bronchus or lung: Secondary | ICD-10-CM

## 2014-08-13 DIAGNOSIS — I739 Peripheral vascular disease, unspecified: Secondary | ICD-10-CM

## 2014-08-13 DIAGNOSIS — D509 Iron deficiency anemia, unspecified: Secondary | ICD-10-CM

## 2014-08-13 DIAGNOSIS — C3491 Malignant neoplasm of unspecified part of right bronchus or lung: Secondary | ICD-10-CM

## 2014-08-13 DIAGNOSIS — Z720 Tobacco use: Secondary | ICD-10-CM | POA: Diagnosis not present

## 2014-08-13 DIAGNOSIS — R918 Other nonspecific abnormal finding of lung field: Secondary | ICD-10-CM

## 2014-08-13 LAB — COMPREHENSIVE METABOLIC PANEL (CC13)
ALK PHOS: 80 U/L (ref 40–150)
ALT: <6 U/L (ref 0–55)
ANION GAP: 7 meq/L (ref 3–11)
AST: 21 U/L (ref 5–34)
Albumin: 3.5 g/dL (ref 3.5–5.0)
BUN: 14.4 mg/dL (ref 7.0–26.0)
CALCIUM: 8.8 mg/dL (ref 8.4–10.4)
CHLORIDE: 103 meq/L (ref 98–109)
CO2: 25 mEq/L (ref 22–29)
Creatinine: 0.9 mg/dL (ref 0.7–1.3)
EGFR: 86 mL/min/{1.73_m2} — ABNORMAL LOW (ref >90)
Glucose: 88 mg/dl (ref 70–140)
POTASSIUM: 4.3 meq/L (ref 3.5–5.1)
SODIUM: 135 meq/L — AB (ref 136–145)
Total Bilirubin: 0.31 mg/dL (ref 0.20–1.20)
Total Protein: 6.5 g/dL (ref 6.4–8.3)

## 2014-08-13 LAB — CBC WITH DIFFERENTIAL/PLATELET
BASO%: 1.1 % (ref 0.0–2.0)
BASOS ABS: 0 10*3/uL (ref 0.0–0.1)
EOS%: 4.7 % (ref 0.0–7.0)
Eosinophils Absolute: 0.2 10*3/uL (ref 0.0–0.5)
HEMATOCRIT: 29 % — AB (ref 38.4–49.9)
HGB: 8.3 g/dL — ABNORMAL LOW (ref 13.0–17.1)
LYMPH#: 0.9 10*3/uL (ref 0.9–3.3)
LYMPH%: 19.7 % (ref 14.0–49.0)
MCH: 18.4 pg — ABNORMAL LOW (ref 27.2–33.4)
MCHC: 28.8 g/dL — AB (ref 32.0–36.0)
MCV: 63.8 fL — ABNORMAL LOW (ref 79.3–98.0)
MONO#: 0.8 10*3/uL (ref 0.1–0.9)
MONO%: 18.7 % — ABNORMAL HIGH (ref 0.0–14.0)
NEUT#: 2.5 10*3/uL (ref 1.5–6.5)
NEUT%: 55.8 % (ref 39.0–75.0)
PLATELETS: 299 10*3/uL (ref 140–400)
RBC: 4.54 10*6/uL (ref 4.20–5.82)
RDW: 20.2 % — ABNORMAL HIGH (ref 11.0–14.6)
WBC: 4.5 10*3/uL (ref 4.0–10.3)

## 2014-08-13 NOTE — Progress Notes (Signed)
[facility] Telephone:([phone]) [phone]   [phone] Multidisciplinary thoracic oncology clinic  CONSULT NOTE  REFERRING PHYSICIAN: Dr. Baltazar Apo  REASON FOR CONSULTATION:  74 years old white male recently diagnosed with lung cancer.  HPI Steve Baker is a 74 y.o. male with past medical history significant for COPD, coronary artery disease, congestive heart failure, dyslipidemia and peripheral arterial disease currently under the care of Dr. Donnetta Hutching. In late March 2016 the patient was seen by his primary care physician Dr. Arelia Sneddon for routine physical examination and because of his long history of smoking who recommended for him to have a screening CT scan of the chest. This was performed on 06/04/2014 and it showed a large spiculated mass in the medial right upper lobe, contiguous with the anteromedial pleura. The volume derived mean diameter of this lesion is 3.9 cm. A second spiculated lesion is identified in the posteromedial left lower lobe. The volume derived mean diameter for this lesion is 1.4 cm. there was also 1.4 cm precarinal lymph nodes identified raising concern for mediastinal lymphadenopathy but no other lymphadenopathy seen in the mediastinum or either hilum. The patient was referred to Dr. Lamonte Sakai and a PET scan was performed on 06/18/2014 and it showed Hypermetabolic apical segment right upper lobe lung mass. Hypermetabolic left lower lobe pulmonary nodule. Faintly hypermetabolic lower right paratracheal lymph node. Assuming that the right upper lobe mass represents non-small cell lung cancer, this represents T2a N2 M1a (Stage IV) disease. On 07/29/2014 the patient underwent flexible video fiberoptic bronchoscopy with electromagnetic navigation and biopsies of the right upper lobe mass as well as a left lower lobe nodule under the care of Dr. Lamonte Sakai. The final pathology (Accession: [id number]) of the fine-needle aspiration of the right upper lobe mass showed  malignant cells consistent with adenocarcinoma. The bronchial brushing of the left lower lobe nodule showed atypical cells. Dr. Lamonte Sakai kindly referred the patient to me today for further evaluation and recommendation regarding treatment of his condition. He did not feel that the patient would be a good candidate for surgical resection based on his pulmonary function and other comorbidities. When seen today the patient continues to complain of weakness and fatigue as well as insomnia secondary to numbness and claudication in the left leg secondary to peripheral arterial disease. He was seen by Dr. Donnetta Hutching who is planning to perform surgery on the patient soon once we have a plan for his lung cancer.  The patient denied having any significant chest pain but continues to have shortness of breath at baseline and cough productive of clear sputum. He also lost few pounds recently but no night sweats. He has headache with no visual changes. Family history significant for a mother with Alzheimer, father died from heart disease at age 5 and sister had colon cancer. The patient is married and has 2 sons. He was accompanied today by his wife Steve Baker. He is currently retired and used to work in [employer], [employer]. He has a history of smoking 2 packs per day for around 56 years and unfortunately he continues to smoke at least one pack a day. He also drinks 3 beers every day and no history of drug abuse.  HPI  Past Medical History  Diagnosis Date  . Hypertension   . Hyperlipidemia   . Irregular heart beat     Pt stated his heart skips a beat every now and then  . Shortness of breath dyspnea     with ambulation  . Peripheral  vascular disease   . Anxiety   . Cancer     prostate; seed insertion   2001    Past Surgical History  Procedure Laterality Date  . Cholecystectomy    . Heart bypass    . Cardiac catheterization  2001  . Skin cancer excision N/A     Nose  . Abdominal aortic aneurysm repair    . Eye  surgery Bilateral     cataract removal  . Colonoscopy w/ polypectomy    . Cardiac catheterization N/A 07/16/2014    Procedure: Right/Left Heart Cath and Coronary Angiography;  Surgeon: Burnell Blanks, MD;  Location: [facility];  Service: Cardiovascular;  Laterality: N/A;  . Coronary artery bypass graft    . Video bronchoscopy with endobronchial navigation N/A 07/29/2014    Procedure: VIDEO BRONCHOSCOPY WITH ENDOBRONCHIAL NAVIGATION;  Surgeon: Collene Gobble, MD;  Location: [facility];  Service: Thoracic;  Laterality: N/A;  . Peripheral vascular catheterization N/A 08/05/2014    Procedure: Abdominal Aortogram w/Lower Extremity;  Surgeon: Wellington Hampshire, MD;  Location: [facility];  Service: Cardiovascular;  Laterality: N/A;    Family History  Problem Relation Age of Onset  . Heart Problems Mother   . Heart attack Father   . Stroke Neg Hx     Social History History  Substance Use Topics  . Smoking status: Current Every Day Smoker -- 2.00 packs/day for 56 years    Types: Cigarettes  . Smokeless tobacco: Not on file  . Alcohol Use: 1.8 oz/week    3 Cans of beer, 0 Standard drinks or equivalent per week     Comment: daily    No Known Allergies  Current Outpatient Prescriptions  Medication Sig Dispense Refill  . aspirin EC 81 MG tablet Take 81 mg by mouth daily.    . furosemide (LASIX) 40 MG tablet Take 40 mg by mouth as needed for edema.   0  . lisinopril (PRINIVIL,ZESTRIL) 20 MG tablet Take 20 mg by mouth daily.    . pravastatin (PRAVACHOL) 40 MG tablet Take 40 mg by mouth daily.    . Tiotropium Bromide Monohydrate (SPIRIVA RESPIMAT) 2.5 MCG/ACT AERS Inhale 2 puffs into the lungs daily. 1 Inhaler 4   No current facility-administered medications for this visit.    Review of Systems  Constitutional: positive for fatigue and weight loss Eyes: negative Ears, nose, mouth, throat, and face: negative Respiratory: positive for cough, dyspnea on exertion, sputum and  wheezing Cardiovascular: negative Gastrointestinal: negative Genitourinary:negative Integument/breast: negative Hematologic/lymphatic: negative Musculoskeletal:negative Neurological: negative Behavioral/Psych: negative Endocrine: negative Allergic/Immunologic: negative  Physical Exam  OFB:PZWCH, healthy, no distress, well nourished and well developed SKIN: skin color, texture, turgor are normal, no rashes or significant lesions HEAD: Normocephalic, No masses, lesions, tenderness or abnormalities EYES: normal, PERRLA, Conjunctiva are pink and non-injected EARS: External ears normal, Canals clear OROPHARYNX:no exudate, no erythema and lips, buccal mucosa, and tongue normal  NECK: supple, no adenopathy, no JVD LYMPH:  no palpable lymphadenopathy, no hepatosplenomegaly LUNGS: expiratory wheezes bilaterally HEART: regular rate & rhythm, no murmurs and no gallops ABDOMEN:abdomen soft, non-tender, obese, normal bowel sounds and no masses or organomegaly BACK: Back symmetric, no curvature., No CVA tenderness EXTREMITIES: 1+ edema  NEURO: alert & oriented x 3 with fluent speech, no focal motor/sensory deficits  PERFORMANCE STATUS: ECOG 1  LABORATORY DATA: Lab Results  Component Value Date   WBC 4.5 08/13/2014   HGB 8.3* 08/13/2014   HCT 29.0* 08/13/2014   MCV  63.8* 08/13/2014   PLT 299 08/13/2014      Chemistry      Component Value Date/Time   NA 134* 07/28/2014 1208   K 4.3 07/28/2014 1208   CL 103 07/28/2014 1208   CO2 26 07/28/2014 1208   BUN 16 07/28/2014 1208   CREATININE 0.76 07/28/2014 1208      Component Value Date/Time   CALCIUM 9.0 07/28/2014 1208       RADIOGRAPHIC STUDIES: Dg Chest Port 1 View  07/29/2014   CLINICAL DATA:  Status post bronchoscopy with biopsy  EXAM: PORTABLE CHEST - 1 VIEW  COMPARISON:  Chest CT June 29, 2014  FINDINGS: There is no demonstrable pneumothorax. The mass in the right apex with surrounding pneumonitis is again noted. The mass  in the right apex measures 4.3 x 3.0 cm. Elsewhere, there is mild atelectasis in the right base. The known mass in the left lower lobe is not convincingly seen on radiographic examination. There is no frank edema or consolidation. Heart is mildly enlarged with pulmonary vascularity within normal limits. Patient is status post coronary artery bypass grafting. No adenopathy. There is an old healed fracture of the left posterior seventh rib.  IMPRESSION: Right apical mass with surrounding pneumonitis appears essentially stable. The mass in the left lower lobe noted on recent CT is not appreciable on radiographic examination. No pneumothorax. No new areas of opacity beyond slight atelectasis in the right base. Heart mildly enlarged but stable.   Electronically Signed   By: Lowella Grip III M.D.   On: 07/29/2014 13:40   Dg C-arm Bronchoscopy  07/29/2014   CLINICAL DATA:    C-ARM BRONCHOSCOPY  Fluoroscopy was utilized by the requesting physician.  No radiographic  interpretation.     ASSESSMENT: This is a very pleasant 74 years old white male recently diagnosed with stage IV (T2a, N2, M1a) non-small cell lung cancer, adenocarcinoma diagnosed in May 2016 versus 2 synchronous primary tumor presenting with a stage IIIA (T2a, N2, M0) on the right and a stage IA (T1a, N0, M0) on the left.    PLAN: I had a lengthy discussion with the patient and his wife today about his current disease stage, prognosis and treatment options. His case was discussed at the weekly thoracic conference and it was recommended today with the patient the benefit of the doubt by treating him for 2 synchronous tumor with a course of concurrent chemoradiation to the right upper lobe lung mass and mediastinal lymphadenopathy followed by consolidation chemotherapy or stereotactic radiotherapy to the left lower lobe lesion. I will complete the staging workup by ordering a MRI of the brain to rule out brain metastasis. The patient agreed to  the current plan but he is very symptomatic from his peripheral arterial disease and would like to have the surgery performed as soon as possible. I spoke to Dr. Donnetta Hutching and he may consider the patient for surgery as early as next week. We will delay the start of his concurrent chemoradiation by 2-3 weeks until he recovers from the vascular surgery. The patient would be treated with a course of concurrent chemoradiation with weekly carboplatin for AUC of 2 and paclitaxel 45 MG/M2 for a total of 6-7 weeks depending on the final dose radiation. He was seen by Dr. Sondra Come noted for evaluation and discussion of the radiotherapy option. I will arrange for the patient follow-up appointment in 3 weeks for reevaluation and more detailed discussion of his concurrent chemoradiation. For smoke cessation, I strongly encouraged  the patient to quit smoking and offered him a smoke cessation program. The patient also has severe anemia most likely iron deficiency. I will call his pharmacy was prescription for Integra plus 1 capsule by mouth daily. The patient was seen during the multidisciplinary thoracic oncology clinic today by medical oncology, radiation oncology, thoracic navigator, social worker as well as physical therapist. He was advised to call immediately if he has any concerning symptoms in the interval.  The patient voices understanding of current disease status and treatment options and is in agreement with the current care plan.  All questions were answered. The patient knows to call the clinic with any problems, questions or concerns. We can certainly see the patient much sooner if necessary.  Thank you so much for allowing me to participate in the care of Steve Baker. I will continue to follow up the patient with you and assist in his care.  I spent 55 minutes counseling the patient face to face. The total time spent in the appointment was 80 minutes.  Disclaimer: This note was dictated with voice  recognition software. Similar sounding words can inadvertently be transcribed and may not be corrected upon review.   Oneta Sigman K. August 13, 2014, 3:01 PM

## 2014-08-13 NOTE — Progress Notes (Signed)
[facility] Clinical Social Work  Clinical Social Work met with patient/family and Futures trader at [facility] appointment to offer support and assess for psychosocial needs.  Medical oncologist reviewed patient's diagnosis and recommended treatment plan with patient/family.  Steve Baker was accompanied by his spouse- they have been married 68 years.  They have two sons.  The patient's main concern was receiving bypass surgery to address peripheral issues, he prefers to complete surgery before starting treatment.     Clinical Social Work briefly discussed Clinical Social Work role and [employer] support programs/services.  Clinical Social Work encouraged patient to call with any additional questions or concerns.   Polo Riley, MSW, LCSW, OSW-C Clinical Social Worker [facility] [phone]

## 2014-08-13 NOTE — Telephone Encounter (Signed)
added pt appts

## 2014-08-13 NOTE — Progress Notes (Signed)
Radiation Oncology         [phone] ________________________________  Initial Outpatient Consultation  Name: Steve Baker MRN: [id number]  Date: 08/13/2014  DOB: [birth year]  BZ:JIRCVE,LFYBOF OLIVER, MD  Collene Gobble, MD   REFERRING PHYSICIAN: Collene Gobble, MD  DIAGNOSIS: Stage III vs Stage IV non-small cell lung cancer  HISTORY OF PRESENT ILLNESS::Steve Baker is a 74 y.o. male who is presenting to the multidisciplinary clinic out of the courtesy of Dr. Baltazar Apo with Stage III vs Stage IV non-small cell lung cancer. The pt may have left leg vascular surgery soon, but doesn't remember the exact date. His left leg and foot go numb at night and he sounds like he has claudication with walking any distance. He attributes it to poor circulation. He reports it starts to hurt when he walks a couple of feet. His right leg hurts as well, but not as bad as the left. Denies chest pain and coughing blood. The cancer was found during a routine screen. A CT scan was scheduled after the initial scan. The pt is still smoking. The pt is currently smoking 25 cigarettes a day and has dropped from 2 packs a day. It is noted that the pt has a hoarse voice. He reports some difficulty swallowing sometimes. Pt states his feet would turn blue and red sometimes. It is noted that the pt had a coughing fit while we entered the room. (intake note from Dr. Lamonte Sakai) 74 year old active smoker (84 pk-yrs) with history of CAD/CABG, abdominal aneurysm repair, hypertension, prostate CA (2001), hyperlipidemia. He underwent a screening CT scan of the chest 06/04/14 ordered by Dr. Arelia Sneddon that showed a dominant right upper lobe mass and a smaller left lower lobe nodule. He was evaluated in the lung cancer screening program and a PET scan has been obtained 06/18/14 > both lesions are hypermetabolic, as was a slightly enlarged right peritracheal node. There was evidence of pharyngeal esophageal activity without a corresponding anatomical  abnormality on CT scan.  biopsy of his right upper lobe mass revealed adenocarcinoma of the lung. He also had a smaller left lower lobe nodule. Cytology on this was not definitive for malignancy. Dr Lamonte Sakai placed fiducial markers in the right upper lobe mass and also triangulating the left lower lobe nodule. He has a mediastinal node that is hypermetabolic on PET scan. he is a poor surgical candidate based on his clinical status.    PREVIOUS RADIATION THERAPY: No  PAST MEDICAL HISTORY:  has a past medical history of Hypertension; Hyperlipidemia; Irregular heart beat; Shortness of breath dyspnea; Peripheral vascular disease; Anxiety; and Cancer.    PAST SURGICAL HISTORY: Past Surgical History  Procedure Laterality Date  . Cholecystectomy    . Heart bypass    . Cardiac catheterization  2001  . Skin cancer excision N/A     Nose  . Abdominal aortic aneurysm repair    . Eye surgery Bilateral     cataract removal  . Colonoscopy w/ polypectomy    . Cardiac catheterization N/A 07/16/2014    Procedure: Right/Left Heart Cath and Coronary Angiography;  Surgeon: Burnell Blanks, MD;  Location: [facility];  Service: Cardiovascular;  Laterality: N/A;  . Coronary artery bypass graft    . Video bronchoscopy with endobronchial navigation N/A 07/29/2014    Procedure: VIDEO BRONCHOSCOPY WITH ENDOBRONCHIAL NAVIGATION;  Surgeon: Collene Gobble, MD;  Location: [facility];  Service: Thoracic;  Laterality: N/A;  . Peripheral vascular catheterization N/A 08/05/2014  Procedure: Abdominal Aortogram w/Lower Extremity;  Surgeon: Wellington Hampshire, MD;  Location: [facility];  Service: Cardiovascular;  Laterality: N/A;    FAMILY HISTORY: family history includes Heart Problems in his mother; Heart attack in his father. There is no history of Stroke.  SOCIAL HISTORY:  reports that he has been smoking Cigarettes.  He has a 112 pack-year smoking history. He does not have any smokeless tobacco history on  file. He reports that he drinks about 1.8 oz of alcohol per week. He reports that he does not use illicit drugs.  ALLERGIES: Review of patient's allergies indicates no known allergies.  MEDICATIONS:  Current Outpatient Prescriptions  Medication Sig Dispense Refill  . aspirin EC 81 MG tablet Take 81 mg by mouth daily.    . furosemide (LASIX) 40 MG tablet Take 40 mg by mouth as needed for edema.   0  . lisinopril (PRINIVIL,ZESTRIL) 20 MG tablet Take 20 mg by mouth daily.    . pravastatin (PRAVACHOL) 40 MG tablet Take 40 mg by mouth daily.    . Tiotropium Bromide Monohydrate (SPIRIVA RESPIMAT) 2.5 MCG/ACT AERS Inhale 2 puffs into the lungs daily. 1 Inhaler 4   No current facility-administered medications for this encounter.    REVIEW OF SYSTEMS:  A 15 point review of systems is documented in the electronic medical record. This was obtained by the nursing staff. However, I reviewed this with the patient to discuss relevant findings and make appropriate changes. Pertinent symptoms reviewed in the history of present illness   PHYSICAL EXAM:  Vitals - 1 value per visit 06/10/4257  SYSTOLIC 563  DIASTOLIC 60  Pulse 84  Temperature 98.2  Respirations 18  Weight (lb) 185.3  Height '5\' 8"'$   BMI 28.18  VISIT REPORT    General: Alert and oriented, in no acute distress HEENT: Head is normocephalic. Extraocular movements are intact. Oropharynx is clear. Neck: Neck is supple, no palpable cervical or supraclavicular lymphadenopathy. Tongue is mid-line Heart: Regular in rate and rhythm with no murmurs, rubs, or gallops. Chest: Clear to auscultation bilaterally, with no rhonchi, wheezes, or rales. A sternal scar is noted; well healed. Abdomen: Soft, nontender, nondistended, with no rigidity or guarding. Extremities: No cyanosis or edema. Lymphatics: see Neck Exam Skin: No concerning lesions. Musculoskeletal: symmetric strength and muscle tone throughout. Neurologic: Cranial nerves II through XII are  grossly intact. No obvious focal deficits. Speech is fluent. Coordination is intact. Psychiatric: Judgment and insight are intact. Affect is appropriate.     ECOG = 1    1 - Symptomatic but completely ambulatory (Restricted in physically strenuous activity but ambulatory and able to carry out work of a light or sedentary nature. For example, light housework, office work)  LABORATORY DATA:  Lab Results  Component Value Date   WBC 4.5 08/13/2014   HGB 8.3* 08/13/2014   HCT 29.0* 08/13/2014   MCV 63.8* 08/13/2014   PLT 299 08/13/2014   NEUTROABS 2.5 08/13/2014   Lab Results  Component Value Date   NA 135* 08/13/2014   K 4.3 08/13/2014   CL 103 07/28/2014   CO2 25 08/13/2014   GLUCOSE 88 08/13/2014   CREATININE 0.9 08/13/2014   CALCIUM 8.8 08/13/2014      RADIOGRAPHY: Dg Chest Port 1 View  07/29/2014   CLINICAL DATA:  Status post bronchoscopy with biopsy  EXAM: PORTABLE CHEST - 1 VIEW  COMPARISON:  Chest CT June 29, 2014  FINDINGS: There is no demonstrable pneumothorax. The mass in the  right apex with surrounding pneumonitis is again noted. The mass in the right apex measures 4.3 x 3.0 cm. Elsewhere, there is mild atelectasis in the right base. The known mass in the left lower lobe is not convincingly seen on radiographic examination. There is no frank edema or consolidation. Heart is mildly enlarged with pulmonary vascularity within normal limits. Patient is status post coronary artery bypass grafting. No adenopathy. There is an old healed fracture of the left posterior seventh rib.  IMPRESSION: Right apical mass with surrounding pneumonitis appears essentially stable. The mass in the left lower lobe noted on recent CT is not appreciable on radiographic examination. No pneumothorax. No new areas of opacity beyond slight atelectasis in the right base. Heart mildly enlarged but stable.   Electronically Signed   By: Lowella Grip III M.D.   On: 07/29/2014 13:40   Dg C-arm  Bronchoscopy  07/29/2014   CLINICAL DATA:    C-ARM BRONCHOSCOPY  Fluoroscopy was utilized by the requesting physician.  No radiographic  interpretation.       IMPRESSION: Stage III vs Stage IV non-small cell lung cancer. The patient's x-rays were reviewed at the thoracic conference this morning. Also discussed management with Dr. Julien Nordmann.  We will Give the patient the  benefit of doubt and will treat him as a stage IIIa non-small cell lung cancer with radiation delivered to the right apical mass and right paratracheal lymphadenopathy.   PLAN: We will observe the cancer in the left lower lobe while the pt is receiving chemotherapy.  This lesion in the left lower lobe appears to be too close too close to the aorta and therefore SBRT could likely not be performed. We plan to give the pt approximately 30 radiation treatments over 6 weeks. The radiation and chemotherapy treatments are on hold pending discussion with Dr. Donnetta Hutching concerning patient's anticipated vascular surgery.  This document serves as a record of services personally performed by Gery Pray, MD. It was created on his behalf by Darcus Austin, a trained medical scribe. The creation of this record is based on the scribe's personal observations and the provider's statements to them. This document has been checked and approved by the attending provider. ------------------------------------------------  Blair Promise, PhD, MD

## 2014-08-13 NOTE — Progress Notes (Signed)
Oncology Nurse Navigator Documentation  Oncology Nurse Navigator Flowsheets 08/13/2014  Referral date to RadOnc/MedOnc   Navigator Encounter Type Clinic/MDC  Patient Visit Type Initial  Treatment Phase Abnormal Scans and pathology  Patient was seen today in [facility] regarding recent dx lung cancer.  Barriers/Navigation Needs Education/Information given and explained regarding lung cancer/side effects of treatment/resources at the cancer center  Coordination of Care Understanding dx and next steps  Support Groups/Services Friends and Family  Time Spent with Patient 30         Thoracic Treatment Summary Name:Steve Baker Date:08/13/2014 DOB:September 10, 1940 Your Medical Team Medical Oncologist:Dr. Julien Nordmann Radiation Oncologist:Dr. Kinard Pulmonologist: Dr. 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  Pathological stage will be determined after surgery.  Staging is based on the size of the tumor, involvement of lymph nodes or not, and whether or not the cancer center has spread. Recommendations Recommendations: Concurrent chemoradiation therapy after leg surgery These recommendations are based on information available as of today's consult.  This is subject to change depending further testing or exams. Next Steps Next Step: Medical Oncology will set up follow up appointments  Dr. Julien Nordmann will clarify with [facility], Dr. Donnetta Hutching to see about surgery before cancer treatment.  He will follow up with navigator and will update patient.  . Barriers to Care What do you perceive as a potential barrier that may prevent you from receiving your treatment plan? Education-information given and explained  Support-information given  Financial-Financial advocates will follow up with hm as needed.   Resources Given: [employer] booklet on NSCLC adenocarcinoma Support/Resources Services at [employer].Radonna Ricker [phone]  Fall Risk Information given    Questions [name], RN BSN Thoracic Oncology Nurse Navigator at [place] is a nurse navigator that is available to assist you through your cancer journey.  She can answer your questions and/or provide resources regarding your treatment plan, emotional support, or financial concerns.

## 2014-08-14 ENCOUNTER — Telehealth: Payer: Self-pay | Admitting: Internal Medicine

## 2014-08-14 ENCOUNTER — Other Ambulatory Visit: Payer: Self-pay

## 2014-08-14 NOTE — Telephone Encounter (Signed)
s.w. pt wife and advised on June appt....ok and aware °

## 2014-08-15 ENCOUNTER — Encounter: Payer: Self-pay | Admitting: Internal Medicine

## 2014-08-15 DIAGNOSIS — D509 Iron deficiency anemia, unspecified: Secondary | ICD-10-CM | POA: Insufficient documentation

## 2014-08-15 MED ORDER — INTEGRA PLUS PO CAPS: 1.0000 | ORAL_CAPSULE | Freq: Every morning | ORAL | Status: DC

## 2014-08-17 ENCOUNTER — Telehealth: Payer: Self-pay | Admitting: *Deleted

## 2014-08-17 NOTE — Pre-Procedure Instructions (Signed)
    Steve Baker  08/17/2014      [facility] - [place], [place] - [address] [facility] [place] [address] Phone: [phone] Fax: [phone]    Your procedure is scheduled on August 19, 2014, 8:30 AM .  Report to [facility] Admitting at 6:30 A.M.  Call this number if you have problems the morning of surgery:  [phone]   Remember:  Do not eat food or drink liquids after midnight.   Take these medicines the morning of surgery with A SIP OF WATER all inhalers and Aspirin.   Stop taking: Ibuprofen, BC's, Goody's, Aleve, Naproxen, Fish Oil and herbal medications.    Do not wear jewelry, make-up or nail polish.  Do not wear lotions, powders, or colognes.  You may wear deodorant.  Do not shave 48 hours prior to surgery.  Men may shave face and neck.  Do not bring valuables to the hospital.  [facility] is not responsible for any belongings or valuables.  Contacts, dentures or bridgework may not be worn into surgery.  Leave your suitcase in the car.  After surgery it may be brought to your room.  For patients admitted to the hospital, discharge time will be determined by your treatment team.  Patients discharged the day of surgery will not be allowed to drive home.    Special instructions: See attached.   Please read over the following fact sheets that you were given. Pain Booklet, Coughing and Deep Breathing, MRSA Information and Surgical Site Infection Prevention

## 2014-08-17 NOTE — Telephone Encounter (Signed)
Oncology Nurse Navigator Documentation  Oncology Nurse Navigator Flowsheets 08/17/2014  Referral date to RadOnc/MedOnc   Navigator Encounter Type Telephone/Phone call follow up   Patient Visit Type Phone call   Treatment Phase Other  Barriers/Navigation Needs Education  Education Newly Diagnosed Cancer Education  Coordination of Care Other/I called and spoke with wife.  We discussed his upcoming appt.  She is aware of all appt.  I asked if she had any questions or concerns at this time and she stated no.    Support Groups/Services Friends and Family  Time Spent with Patient 15

## 2014-08-18 ENCOUNTER — Encounter (HOSPITAL_COMMUNITY): Payer: Self-pay

## 2014-08-18 ENCOUNTER — Encounter (HOSPITAL_COMMUNITY)
Admission: RE | Admit: 2014-08-18 | Discharge: 2014-08-18 | Disposition: A | Discharge status: Home / Self Care | Payer: Medicare Other | Source: Ambulatory Visit | Attending: Vascular Surgery | Admitting: Vascular Surgery

## 2014-08-18 HISTORY — DX: Personal history of other diseases of the respiratory system: Z87.09

## 2014-08-18 HISTORY — DX: Cardiac arrhythmia, unspecified: I49.9

## 2014-08-18 HISTORY — DX: Anemia, unspecified: D64.9

## 2014-08-18 HISTORY — DX: Chronic obstructive pulmonary disease, unspecified: J44.9

## 2014-08-18 HISTORY — DX: Atherosclerotic heart disease of native coronary artery without angina pectoris: I25.10

## 2014-08-18 LAB — COMPREHENSIVE METABOLIC PANEL
ALBUMIN: 3.6 g/dL (ref 3.5–5.0)
ALT: 9 U/L — AB (ref 17–63)
ANION GAP: 8 (ref 5–15)
AST: 29 U/L (ref 15–41)
Alkaline Phosphatase: 82 U/L (ref 38–126)
BUN: 9 mg/dL (ref 6–20)
CALCIUM: 8.6 mg/dL — AB (ref 8.9–10.3)
CO2: 23 mmol/L (ref 22–32)
Chloride: 100 mmol/L — ABNORMAL LOW (ref 101–111)
Creatinine, Ser: 0.91 mg/dL (ref 0.61–1.24)
GFR calc non Af Amer: >60 mL/min (ref >60)
Glucose, Bld: 125 mg/dL — ABNORMAL HIGH (ref 65–99)
Potassium: 4.4 mmol/L (ref 3.5–5.1)
SODIUM: 131 mmol/L — AB (ref 135–145)
TOTAL PROTEIN: 6.8 g/dL (ref 6.5–8.1)
Total Bilirubin: 0.4 mg/dL (ref 0.3–1.2)

## 2014-08-18 LAB — CBC
HEMATOCRIT: 31.8 % — AB (ref 39.0–52.0)
HEMOGLOBIN: 8.9 g/dL — AB (ref 13.0–17.0)
MCH: 18.4 pg — ABNORMAL LOW (ref 26.0–34.0)
MCHC: 28 g/dL — ABNORMAL LOW (ref 30.0–36.0)
MCV: 65.8 fL — ABNORMAL LOW (ref 78.0–100.0)
Platelets: 326 10*3/uL (ref 150–400)
RBC: 4.83 MIL/uL (ref 4.22–5.81)
RDW: 19.6 % — ABNORMAL HIGH (ref 11.5–15.5)
WBC: 4.6 10*3/uL (ref 4.0–10.5)

## 2014-08-18 LAB — URINALYSIS, ROUTINE W REFLEX MICROSCOPIC
BILIRUBIN URINE: NEGATIVE
Glucose, UA: NEGATIVE mg/dL
Hgb urine dipstick: NEGATIVE
KETONES UR: NEGATIVE mg/dL
LEUKOCYTES UA: NEGATIVE
NITRITE: NEGATIVE
PH: 5 (ref 5.0–8.0)
PROTEIN: NEGATIVE mg/dL
Specific Gravity, Urine: 1.006 (ref 1.005–1.030)
UROBILINOGEN UA: 0.2 mg/dL (ref 0.0–1.0)

## 2014-08-18 LAB — PREPARE RBC (CROSSMATCH)

## 2014-08-18 LAB — APTT: aPTT: 34 seconds (ref 24–37)

## 2014-08-18 LAB — PROTIME-INR
INR: 1.09 (ref 0.00–1.49)
PROTHROMBIN TIME: 14.3 s (ref 11.6–15.2)

## 2014-08-18 LAB — SURGICAL PCR SCREEN
MRSA, PCR: NEGATIVE
STAPHYLOCOCCUS AUREUS: NEGATIVE

## 2014-08-18 MED ORDER — CEFUROXIME SODIUM 1.5 G IJ SOLR
1.5000 g | INTRAMUSCULAR | Status: AC
  Administered 2014-08-19: 1.5 g via INTRAVENOUS
  Filled 2014-08-18: qty 1.5

## 2014-08-18 MED ORDER — CHLORHEXIDINE GLUCONATE 4 % EX LIQD: 60.0000 mL | Freq: Once | CUTANEOUS | Status: DC

## 2014-08-18 NOTE — Progress Notes (Signed)
PCP- Dr. Arelia Sneddon   Cardiac Cath- May 2016  Echo- May 2016  Stress Test- denies

## 2014-08-18 NOTE — Progress Notes (Signed)
Anesthesia Chart Review: Patient is a 74 year old male scheduled for left profunda femoris endarterectomy and left FPBG with Gortex on 08/19/14 by Dr. Donnetta Hutching. Patient was referred to Dr. Donnetta Hutching by cardiologist Dr. Fletcher Anon for consideration of left FPBG.  PMH includes: CAD (s/p CABG '01), HTN, PVD, AAA repair, hyperlipidemia, prostate cancer s/p radioactive seed implant '01, smoking, lung cancer stage IV (T2a, N2, M1a) non-small cell lung cancer adenocarcinoma diagnosed 07/2014 versus 2 synchronous primary tumor presenting with a stage IIIA (T2a, N2, M0) on the right and a stage IA (T1a, N0, M0) on the left. BMI 28.5. PCP is Dr. Claris Gower. Pulmonologist is Dr. Lamonte Sakai. Hem-Onc is Dr. Julien Nordmann.  He is delaying plans for chemotherapy until after patient has recovered from his vascular surgery. He is seen Dr. Sondra Come for consideration of radiation therapy.    Meds include Integra Plus (not started yet), ASA, lisinopril, pravastatin, Spiriva.  Preoperative labs reviewed. H/H 8.9/31.8, up from 8.3/29.0 on 08/13/14 (range since 07/03/14 8.3-9.3/29-33.1). PT/PTT WNL. Cr 0.91. T&S done. T&C for 2 Units ordered to have available as needed. H/H called to [facility] RN Colletta [place].  EKG 07/10/2014: NSR. Possible anterior infarct, age undetermined.   Cardiac cath 07/16/2014 done for abnormal stress test (inferolateral scar with peri-infarct ischemia primarily of the mid anterolateral region, EF 24%):   Prox RCA lesion, 100% stenosed.  Dist Cx lesion, 100% stenosed.  Prox Cx lesion, 50% stenosed.  Prox Cx to Mid Cx lesion, 95% stenosed.  1st Diag lesion, 80% stenosed.  Ost 2nd Diag lesion, 50% stenosed.  Mid LAD to Dist LAD lesion, 100% stenosed.  Origin lesion, 100% stenosed.  Prox Graft lesion, 100% stenosed.  Origin lesion, 100% stenosed. 1. Severe triple vessel CAD s/p 4V CABG with 2/4 patent bypass grafts 2. Occlusion of mid LAD. The mid and distal LAD fills from the patent IMA graft and also supplies a moderate  caliber diagonal branch and gives off left to right collaterals to the PDA.  3. Occluded RCA with distal filling from the patent graft and the left to right collaterals.  4. Severe stenosis in the mid Circumflex artery leading into the small caliber distal OM branches. The graft to this vessel is occluded 5. Severe LV systolic dysfunction Recommendations: I would continue medical management of his CAD. The stenosis in his small caliber distal Circumflex and OM system is severe but he is having no angina and his LV dysfunction is out of proportion to this small potentially ischemic territory. I would proceed with planning for his lung tumor diagnosis. I would not recommend stenting of the small distal Circumflex at this time. (Dr. Angelena Form)  Echo 07/06/2014: - Left ventricle: The cavity size was normal. Wall thickness was increased in a pattern of mild LVH. Systolic function was mildly to moderately reduced. The estimated ejection fraction was in the range of 40% to 45%. There is akinesis of the inferolateral myocardium. There is akinesis of the basalinferior myocardium. Doppler parameters are consistent with restrictive physiology, indicative of decreased left ventricular diastolic compliance and/or increased left atrial pressure. Doppler parameters are consistent with high ventricular filling pressure. - Aortic valve: There was trivial regurgitation. - Mitral valve: There was mild regurgitation. - Left atrium: The atrium was moderately dilated. - Right atrium: The atrium was moderately dilated. - Pulmonary arteries: Systolic pressure was moderately increased. PA peak pressure: 54 mm Hg (S). Impressions:- Mild to moderate reduction in LV function; akinesis of the basal/mild inferior and inferior lateral wall; restrictive LV filling; biatrial  enlargement; mild MR; trace AI; moderately increased pulmonary pressure.  08/12/14 PFTs: FVC 3.10 (78%), FEV1 2.18 (76%), DLCO 10.20 (35%). No significant response  to BD. Minimal obstructive airway disease. Severe diffusion defect. Very flat inspiratory portion with F/V loop variable extrathoracic airway obstruction. Recommend clinical correlation.  If no changes, I anticipate pt can proceed with surgery as scheduled.   1V CXR (post-bronch): IMPRESSION: Right apical mass with surrounding pneumonitis appears essentially stable. The mass in the left lower lobe noted on recent CT is not appreciable on radiographic examination. No pneumothorax. No new areas of opacity beyond slight atelectasis in the right base. Heart mildly enlarged but stable.  Patient with recent cardiac cath with continued medical therapy recommended.  Cardiologist Dr. Fletcher Anon performed recent PV a-gram and referred patient for this procedure.  If no acute changes I would anticipate he could proceed; however, he is anemic with underlying CAD.  He made need pre-operative/peri-operative transfusion but would defer to Dr. Donnetta Hutching and/or his anesthesiologist. Anesthesiologist Dr. Linna Caprice agrees with this plan.    [name], [name] [facility] Short Stay Center/Anesthesiology Phone [phone] 08/18/2014 2:48 PM

## 2014-08-18 NOTE — Progress Notes (Addendum)
   08/18/14 0940  OBSTRUCTIVE SLEEP APNEA  Have you ever been diagnosed with sleep apnea through a sleep study? No  Do you snore loudly (loud enough to be heard through closed doors)?  0  Do you often feel tired, fatigued, or sleepy during the daytime? 1  Has anyone observed you stop breathing during your sleep? 0  Do you have, or are you being treated for high blood pressure? 1  Age over 75 years old? 1  Neck circumference greater than 40 cm/16 inches? 1  Gender: 1   Total Score: 5  This patient has screened at risk for sleep apnea using the STOP BANG tool during a pre-surgical visit. A score of 4 or greater is at risk for sleep apnea.

## 2014-08-19 ENCOUNTER — Encounter (HOSPITAL_COMMUNITY): Payer: Self-pay | Admitting: Certified Registered Nurse Anesthetist

## 2014-08-19 ENCOUNTER — Encounter (HOSPITAL_COMMUNITY)
Admission: RE | Disposition: A | Discharge status: Home / Self Care | Payer: Self-pay | Source: Ambulatory Visit | Attending: Vascular Surgery

## 2014-08-19 ENCOUNTER — Inpatient Hospital Stay (HOSPITAL_COMMUNITY): Payer: Medicare Other | Admitting: Certified Registered Nurse Anesthetist

## 2014-08-19 ENCOUNTER — Inpatient Hospital Stay (HOSPITAL_COMMUNITY)
Admission: RE | Admit: 2014-08-19 | Discharge: 2014-08-21 | DRG: 253 | Disposition: A | Discharge status: Home / Self Care | Payer: Medicare Other | Source: Ambulatory Visit | Attending: Vascular Surgery | Admitting: Vascular Surgery

## 2014-08-19 ENCOUNTER — Inpatient Hospital Stay (HOSPITAL_COMMUNITY): Payer: Medicare Other | Admitting: Vascular Surgery

## 2014-08-19 DIAGNOSIS — Z01812 Encounter for preprocedural laboratory examination: Secondary | ICD-10-CM | POA: Diagnosis not present

## 2014-08-19 DIAGNOSIS — Z8249 Family history of ischemic heart disease and other diseases of the circulatory system: Secondary | ICD-10-CM

## 2014-08-19 DIAGNOSIS — E785 Hyperlipidemia, unspecified: Secondary | ICD-10-CM | POA: Diagnosis present

## 2014-08-19 DIAGNOSIS — J449 Chronic obstructive pulmonary disease, unspecified: Secondary | ICD-10-CM | POA: Diagnosis present

## 2014-08-19 DIAGNOSIS — I7092 Chronic total occlusion of artery of the extremities: Secondary | ICD-10-CM | POA: Diagnosis present

## 2014-08-19 DIAGNOSIS — I252 Old myocardial infarction: Secondary | ICD-10-CM

## 2014-08-19 DIAGNOSIS — Z9889 Other specified postprocedural states: Secondary | ICD-10-CM | POA: Diagnosis not present

## 2014-08-19 DIAGNOSIS — Z951 Presence of aortocoronary bypass graft: Secondary | ICD-10-CM

## 2014-08-19 DIAGNOSIS — F17210 Nicotine dependence, cigarettes, uncomplicated: Secondary | ICD-10-CM | POA: Diagnosis present

## 2014-08-19 DIAGNOSIS — Z7982 Long term (current) use of aspirin: Secondary | ICD-10-CM | POA: Diagnosis not present

## 2014-08-19 DIAGNOSIS — C3490 Malignant neoplasm of unspecified part of unspecified bronchus or lung: Secondary | ICD-10-CM | POA: Diagnosis present

## 2014-08-19 DIAGNOSIS — I70222 Atherosclerosis of native arteries of extremities with rest pain, left leg: Principal | ICD-10-CM | POA: Diagnosis present

## 2014-08-19 DIAGNOSIS — I739 Peripheral vascular disease, unspecified: Secondary | ICD-10-CM | POA: Diagnosis present

## 2014-08-19 DIAGNOSIS — I2510 Atherosclerotic heart disease of native coronary artery without angina pectoris: Secondary | ICD-10-CM | POA: Diagnosis present

## 2014-08-19 DIAGNOSIS — Z8546 Personal history of malignant neoplasm of prostate: Secondary | ICD-10-CM

## 2014-08-19 DIAGNOSIS — Z923 Personal history of irradiation: Secondary | ICD-10-CM

## 2014-08-19 DIAGNOSIS — I748 Embolism and thrombosis of other arteries: Secondary | ICD-10-CM | POA: Diagnosis present

## 2014-08-19 DIAGNOSIS — D638 Anemia in other chronic diseases classified elsewhere: Secondary | ICD-10-CM | POA: Diagnosis present

## 2014-08-19 DIAGNOSIS — I10 Essential (primary) hypertension: Secondary | ICD-10-CM | POA: Diagnosis present

## 2014-08-19 HISTORY — PX: ENDARTERECTOMY FEMORAL: SHX5804

## 2014-08-19 HISTORY — PX: FEMORAL-POPLITEAL BYPASS GRAFT: SHX937

## 2014-08-19 HISTORY — PX: PATCH ANGIOPLASTY: SHX6230

## 2014-08-19 LAB — CBC
HCT: 26.7 % — ABNORMAL LOW (ref 39.0–52.0)
Hemoglobin: 7.5 g/dL — ABNORMAL LOW (ref 13.0–17.0)
MCH: 18.8 pg — AB (ref 26.0–34.0)
MCHC: 28.1 g/dL — ABNORMAL LOW (ref 30.0–36.0)
MCV: 66.8 fL — AB (ref 78.0–100.0)
Platelets: 261 10*3/uL (ref 150–400)
RBC: 4 MIL/uL — AB (ref 4.22–5.81)
RDW: 20.3 % — AB (ref 11.5–15.5)
WBC: 4.6 10*3/uL (ref 4.0–10.5)

## 2014-08-19 LAB — CREATININE, SERUM
CREATININE: 0.91 mg/dL (ref 0.61–1.24)
GFR calc Af Amer: >60 mL/min (ref >60)
GFR calc non Af Amer: >60 mL/min (ref >60)

## 2014-08-19 SURGERY — ENDARTERECTOMY, FEMORAL
Anesthesia: General | Site: Leg Upper | Laterality: Left

## 2014-08-19 MED ORDER — PHENOL 1.4 % MT LIQD: 1.0000 | OROMUCOSAL | Status: DC | PRN

## 2014-08-19 MED ORDER — SODIUM CHLORIDE 0.9 % IV SOLN
INTRAVENOUS | Status: DC
  Administered 2014-08-19 (×2): via INTRAVENOUS

## 2014-08-19 MED ORDER — ROCURONIUM BROMIDE 100 MG/10ML IV SOLN
INTRAVENOUS | Status: DC | PRN
  Administered 2014-08-19: 40 mg via INTRAVENOUS

## 2014-08-19 MED ORDER — DEXTROSE 5 % IV SOLN
10.0000 mg | INTRAVENOUS | Status: DC | PRN
  Administered 2014-08-19: 20 ug/min via INTRAVENOUS
  Administered 2014-08-19: 11:00:00 via INTRAVENOUS

## 2014-08-19 MED ORDER — STERILE WATER FOR INJECTION IJ SOLN
INTRAMUSCULAR | Status: AC
  Filled 2014-08-19: qty 10

## 2014-08-19 MED ORDER — SODIUM CHLORIDE 0.9 % IV SOLN: 500.0000 mL | Freq: Once | INTRAVENOUS | Status: AC | PRN

## 2014-08-19 MED ORDER — BISACODYL 10 MG RE SUPP
10.0000 mg | Freq: Every day | RECTAL | Status: DC | PRN
Start: 2014-08-19 — End: 2014-08-21

## 2014-08-19 MED ORDER — DOCUSATE SODIUM 100 MG PO CAPS
100.0000 mg | ORAL_CAPSULE | Freq: Every day | ORAL | Status: DC
Start: 2014-08-20 — End: 2014-08-21
  Administered 2014-08-20: 100 mg via ORAL
  Filled 2014-08-19 (×2): qty 1

## 2014-08-19 MED ORDER — PROTAMINE SULFATE 10 MG/ML IV SOLN
INTRAVENOUS | Status: DC | PRN
  Administered 2014-08-19 (×5): 10 mg via INTRAVENOUS

## 2014-08-19 MED ORDER — ACETAMINOPHEN 650 MG RE SUPP: 325.0000 mg | RECTAL | Status: DC | PRN

## 2014-08-19 MED ORDER — NEOSTIGMINE METHYLSULFATE 10 MG/10ML IV SOLN
INTRAVENOUS | Status: DC | PRN
  Administered 2014-08-19: 3 mg via INTRAVENOUS

## 2014-08-19 MED ORDER — FUROSEMIDE 40 MG PO TABS
40.0000 mg | ORAL_TABLET | Freq: Every day | ORAL | Status: DC | PRN
  Filled 2014-08-19: qty 1

## 2014-08-19 MED ORDER — PANTOPRAZOLE SODIUM 40 MG PO TBEC
40.0000 mg | DELAYED_RELEASE_TABLET | Freq: Every day | ORAL | Status: DC
  Administered 2014-08-19 – 2014-08-20 (×2): 40 mg via ORAL
  Filled 2014-08-19 (×2): qty 1

## 2014-08-19 MED ORDER — FENTANYL CITRATE (PF) 100 MCG/2ML IJ SOLN
INTRAMUSCULAR | Status: DC | PRN
  Administered 2014-08-19 (×2): 50 ug via INTRAVENOUS
  Administered 2014-08-19: 100 ug via INTRAVENOUS
  Administered 2014-08-19: 50 ug via INTRAVENOUS

## 2014-08-19 MED ORDER — MORPHINE SULFATE 2 MG/ML IJ SOLN: 2.0000 mg | INTRAMUSCULAR | Status: DC | PRN

## 2014-08-19 MED ORDER — GLYCOPYRROLATE 0.2 MG/ML IJ SOLN
INTRAMUSCULAR | Status: AC
  Filled 2014-08-19: qty 2

## 2014-08-19 MED ORDER — GLYCOPYRROLATE 0.2 MG/ML IJ SOLN
INTRAMUSCULAR | Status: DC | PRN
Start: 2014-08-19 — End: 2014-08-19
  Administered 2014-08-19: 0.4 mg via INTRAVENOUS

## 2014-08-19 MED ORDER — HYDROMORPHONE HCL 1 MG/ML IJ SOLN
INTRAMUSCULAR | Status: AC
  Administered 2014-08-19: 0.5 mg via INTRAVENOUS
  Filled 2014-08-19: qty 2

## 2014-08-19 MED ORDER — 0.9 % SODIUM CHLORIDE (POUR BTL) OPTIME
TOPICAL | Status: DC | PRN
  Administered 2014-08-19: 1000 mL

## 2014-08-19 MED ORDER — ALUM & MAG HYDROXIDE-SIMETH 200-200-20 MG/5ML PO SUSP: 15.0000 mL | ORAL | Status: DC | PRN

## 2014-08-19 MED ORDER — PRAVASTATIN SODIUM 40 MG PO TABS
40.0000 mg | ORAL_TABLET | Freq: Every day | ORAL | Status: DC
  Administered 2014-08-19 – 2014-08-20 (×2): 40 mg via ORAL
  Filled 2014-08-19 (×4): qty 1

## 2014-08-19 MED ORDER — LIDOCAINE HCL (CARDIAC) 20 MG/ML IV SOLN
INTRAVENOUS | Status: AC
  Filled 2014-08-19: qty 5

## 2014-08-19 MED ORDER — METOPROLOL TARTRATE 1 MG/ML IV SOLN: 2.0000 mg | INTRAVENOUS | Status: DC | PRN

## 2014-08-19 MED ORDER — FENTANYL CITRATE (PF) 250 MCG/5ML IJ SOLN
INTRAMUSCULAR | Status: AC
  Filled 2014-08-19: qty 5

## 2014-08-19 MED ORDER — DEXTROSE 5 % IV SOLN
1.5000 g | Freq: Two times a day (BID) | INTRAVENOUS | Status: AC
  Administered 2014-08-19 – 2014-08-20 (×2): 1.5 g via INTRAVENOUS
  Filled 2014-08-19 (×2): qty 1.5

## 2014-08-19 MED ORDER — ENOXAPARIN SODIUM 30 MG/0.3ML ~~LOC~~ SOLN
30.0000 mg | SUBCUTANEOUS | Status: DC
  Administered 2014-08-20: 30 mg via SUBCUTANEOUS
  Filled 2014-08-19 (×3): qty 0.3

## 2014-08-19 MED ORDER — ONDANSETRON HCL 4 MG/2ML IJ SOLN: 4.0000 mg | Freq: Four times a day (QID) | INTRAMUSCULAR | Status: DC | PRN

## 2014-08-19 MED ORDER — LIDOCAINE HCL (CARDIAC) 20 MG/ML IV SOLN
INTRAVENOUS | Status: DC | PRN
  Administered 2014-08-19: 20 mg via INTRAVENOUS
  Administered 2014-08-19: 80 mg via INTRAVENOUS

## 2014-08-19 MED ORDER — ALBUMIN HUMAN 5 % IV SOLN
INTRAVENOUS | Status: DC | PRN
  Administered 2014-08-19: 10:00:00 via INTRAVENOUS

## 2014-08-19 MED ORDER — ONDANSETRON HCL 4 MG/2ML IJ SOLN
INTRAMUSCULAR | Status: DC | PRN
  Administered 2014-08-19: 4 mg via INTRAVENOUS

## 2014-08-19 MED ORDER — HYDROMORPHONE HCL 1 MG/ML IJ SOLN
0.2500 mg | INTRAMUSCULAR | Status: DC | PRN
  Administered 2014-08-19 (×3): 0.5 mg via INTRAVENOUS

## 2014-08-19 MED ORDER — ONDANSETRON HCL 4 MG/2ML IJ SOLN
INTRAMUSCULAR | Status: AC
  Filled 2014-08-19: qty 2

## 2014-08-19 MED ORDER — PROTAMINE SULFATE 10 MG/ML IV SOLN
INTRAVENOUS | Status: AC
  Filled 2014-08-19: qty 5

## 2014-08-19 MED ORDER — NEOSTIGMINE METHYLSULFATE 10 MG/10ML IV SOLN
INTRAVENOUS | Status: AC
  Filled 2014-08-19: qty 4

## 2014-08-19 MED ORDER — MAGNESIUM SULFATE 2 GM/50ML IV SOLN
2.0000 g | Freq: Every day | INTRAVENOUS | Status: DC | PRN
  Filled 2014-08-19: qty 50

## 2014-08-19 MED ORDER — MEPERIDINE HCL 25 MG/ML IJ SOLN: 6.2500 mg | INTRAMUSCULAR | Status: DC | PRN

## 2014-08-19 MED ORDER — EPHEDRINE SULFATE 50 MG/ML IJ SOLN
INTRAMUSCULAR | Status: AC
  Filled 2014-08-19: qty 1

## 2014-08-19 MED ORDER — HEPARIN SODIUM (PORCINE) 1000 UNIT/ML IJ SOLN
INTRAMUSCULAR | Status: AC
  Filled 2014-08-19: qty 1

## 2014-08-19 MED ORDER — PROPOFOL 10 MG/ML IV BOLUS
INTRAVENOUS | Status: AC
  Filled 2014-08-19: qty 20

## 2014-08-19 MED ORDER — LABETALOL HCL 5 MG/ML IV SOLN
10.0000 mg | INTRAVENOUS | Status: DC | PRN
  Filled 2014-08-19: qty 4

## 2014-08-19 MED ORDER — HYDRALAZINE HCL 20 MG/ML IJ SOLN: 5.0000 mg | INTRAMUSCULAR | Status: DC | PRN

## 2014-08-19 MED ORDER — HEPARIN SODIUM (PORCINE) 1000 UNIT/ML IJ SOLN
INTRAMUSCULAR | Status: DC | PRN
  Administered 2014-08-19: 8000 [IU] via INTRAVENOUS

## 2014-08-19 MED ORDER — SENNOSIDES-DOCUSATE SODIUM 8.6-50 MG PO TABS
1.0000 | ORAL_TABLET | Freq: Every evening | ORAL | Status: DC | PRN
  Filled 2014-08-19: qty 1

## 2014-08-19 MED ORDER — MIDAZOLAM HCL 5 MG/5ML IJ SOLN
INTRAMUSCULAR | Status: DC | PRN
  Administered 2014-08-19: 2 mg via INTRAVENOUS

## 2014-08-19 MED ORDER — PROMETHAZINE HCL 25 MG/ML IJ SOLN: 6.2500 mg | INTRAMUSCULAR | Status: DC | PRN

## 2014-08-19 MED ORDER — ASPIRIN EC 81 MG PO TBEC
81.0000 mg | DELAYED_RELEASE_TABLET | Freq: Every day | ORAL | Status: DC
  Administered 2014-08-20 – 2014-08-21 (×2): 81 mg via ORAL
  Filled 2014-08-19 (×2): qty 1

## 2014-08-19 MED ORDER — CETYLPYRIDINIUM CHLORIDE 0.05 % MT LIQD
7.0000 mL | Freq: Two times a day (BID) | OROMUCOSAL | Status: DC
  Administered 2014-08-19 – 2014-08-20 (×2): 7 mL via OROMUCOSAL

## 2014-08-19 MED ORDER — OXYCODONE-ACETAMINOPHEN 5-325 MG PO TABS
1.0000 | ORAL_TABLET | ORAL | Status: DC | PRN
  Administered 2014-08-19 – 2014-08-20 (×2): 1 via ORAL
  Filled 2014-08-19 (×2): qty 1

## 2014-08-19 MED ORDER — SODIUM CHLORIDE 0.9 % IR SOLN
Status: DC | PRN
  Administered 2014-08-19: 500 mL

## 2014-08-19 MED ORDER — GUAIFENESIN-DM 100-10 MG/5ML PO SYRP
15.0000 mL | ORAL_SOLUTION | ORAL | Status: DC | PRN
  Administered 2014-08-20 (×2): 15 mL via ORAL
  Filled 2014-08-19 (×2): qty 15

## 2014-08-19 MED ORDER — MIDAZOLAM HCL 2 MG/2ML IJ SOLN
INTRAMUSCULAR | Status: AC
  Filled 2014-08-19: qty 2

## 2014-08-19 MED ORDER — LACTATED RINGERS IV SOLN
INTRAVENOUS | Status: DC | PRN
  Administered 2014-08-19 (×3): via INTRAVENOUS

## 2014-08-19 MED ORDER — ACETAMINOPHEN 325 MG PO TABS: 325.0000 mg | ORAL_TABLET | ORAL | Status: DC | PRN

## 2014-08-19 MED ORDER — SUCCINYLCHOLINE CHLORIDE 20 MG/ML IJ SOLN
INTRAMUSCULAR | Status: AC
  Filled 2014-08-19: qty 1

## 2014-08-19 MED ORDER — PROPOFOL 10 MG/ML IV BOLUS
INTRAVENOUS | Status: DC | PRN
  Administered 2014-08-19: 150 mg via INTRAVENOUS

## 2014-08-19 MED ORDER — LISINOPRIL 20 MG PO TABS
20.0000 mg | ORAL_TABLET | Freq: Every day | ORAL | Status: DC
  Administered 2014-08-19 – 2014-08-21 (×3): 20 mg via ORAL
  Filled 2014-08-19 (×4): qty 1

## 2014-08-19 MED ORDER — TIOTROPIUM BROMIDE MONOHYDRATE 18 MCG IN CAPS
1.0000 | ORAL_CAPSULE | Freq: Every day | RESPIRATORY_TRACT | Status: DC
  Administered 2014-08-20 – 2014-08-21 (×2): 18 ug via RESPIRATORY_TRACT
  Filled 2014-08-19 (×2): qty 5

## 2014-08-19 MED ORDER — SUCCINYLCHOLINE CHLORIDE 20 MG/ML IJ SOLN
INTRAMUSCULAR | Status: DC | PRN
  Administered 2014-08-19: 100 mg via INTRAVENOUS

## 2014-08-19 MED ORDER — SODIUM CHLORIDE 0.9 % IV SOLN: INTRAVENOUS | Status: DC

## 2014-08-19 MED ORDER — ROCURONIUM BROMIDE 50 MG/5ML IV SOLN
INTRAVENOUS | Status: AC
  Filled 2014-08-19: qty 1

## 2014-08-19 MED ORDER — POTASSIUM CHLORIDE CRYS ER 20 MEQ PO TBCR: 20.0000 meq | EXTENDED_RELEASE_TABLET | Freq: Every day | ORAL | Status: DC | PRN

## 2014-08-19 SURGICAL SUPPLY — 62 items
APL SKNCLS STERI-STRIP NONHPOA (GAUZE/BANDAGES/DRESSINGS) ×4
BANDAGE ESMARK 6X9 LF (GAUZE/BANDAGES/DRESSINGS) ×2 IMPLANT
BENZOIN TINCTURE PRP APPL 2/3 (GAUZE/BANDAGES/DRESSINGS) ×8 IMPLANT
BNDG CMPR 9X4 STRL LF SNTH (GAUZE/BANDAGES/DRESSINGS)
BNDG CMPR 9X6 STRL LF SNTH (GAUZE/BANDAGES/DRESSINGS) ×2
BNDG ESMARK 4X9 LF (GAUZE/BANDAGES/DRESSINGS) IMPLANT
BNDG ESMARK 6X9 LF (GAUZE/BANDAGES/DRESSINGS) ×4
CANISTER SUCTION 2500CC (MISCELLANEOUS) ×4 IMPLANT
CANNULA VESSEL 3MM 2 BLNT TIP (CANNULA) ×16 IMPLANT
CLIP LIGATING EXTRA MED SLVR (CLIP) ×4 IMPLANT
CLIP LIGATING EXTRA SM BLUE (MISCELLANEOUS) ×4 IMPLANT
CLOSURE STERI-STRIP 1/2X4 (GAUZE/BANDAGES/DRESSINGS) ×2
CLOSURE WOUND 1/2 X4 (GAUZE/BANDAGES/DRESSINGS) ×1
CLSR STERI-STRIP ANTIMIC 1/2X4 (GAUZE/BANDAGES/DRESSINGS) ×6 IMPLANT
CUFF TOURNIQUET SINGLE 18IN (TOURNIQUET CUFF) IMPLANT
CUFF TOURNIQUET SINGLE 24IN (TOURNIQUET CUFF) ×4 IMPLANT
CUFF TOURNIQUET SINGLE 34IN LL (TOURNIQUET CUFF) IMPLANT
CUFF TOURNIQUET SINGLE 44IN (TOURNIQUET CUFF) IMPLANT
DRAIN SNY 10X20 3/4 PERF (WOUND CARE) IMPLANT
DRAPE X-RAY CASS 24X20 (DRAPES) IMPLANT
DRSG COVADERM 4X10 (GAUZE/BANDAGES/DRESSINGS) IMPLANT
DRSG COVADERM 4X6 (GAUZE/BANDAGES/DRESSINGS) ×4 IMPLANT
DRSG COVADERM 4X8 (GAUZE/BANDAGES/DRESSINGS) ×4 IMPLANT
ELECT REM PT RETURN 9FT ADLT (ELECTROSURGICAL) ×4
ELECTRODE REM PT RTRN 9FT ADLT (ELECTROSURGICAL) ×2 IMPLANT
EVACUATOR SILICONE 100CC (DRAIN) IMPLANT
GAUZE SPONGE 4X4 12PLY STRL (GAUZE/BANDAGES/DRESSINGS) ×4 IMPLANT
GLOVE BIOGEL PI IND STRL 6.5 (GLOVE) ×2 IMPLANT
GLOVE BIOGEL PI IND STRL 7.5 (GLOVE) ×2 IMPLANT
GLOVE BIOGEL PI INDICATOR 6.5 (GLOVE) ×2
GLOVE BIOGEL PI INDICATOR 7.5 (GLOVE) ×2
GLOVE ECLIPSE 6.5 STRL STRAW (GLOVE) ×8 IMPLANT
GLOVE ECLIPSE 7.0 STRL STRAW (GLOVE) ×4 IMPLANT
GLOVE SS BIOGEL STRL SZ 7.5 (GLOVE) ×2 IMPLANT
GLOVE SUPERSENSE BIOGEL SZ 7.5 (GLOVE) ×2
GOWN STRL REUS W/ TWL LRG LVL3 (GOWN DISPOSABLE) ×6 IMPLANT
GOWN STRL REUS W/TWL LRG LVL3 (GOWN DISPOSABLE) ×12
GRAFT PROPATEN W/RING 6X80X60 (Vascular Products) ×4 IMPLANT
INSERT FOGARTY SM (MISCELLANEOUS) IMPLANT
KIT BASIN OR (CUSTOM PROCEDURE TRAY) ×4 IMPLANT
KIT ROOM TURNOVER OR (KITS) ×4 IMPLANT
LOOP VESSEL MINI RED (MISCELLANEOUS) ×4 IMPLANT
NS IRRIG 1000ML POUR BTL (IV SOLUTION) ×8 IMPLANT
PACK PERIPHERAL VASCULAR (CUSTOM PROCEDURE TRAY) ×4 IMPLANT
PAD ARMBOARD 7.5X6 YLW CONV (MISCELLANEOUS) ×8 IMPLANT
PADDING CAST COTTON 6X4 STRL (CAST SUPPLIES) ×4 IMPLANT
PATCH HEMASHIELD 8X75 (Vascular Products) ×4 IMPLANT
SET COLLECT BLD 21X3/4 12 (NEEDLE) IMPLANT
STAPLER VISISTAT 35W (STAPLE) IMPLANT
STOPCOCK 4 WAY LG BORE MALE ST (IV SETS) IMPLANT
STRIP CLOSURE SKIN 1/2X4 (GAUZE/BANDAGES/DRESSINGS) ×3 IMPLANT
SUT ETHILON 3 0 PS 1 (SUTURE) IMPLANT
SUT PROLENE 5 0 C 1 24 (SUTURE) ×4 IMPLANT
SUT PROLENE 6 0 CC (SUTURE) ×16 IMPLANT
SUT SILK 2 0 SH (SUTURE) ×4 IMPLANT
SUT VIC AB 2-0 CTX 36 (SUTURE) ×8 IMPLANT
SUT VIC AB 3-0 SH 27 (SUTURE) ×12
SUT VIC AB 3-0 SH 27X BRD (SUTURE) ×8 IMPLANT
TRAY FOLEY W/METER SILVER 16FR (SET/KITS/TRAYS/PACK) ×4 IMPLANT
TUBING EXTENTION W/L.L. (IV SETS) IMPLANT
UNDERPAD 30X30 INCONTINENT (UNDERPADS AND DIAPERS) ×4 IMPLANT
WATER STERILE IRR 1000ML POUR (IV SOLUTION) ×4 IMPLANT

## 2014-08-19 NOTE — Progress Notes (Signed)
UR COMPLETED  

## 2014-08-19 NOTE — H&P (View-Only) (Signed)
Hospital Consult  Reason for consult: Lower extremity claudication Consulting physician: Dr. Fletcher Anon  History of Present Illness  Steve Baker is a 74 y.o. (02/12/1941) male with a complex medical history who presents with chief complaint: bilateral lower extremity claudication, left much worse than right. He underwent abdominal aortogram with bilateral runoff with Dr. Fletcher Anon today. He previously had an aortobifemoral bypass by Dr. Donnetta Hutching in 2001. He is able to ambulate for 30 ft before having severe claudication in his left calf. This has been going on for several years and has worsened in the past two years. He denies any history of non-healing wounds. The patient reports some rare numbness in his left foot at night but no obvious rest pain. Atherosclerotic risk factors include: CAD, hypertension, hyperlipidemia, current smoker.  He was recently diagnosed with pulmonary adenocarcinoma. He will see a thoracic oncologist next Thursday 08/13/14. He has a history of CAD s/p CABG in 2001. He recently underwent cardiac catheterization on  07/16/14 which revealed left ventricle dysfunction and 2 out of 4 patent bypass grafts.   On ROS, he admits to SOB with exertion and sometimes at rest. He denies any chest pain or chest pressure. Full ROS below.   Past Medical History  Diagnosis Date  . Hypertension   . Hyperlipidemia   . Irregular heart beat     Pt stated his heart skips a beat every now and then  . Shortness of breath dyspnea     with ambulation  . Peripheral vascular disease   . Anxiety   . Cancer     prostate; seed insertion   2001    Past Surgical History  Procedure Laterality Date  . Cholecystectomy    . Heart bypass    . Cardiac catheterization  2001  . Skin cancer excision N/A     Nose  . Abdominal aortic aneurysm repair    . Eye surgery Bilateral     cataract removal  . Colonoscopy w/ polypectomy    . Cardiac catheterization N/A 07/16/2014    Procedure: Right/Left Heart Cath and  Coronary Angiography;  Surgeon: Burnell Blanks, MD;  Location: [facility];  Service: Cardiovascular;  Laterality: N/A;  . Coronary artery bypass graft    . Video bronchoscopy with endobronchial navigation N/A 07/29/2014    Procedure: VIDEO BRONCHOSCOPY WITH ENDOBRONCHIAL NAVIGATION;  Surgeon: Collene Gobble, MD;  Location: [facility];  Service: Thoracic;  Laterality: N/A;    History   Social History  . Marital Status: Married    Spouse Name: Steve Baker  . Number of Children: 2  . Years of Education: college   Occupational History  . retired    Social History Main Topics  . Smoking status: Current Every Day Smoker -- 2.00 packs/day for 56 years    Types: Cigarettes  . Smokeless tobacco: Not on file  . Alcohol Use: 1.8 oz/week    3 Cans of beer, 0 Standard drinks or equivalent per week     Comment: daily  . Drug Use: No  . Sexual Activity: Not on file   Other Topics Concern  . Not on file   Social History Narrative    Family History  Problem Relation Age of Onset  . Heart Problems Mother   . Heart attack Father   . Stroke Neg Hx     No current facility-administered medications on file prior to encounter.   Current Outpatient Prescriptions on File Prior to Encounter  Medication Sig Dispense Refill  .  aspirin EC 81 MG tablet Take 81 mg by mouth daily.    . furosemide (LASIX) 40 MG tablet Take 40 mg by mouth as needed for edema.   0  . lisinopril (PRINIVIL,ZESTRIL) 20 MG tablet Take 20 mg by mouth daily.    . pravastatin (PRAVACHOL) 40 MG tablet Take 40 mg by mouth daily.    . Tiotropium Bromide Monohydrate (SPIRIVA RESPIMAT) 2.5 MCG/ACT AERS Inhale 2 puffs into the lungs daily. 1 Inhaler 4    No Known Allergies  REVIEW OF SYSTEMS:  (Positives checked otherwise negative)  CARDIOVASCULAR:  '[ ]'$  chest pain, '[ ]'$  chest pressure, '[ ]'$  palpitations, '[ ]'$  shortness of breath when laying flat, '[ ]'$  shortness of breath with exertion,   [x ] pain in legs when walking, '[ ]'$   pain in feet when laying flat, '[ ]'$  history of blood clot in veins (DVT), '[ ]'$  history of phlebitis, '[ ]'$  swelling in legs, '[ ]'$  varicose veins  PULMONARY:  '[ ]'$  productive cough, '[ ]'$  asthma, '[ ]'$  wheezing  NEUROLOGIC:  '[ ]'$  weakness in arms or legs, [x ] numbness left foot, '[ ]'$  difficulty speaking or slurred speech, '[ ]'$  temporary loss of vision in one eye, '[ ]'$  dizziness  HEMATOLOGIC:  '[ ]'$  bleeding problems, '[ ]'$  problems with blood clotting too easily  MUSCULOSKEL:  '[ ]'$  joint pain, '[ ]'$  joint swelling  GASTROINTEST:  '[ ]'$   Vomiting blood, '[ ]'$   Blood in stool     GENITOURINARY:  '[ ]'$   Burning with urination, '[ ]'$   Blood in urine  PSYCHIATRIC:  '[ ]'$  history of major depression  INTEGUMENTARY:  '[ ]'$  rashes, '[ ]'$  ulcers  CONSTITUTIONAL:  '[ ]'$  fever, '[ ]'$  chills  For VQI Use Only   PRE-ADM LIVING: Home  AMB STATUS: Ambulatory  CAD Sx: History of MI, but no symptoms No MI within 6 months  PRIOR CHF: None  STRESS TEST: '[ ]'$  No, '[ ]'$  Normal, '[ ]'$  + ischemia, '[ ]'$  + MI, '[ ]'$  Both   Physical Examination Filed Vitals:   08/05/14 0940 08/05/14 0945 08/05/14 1008 08/05/14 1018  BP: 130/70 131/67 135/68 103/84  Pulse: 70 71 97 93  Temp:      TempSrc:      Resp: 13 18    Height:      Weight:      SpO2: 95% 89% 93% 94%   Body mass index is 29.2 kg/(m^2).  General: A&O x 3, WDWN in NAD  Head: [place]/A  Neck: Supple, no nuchal rigidity  Pulmonary: Sym exp, good air movt, clear to auscultation anterior   Cardiac: RRR, Nl S1, S2, no Murmurs, rubs or gallops, no carotid bruits  Vascular: 2+ rightt radial pulse, non palpable left radial pulse. Right groin sheath site without hematoma. Left femoral 2+. 1+ right dorsalis pedis pulse. Non palpable left pedal pulses.   Gastrointestinal: soft, NTND, -G/R, - HSM, - masses  Musculoskeletal: M/S 5/5 throughout. Extremities without ischemic changes. Rubor of bilateral toes.   Neurologic: CN 2-12 grossly intact. Pain and light touch intact in extremities,  Motor exam as listed above  Psychiatric: Judgment intact, Mood & affect appropriatefor pt's clinical situation  Dermatologic: See M/S exam for extremity exam, no rashes otherwise noted  Medical Decision Making  Zabian Hetland is a 74 y.o. male who presents with: bilateral lifestyle limiting intermittent claudication, left significantly greater than right. He underwent aortogram with bilateral runoff with Dr. Fletcher Anon which revealed options for  revascularization. The patient has a recent diagnosis of pulmonary adenocarcinoma. He will see a thoracic oncologist next Thursday 08/13/14. Discussed that he is not at current risk for limb loss and may proceed with revascularization on an elective basis or just continue with observation. The patient would like to proceed with surgery for his severe claudication but was advised to see the thoracic oncologist first. We will formulate plans for surgery afterwards. Dr. Donnetta Hutching to see patient.   Virgina Jock, PA-C [facility] Office: [phone] Pager: [phone]  08/05/2014, 10:54 AM    I have examined the patient, reviewed and agree with above. 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 Presents with severe claudication left calf and lesser so in his right calf. Rest pain or tissue loss. Has recent diagnosis of adenocarcinoma of his lung and is to see the thoracic oncologist in one week. Also status post coronary bypass grafting in 2001. Had harvest of his left vein from his knee to his ankle from physical exam standpoint.  Exam reveals easily palpable femoral pulses bilaterally with no false aneurysms present. No popliteal or distal pulses bilaterally  I reviewed his heart aortogram with bilateral extremity runoff from today and discussed at length with the patient and his wife present he does have occlusion of his left common femoral artery distal to the prior limb of  his aortofemoral bypass. He has a occlusion of his proximal deep femoral artery and occlusion of the superficial femoral artery. He does have reconstitution of the above-knee popliteal artery. There is a significant irregularity and calcification is tibioperoneal trunk on the left but does have patent vessels with 3 vessel runoff. On the right side he has a normal flow into the profunda with SFA occlusion.   That this is not limb threatening. He certainly is extremely incapacitated due to his left leg claudication which is related to no flow to his proximal profunda or superficial femoral artery. Do feel that he would be a good candidate for left common femoral endarterectomy with establishing flow to the deep femoral artery and also left femoral-popliteal bypass in the same setting. I explained that we would certainly defer decision to this until he has had the discussion with the thoracic oncologist related to his recent new diagnosis. The patient and family are comfortable with this decision. Will await further word from the oncologist pending this  Curt Jews, MD 08/05/2014 2:06 PM

## 2014-08-19 NOTE — Progress Notes (Signed)
  Day of Surgery Note    Subjective:  No complaints  Filed Vitals:   08/19/14 1510  BP: 109/66  Pulse: 95  Temp:   Resp: 20    Incisions:   Left groin soft and bandage is clean; left above knee bandage is clean  Extremities:  +doppler signals left DP/PT; left foot is warm   Assessment/Plan:  This is a 74 y.o. male who is s/p #1 left profundus femoral endarterectomy and Dacron patch angioplasty #2 left femoral to above-knee popliteal bypass with 6 mm probe patent Gore-Tex graft  -pt with patent bypass graft with +doppler signals left PT/DP -pain well controlled at this time    Leontine Locket, PA-C 08/19/2014 3:18 PM

## 2014-08-19 NOTE — Progress Notes (Signed)
378m LR completed while in PACU

## 2014-08-19 NOTE — Anesthesia Preprocedure Evaluation (Addendum)
Anesthesia Evaluation  Patient identified by MRN, date of birth, ID band Patient awake    Reviewed: Allergy & Precautions, NPO status , Patient's Chart, lab work & pertinent test results  Airway Mallampati: II  TM Distance: >3 FB Neck ROM: Full    Dental no notable dental hx. (+) Dental Advisory Given, Missing, Poor Dentition, Chipped, Loose,    Pulmonary shortness of breath and with exertion, COPDCurrent Smoker,  breath sounds clear to auscultation  Pulmonary exam normal       Cardiovascular hypertension, Pt. on medications + CAD, + CABG and + Peripheral Vascular Disease Normal cardiovascular exam+ dysrhythmias Rhythm:Regular Rate:Normal  Echo 07/16/2014 - Left ventricle: The cavity size was normal. Wall thickness was increased in a pattern of mild LVH. Systolic function was mildly to moderately reduced. The estimated ejection fraction was in the range of 40% to 45%. There is akinesis of the inferolateral myocardium. There is akinesis of the basalinferior myocardium. Doppler parameters are consistent with restrictive physiology, indicative of decreased left ventricular diastolic compliance and/or increased left atrial pressure. Doppler parameters are consistent with high ventricular filling pressure. - Aortic valve: There was trivial regurgitation. - Mitral valve: There was mild regurgitation. - Left atrium: The atrium was moderately dilated. - Right atrium: The atrium was moderately dilated. - Pulmonary arteries: Systolic pressure was moderately increased.PA peak pressure: 54 mm Hg (S).  Impressions: - Mild to moderate reduction in LV function; akinesis of the basal/mild inferior and inferior lateral wall; restrictive LV filling; biatrial enlargement; mild MR; trace AI; moderately increased pulmonary pressure.    Neuro/Psych PSYCHIATRIC DISORDERS Anxiety negative neurological ROS  negative psych ROS    GI/Hepatic negative GI ROS, Neg liver ROS,   Endo/Other  negative endocrine ROS  Renal/GU negative Renal ROS     Musculoskeletal negative musculoskeletal ROS (+)   Abdominal   Peds  Hematology negative hematology ROS (+) anemia ,   Anesthesia Other Findings   Reproductive/Obstetrics                          Anesthesia Physical Anesthesia Plan  ASA: IV  Anesthesia Plan: General   Post-op Pain Management:    Induction: Intravenous  Airway Management Planned: Oral ETT  Additional Equipment: Arterial line  Intra-op Plan:   Post-operative Plan: Extubation in OR  Informed Consent: I have reviewed the patients History and Physical, chart, labs and discussed the procedure including the risks, benefits and alternatives for the proposed anesthesia with the patient or authorized representative who has indicated his/her understanding and acceptance.   Dental advisory given  Plan Discussed with: CRNA  Anesthesia Plan Comments:        Anesthesia Quick Evaluation                                  Anesthesia Evaluation  Patient identified by MRN, date of birth, ID band Patient awake    Reviewed: Allergy & Precautions, NPO status , Patient's Chart, lab work & pertinent test results  Airway Mallampati: II   Neck ROM: Full    Dental  (+) Teeth Intact, Poor Dentition, Missing, Loose,    Pulmonary shortness of breath and with exertion, COPDCurrent Smoker,  breath sounds clear to auscultation        Cardiovascular hypertension, + CAD and + Peripheral Vascular Disease Rhythm:Regular Rate:Normal  Recent ECHO and cardiac cath are noted. Severe CAD c 2/4 grafts  patent only.   Neuro/Psych    GI/Hepatic   Endo/Other    Renal/GU      Musculoskeletal   Abdominal   Peds  Hematology  (+) Blood dyscrasia, anemia ,   Anesthesia Other Findings   Reproductive/Obstetrics                              Anesthesia Physical Anesthesia Plan  ASA: IV  Anesthesia Plan: General   Post-op Pain Management:    Induction: Intravenous  Airway Management Planned: Oral ETT  Additional Equipment:   Intra-op Plan:   Post-operative Plan: Extubation in OR  Informed Consent: I have reviewed the patients History and Physical, chart, labs and discussed the procedure including the risks, benefits and alternatives for the proposed anesthesia with the patient or authorized representative who has indicated his/her understanding and acceptance.   Dental advisory given  Plan Discussed with: Surgeon  Anesthesia Plan Comments: (Increased risk for gen anesth and surg due to CV considerations)        Anesthesia Quick Evaluation

## 2014-08-19 NOTE — Interval H&P Note (Signed)
History and Physical Interval Note:  08/19/2014 7:19 AM  Steve Baker  has presented today for surgery, with the diagnosis of Peripheral Vascular Disease with rest pain left leg  I70.222  The various methods of treatment have been discussed with the patient and family. After consideration of risks, benefits and other options for treatment, the patient has consented to  Procedure(s): PROFUNA FEMORIS ENDARTERECTOMY  (Left) BYPASS GRAFT FEMORAL-POPLITEAL ARTERY WITH GORTEX (Left) as a surgical intervention .  The patient's history has been reviewed, patient examined, no change in status, stable for surgery.  I have reviewed the patient's chart and labs.  Questions were answered to the patient's satisfaction.     Curt Jews

## 2014-08-19 NOTE — Anesthesia Procedure Notes (Signed)
Procedure Name: Intubation Date/Time: 08/19/2014 8:49 AM Performed by: Garrison Columbus T Pre-anesthesia Checklist: Patient identified, Emergency Drugs available, Suction available and Patient being monitored Patient Re-evaluated:Patient Re-evaluated prior to inductionOxygen Delivery Method: Circle system utilized Preoxygenation: Pre-oxygenation with 100% oxygen Intubation Type: IV induction and Rapid sequence Ventilation: Mask ventilation without difficulty and Oral airway inserted - appropriate to patient size Laryngoscope Size: Sabra Heck and 2 Grade View: Grade I Tube type: Oral Tube size: 7.5 mm Number of attempts: 1 Airway Equipment and Method: Stylet and Oral airway Placement Confirmation: ETT inserted through vocal cords under direct vision,  positive ETCO2 and breath sounds checked- equal and bilateral Secured at: 23 cm Tube secured with: Tape Dental Injury: Teeth and Oropharynx as per pre-operative assessment

## 2014-08-19 NOTE — Op Note (Signed)
OPERATIVE REPORT  DATE OF SURGERY: 08/19/2014  PATIENT: Steve Baker, 74 y.o. male MRN: [id number]  DOB: 09/09/1940  PRE-OPERATIVE DIAGNOSIS: Rest pain left lower extremity  POST-OPERATIVE DIAGNOSIS:  Same  PROCEDURE: #1 left profundus femoral endarterectomy and Dacron patch angioplasty #2 left femoral to above-knee popliteal bypass with 6 mm probe patent Gore-Tex graft  SURGEON:  Curt Jews, M.D.  PHYSICIAN ASSISTANT: Collins  ANESTHESIA:  Gen.  EBL: 200 ml  Total I/O In: 2500 [I.V.:2000; IV Piggyback:500] Out: 400 [Urine:200; Blood:200]  BLOOD ADMINISTERED: None  DRAINS: None  SPECIMEN: None  COUNTS CORRECT:  YES  PLAN OF CARE: PACU   PATIENT DISPOSITION:  PACU - hemodynamically stable  PROCEDURE DETAILS: Patient was taken to the operative placed supine position where the area of the left groin left leg were prepped and draped in sterile fashion. Incision was made over the common femoral artery pulse and carried down to isolate the prior left limb of his aortofemoral graft. The external iliac superficial femoral and profundus femoris arteries were also isolated. 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 Profunda was occluded as well with reconstitution several centimeters below the origin of the profundus femoris artery. The patient old aortobifemoral bypass was widely patentproximal 15 years prior. Next a separate incision was made the medial aspect of the left above-knee popliteal artery. The artery was exposed and was extensively calcified but did have lumen just above the level of the knee. A tunnel was created from the level of the above-knee popliteal artery to the groin. The patient was given 8000 units intravenous heparin. After adequate circulation time the left limb of the aortofemoral bypass was occluded the external iliac artery was occluded with a Henley clamp and the branches of the profundus femoris arteries were  occluded. The superficial femoral artery was ligated and was transected at its origin. This was chronically occluded. The common femoral artery was opened from the area of the transected superficial femoral artery onto the lid the left limb of the aortofemoral graft. Arteriotomy was continued down on the profundus femoris artery until the lumen was encountered. The common and profundus femoris arteries were endarterectomized. A Finesse Hemashield Dacron patch was brought onto the field and sewn as a patch angioplasty from the profundus up onto the hood of the prior existing aorto left femoral graft. This was with 5060 proline sutures. This anastomosis was tested and found to be adequate. Next a 6 mm probe patent Gore-Tex graft was brought onto the field. This was a ringed graft. The Dacron graft was placed onto the common femoral artery was opened with 11 blade small shouldn't with Potts scissors. The Gore-Tex was sewn end-to-side to Dacron patch with a running 60 proline suture. Anastomosis tested and found to be adequate. The graft was flushed heparinized saline and reoccluded. The graft was then brought to the prior created tunnel down to the level of the above-knee popliteal artery. The patient had a 24 inch tourniquet placed around the proximal thigh. The leg was elevated and exsanguinated with an Esmarch tourniquet and the tourniquet was inflated. The popliteal artery was opened with 11 blade to a soft area and was extended with Potts scissors. The graft was cut to appropriate length and was spatulated and sewn end-to-side to the artery with a running 6-0 chromic suture. Prior to completion of the anastomosis the tourniquet was deflated undertaken. Anastomosis was completed and excellent flow was noted in the graft and also at the  dorsalis pedis and posterior tibial the ankle with Doppler the patient was given 50 mg of protamine to reverse the heparin. Wounds irrigated with saline. Hemostasis tablet cautery.  Wounds were closed with 2-0 Monocryl in the fascia and subcutaneous tissue. Skin was closed with layers of 2 carotid suture. Sterile dressing was applied the patient was transferred to the recovery room in stable condition   Curt Jews, M.D. 08/19/2014 1:08 PM

## 2014-08-19 NOTE — Consult Note (Signed)
PHARMACY CONSULT NOTE--POST-PROCEDURE ANTIBIOTICS   Consult  :  Post Procedure Dosing of:  Zinacef Indication :  Empiric Post-Procedure Antibiotic Prophylaxis   Pharmacy consulted for post-procedure dosing of Zinacef s/p #1 left profundus femoral endarterectomy and Dacron patch angioplasty, #2 left femoral to above-knee popliteal bypass .    Patient is to receive Zinacef x 2 doses post-op  Wt 87 kg,  CrCl ~76 ml/min.  PLAN:  1. Zinacef 1.5g IV q12h x 2 doses ordered - dosing adjustments are not required. 2. Pharmacy will sign off. Please reconsult if additional assistance is needed.   Thank you for allowing Pharmacy to participate in this patient's care.   Lorilee Cafarella, Craig Guess,  Pharm.D.,  08/19/2014,  3:31 PM

## 2014-08-19 NOTE — Transfer of Care (Signed)
Immediate Anesthesia Transfer of Care Note  Patient: Steve Baker  Procedure(s) Performed: Procedure(s): PROFUNDA FEMORIS ENDARTERECTOMY  (Left) FEMORAL-POPLITEAL ARTERY BYPASS GRAFT WITH PROPATEN GORTEX  (Left) LEFT PROFUNDA FEMORIS PATCH ANGIOPLASTY USING HEMASHIELD PLATINUM FINESSE PATCH (Left)  Patient Location: PACU  Anesthesia Type:General  Level of Consciousness: awake, alert  and oriented  Airway & Oxygen Therapy: Patient Spontanous Breathing and Patient connected to nasal cannula oxygen  Post-op Assessment: Report given to RN, Post -op Vital signs reviewed and stable and Patient moving all extremities X 4  Post vital signs: Reviewed and stable  Last Vitals:  Filed Vitals:   08/19/14 0647  BP: 124/61  Pulse:   Temp:   Resp:     Complications: No apparent anesthesia complications

## 2014-08-20 ENCOUNTER — Inpatient Hospital Stay (HOSPITAL_COMMUNITY): Payer: Medicare Other

## 2014-08-20 DIAGNOSIS — Z9889 Other specified postprocedural states: Secondary | ICD-10-CM

## 2014-08-20 LAB — CBC
HCT: 23.6 % — ABNORMAL LOW (ref 39.0–52.0)
HEMOGLOBIN: 6.6 g/dL — AB (ref 13.0–17.0)
MCH: 18.8 pg — ABNORMAL LOW (ref 26.0–34.0)
MCHC: 28 g/dL — AB (ref 30.0–36.0)
MCV: 67 fL — ABNORMAL LOW (ref 78.0–100.0)
Platelets: 224 10*3/uL (ref 150–400)
RBC: 3.52 MIL/uL — AB (ref 4.22–5.81)
RDW: 20.3 % — ABNORMAL HIGH (ref 11.5–15.5)
WBC: 5.4 10*3/uL (ref 4.0–10.5)

## 2014-08-20 LAB — POCT I-STAT 4, (NA,K, GLUC, HGB,HCT)
GLUCOSE: 158 mg/dL — AB (ref 65–99)
HCT: 29 % — ABNORMAL LOW (ref 39.0–52.0)
Hemoglobin: 9.9 g/dL — ABNORMAL LOW (ref 13.0–17.0)
POTASSIUM: 4.6 mmol/L (ref 3.5–5.1)
Sodium: 135 mmol/L (ref 135–145)

## 2014-08-20 LAB — BASIC METABOLIC PANEL
ANION GAP: 8 (ref 5–15)
BUN: 7 mg/dL (ref 6–20)
CALCIUM: 7.8 mg/dL — AB (ref 8.9–10.3)
CHLORIDE: 101 mmol/L (ref 101–111)
CO2: 24 mmol/L (ref 22–32)
Creatinine, Ser: 0.98 mg/dL (ref 0.61–1.24)
GFR calc Af Amer: >60 mL/min (ref >60)
GFR calc non Af Amer: >60 mL/min (ref >60)
Glucose, Bld: 90 mg/dL (ref 65–99)
Potassium: 4.4 mmol/L (ref 3.5–5.1)
SODIUM: 133 mmol/L — AB (ref 135–145)

## 2014-08-20 LAB — POCT I-STAT 7, (LYTES, BLD GAS, ICA,H+H)
ACID-BASE DEFICIT: 1 mmol/L (ref 0.0–2.0)
Bicarbonate: 23.5 mEq/L (ref 20.0–24.0)
CALCIUM ION: 1.21 mmol/L (ref 1.13–1.30)
HEMATOCRIT: 29 % — AB (ref 39.0–52.0)
Hemoglobin: 9.9 g/dL — ABNORMAL LOW (ref 13.0–17.0)
O2 Saturation: 100 %
PO2 ART: 269 mmHg — AB (ref 80.0–100.0)
POTASSIUM: 4.7 mmol/L (ref 3.5–5.1)
Patient temperature: 36.2
Sodium: 135 mmol/L (ref 135–145)
TCO2: 25 mmol/L (ref 0–100)
pCO2 arterial: 35.7 mmHg (ref 35.0–45.0)
pH, Arterial: 7.423 (ref 7.350–7.450)

## 2014-08-20 LAB — PREPARE RBC (CROSSMATCH)

## 2014-08-20 MED ORDER — SODIUM CHLORIDE 0.9 % IV SOLN: Freq: Once | INTRAVENOUS | Status: DC

## 2014-08-20 NOTE — Evaluation (Signed)
Physical Therapy Evaluation Patient Details Name: Steve Baker MRN: [id number] DOB: 1941/02/15 Today's Date: 08/20/2014   History of Present Illness  This 74 y.o. male admitted for Lt profundus endarterectomy and Lt fem-pop Bypass graft.  PMH includes : COPD, anxiety, PVD, HTN, prostate CA    Clinical Impression  Pt admitted with/for BPGing.  Pt currently limited functionally due to the problems listed below.  (see problems list.)  Pt will benefit from PT to maximize function and safety to be able to get home safely with available assist of family.     Follow Up Recommendations No PT follow up;Supervision - Intermittent;Supervision for mobility/OOB    Equipment Recommendations  Rolling walker with 5" wheels    Recommendations for Other Services       Precautions / Restrictions Precautions Precautions: Fall Restrictions Weight Bearing Restrictions: No      Mobility  Bed Mobility Overal bed mobility: Needs Assistance Bed Mobility: Supine to Sit     Supine to sit: Min assist     General bed mobility comments: Min a to bring trunk to EOB   Transfers Overall transfer level: Needs assistance   Transfers: Sit to/from Stand Sit to Stand: Min assist;+2 physical assistance Stand pivot transfers: Min assist       General transfer comment: Pt requires assist to power up into standing due to pain.  Min A progressing to min guard assist with transfers and ambulation   Ambulation/Gait Ambulation/Gait assistance: Min assist;Min guard Ambulation Distance (Feet): 130 Feet Assistive device: Rolling walker (2 wheeled) Gait Pattern/deviations: Step-to pattern Gait velocity: slow   General Gait Details: Cues for sequencing to decrease pain.  Initial min assist then min guard for safety and fatigue.  Stairs            Wheelchair Mobility    Modified Rankin (Stroke Patients Only)       Balance Overall balance assessment: Needs assistance Sitting-balance support: Feet  supported;Bilateral upper extremity supported Sitting balance-Leahy Scale: Fair     Standing balance support: Bilateral upper extremity supported Standing balance-Leahy Scale: Poor Standing balance comment: pt reliant on UE's                             Pertinent Vitals/Pain Pain Assessment: 0-10 Pain Score: 4  Pain Location: L LE Pain Descriptors / Indicators: Aching;Burning;Grimacing Pain Intervention(s): Monitored during session    Home Living Family/patient expects to be discharged to:: Private residence Living Arrangements: Spouse/significant other Available Help at Discharge: Family;Available 24 hours/day Type of Home: House Home Access: Stairs to enter Entrance Stairs-Rails: Right;Left;Can reach both Entrance Stairs-Number of Steps: 4 Home Layout: One level Home Equipment: None      Prior Function Level of Independence: Independent         Comments: Pt denies falls      Hand Dominance   Dominant Hand: Right    Extremity/Trunk Assessment   Upper Extremity Assessment: Defer to OT evaluation           Lower Extremity Assessment: Generalized weakness;Overall [facility] for tasks assessed      Cervical / Trunk Assessment: Normal  Communication   Communication: No difficulties  Cognition Arousal/Alertness: Awake/alert Behavior During Therapy: WFL for tasks assessed/performed Overall Cognitive Status: Within Functional Limits for tasks assessed                      General Comments      Exercises  Assessment/Plan    PT Assessment Patient needs continued PT services  PT Diagnosis Difficulty walking;Acute pain   PT Problem List Decreased strength;Decreased activity tolerance;Decreased mobility;Decreased knowledge of use of DME;Decreased knowledge of precautions;Pain  PT Treatment Interventions Gait training;Stair training;DME instruction;Functional mobility training;Patient/family education   PT Goals (Current goals can be  found in the Care Plan section) Acute Rehab PT Goals Patient Stated Goal: to get back to normal  PT Goal Formulation: With patient Time For Goal Achievement: 08/27/14 Potential to Achieve Goals: Good    Frequency Min 3X/week   Barriers to discharge        Co-evaluation PT/OT/SLP Co-Evaluation/Treatment: Yes Reason for Co-Treatment: For patient/therapist safety PT goals addressed during session: Mobility/safety with mobility OT goals addressed during session: ADL's and self-care       End of Session   Activity Tolerance: Patient tolerated treatment well;Patient limited by pain Patient left: in chair;with call bell/phone within reach;with family/visitor present Nurse Communication: Mobility status         Time: 2883-3744 PT Time Calculation (min) (ACUTE ONLY): 28 min   Charges:   PT Evaluation $Initial PT Evaluation Tier I: 1 Procedure     PT G Codes:        Zelma Mazariego, Tessie Fass 08/20/2014, 3:13 PM  08/20/2014  Donnella Sham, [place] [phone]  (pager)

## 2014-08-20 NOTE — Progress Notes (Signed)
Rounding on Pt found him with pants and shoes on, agitated with tele monitor.  Refused to recline, refused offers of snacks.  RN left the room and within moments he was in hall headed toward elevators.  I caught up to him and asked where he was going.  He became agitated again.  We boarded the elevators together, as he was insisting on going outside.  I alerted the CN who called security.  When the elevator doors opened Pt got off and walked the unit with RN, conversing pleasantly. Eventually back to his room, sitting on edge of bed.  Again refusing all assistance.  Security came by the room, there was no need for intervention at that time.  CN aware, MD paged.

## 2014-08-20 NOTE — Progress Notes (Addendum)
[facility]  Subjective  - Doing well feels sleepy.   Objective 100/55 93 98.1 F (36.7 C) (Oral) 18 94%  Intake/Output Summary (Last 24 hours) at 08/20/14 0263 Last data filed at 08/20/14 0645  Gross per 24 hour  Intake 5463.25 ml  Output   2050 ml  Net 3413.25 ml    Doppler DP/PT biphasic left LE Groin soft no hematoma, popliteal incision dressing in place  Active range of motion intact Heart sinus rhythm   Assessment/Planning: POD # 1PROCEDURE: #1 left profundus femoral endarterectomy and Dacron patch angioplasty #2 left femoral to above-knee popliteal bypass with 6 mm probe patent Gore-Tex graft  Anemia of chronic disease transfuse PRBC and redraw labs in the am Incisions healing well will change dressing in am Transfer to 2W later today PT for mobility  Laurence Slate [facility] 08/20/2014 7:23 AM --  Laboratory Lab Results:  Recent Labs  08/19/14 1530 08/20/14 0330  WBC 4.6 5.4  HGB 7.5* 6.6*  HCT 26.7* 23.6*  PLT 261 224   BMET  Recent Labs  08/18/14 0945 08/19/14 1530 08/20/14 0330  NA 131*  --  133*  K 4.4  --  4.4  CL 100*  --  101  CO2 23  --  24  GLUCOSE 125*  --  90  BUN 9  --  7  CREATININE 0.91 0.91 0.98  CALCIUM 8.6*  --  7.8*    COAG Lab Results  Component Value Date   INR 1.09 08/18/2014   INR 1.0 07/28/2014   INR 1.1* 07/10/2014   No results found for: PTT    I have examined the patient, reviewed and agree with above. Doing well. Did not get much sleep last night. Mobilize transfer to [address] [place]. Possible discharge in a.m. if comfortable walking  Curt Jews, MD 08/20/2014 8:44 AM

## 2014-08-20 NOTE — Progress Notes (Signed)
Physical Therapy Treatment Patient Details Name: Steve Baker MRN: [id number] DOB: [birth year] Today's Date: 08/20/2014    History of Present Illness This 74 y.o. male admitted for Lt profundus endarterectomy and Lt fem-pop Bypass graft.  PMH includes : COPD, anxiety, PVD, HTN, prostate CA      PT Comments    Progressing as expected.  Follow Up Recommendations  No PT follow up;Supervision - Intermittent;Supervision for mobility/OOB     Equipment Recommendations  Rolling walker with 5" wheels    Recommendations for Other Services       Precautions / Restrictions Precautions Precautions: Fall Restrictions Weight Bearing Restrictions: No    Mobility  Bed Mobility Overal bed mobility: Needs Assistance Bed Mobility: Supine to Sit     Supine to sit: Min assist     General bed mobility comments: Min a to bring trunk to EOB   Transfers Overall transfer level: Needs assistance Equipment used: Rolling walker (2 wheeled) Transfers: Sit to/from Stand Sit to Stand: Min assist Stand pivot transfers: Min assist       General transfer comment: Pt requires assist to power up into standing due to pain.  Min A progressing to min guard assist with transfers and ambulation   Ambulation/Gait Ambulation/Gait assistance: Min assist Ambulation Distance (Feet): 15 Feet (X2 TO/FROM BATHROOM) Assistive device: Rolling walker (2 wheeled) Gait Pattern/deviations: Step-to pattern Gait velocity: slow   General Gait Details: pt again increased at first from rest period   Stairs            Wheelchair Mobility    Modified Rankin (Stroke Patients Only)       Balance Overall balance assessment: Needs assistance Sitting-balance support: Feet supported;Bilateral upper extremity supported Sitting balance-Leahy Scale: Fair     Standing balance support: Bilateral upper extremity supported Standing balance-Leahy Scale: Poor Standing balance comment: holding to RW when standing to  urinate                    Cognition Arousal/Alertness: Awake/alert Behavior During Therapy: WFL for tasks assessed/performed Overall Cognitive Status: Within Functional Limits for tasks assessed                      Exercises      General Comments        Pertinent Vitals/Pain Pain Assessment: Faces Pain Score: 4  Faces Pain Scale: Hurts little more Pain Location: L leg Pain Descriptors / Indicators: Aching Pain Intervention(s): Monitored during session    Home Living Family/patient expects to be discharged to:: Private residence Living Arrangements: Spouse/significant other Available Help at Discharge: Family;Available 24 hours/day Type of Home: House Home Access: Stairs to enter Entrance Stairs-Rails: Right;Left;Can reach both Home Layout: One level Home Equipment: None      Prior Function Level of Independence: Independent      Comments: Pt denies falls    PT Goals (current goals can now be found in the care plan section) Acute Rehab PT Goals Patient Stated Goal: to get back to normal  PT Goal Formulation: With patient Time For Goal Achievement: 08/27/14 Potential to Achieve Goals: Good Progress towards PT goals: Progressing toward goals    Frequency  Min 3X/week    PT Plan Current plan remains appropriate    Co-evaluation PT/OT/SLP Co-Evaluation/Treatment: Yes Reason for Co-Treatment: For patient/therapist safety PT goals addressed during session: Mobility/safety with mobility OT goals addressed during session: ADL's and self-care     End of Session   Activity Tolerance: Patient tolerated  treatment well;Patient limited by pain Patient left: in chair;with call bell/phone within reach;with family/visitor present     Time: 4514-6047 PT Time Calculation (min) (ACUTE ONLY): 10 min  Charges:  $Gait Training: 8-22 mins                    G Codes:      Corbet Hanley, Tessie Fass 08/20/2014, 3:20 PM 08/20/2014  Donnella Sham,  PT [phone]  (pager)

## 2014-08-20 NOTE — Progress Notes (Signed)
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     RIGHT    LEFT    PRESSURE WAVEFORM  PRESSURE WAVEFORM  BRACHIAL 141 Triphasic BRACHIAL 131 Triphasic  DP 106 Biphasic DP 97 Biphasic  PT 94 Biphasic PT 112 Biphasic    RIGHT LEFT  ABI 0.75 0.79     Krue Peterka D, RVT 08/20/2014, 4:51 PM

## 2014-08-20 NOTE — Progress Notes (Signed)
Pt transferred to 2W, room 37 via wheelchair; A/O x 4.

## 2014-08-20 NOTE — Care Management Note (Addendum)
Case Management Note  Patient Details  Name: Steve Baker MRN: [id number] Date of Birth: 1941-02-13  Subjective/Objective:     From home with wife admitted with rest pain left lower extremity, s/p L fem/pop.          Action/Plan:  Return to home when medically stable. CM to f/u with d/c needs Expected Discharge Date:                  Expected Discharge Plan:  Home/Self Care  In-House Referral:     Discharge planning Services  CM Consult  Post Acute Care Choice:    Choice offered to:     DME Arranged:    DME Agency:     HH Arranged:    HH Agency:     Status of Service:  In process, will continue to follow  Medicare Important Message Given:    Date Medicare IM Given:    Medicare IM give by:    Date Additional Medicare IM Given:    Additional Medicare Important Message give by:     If discussed at [place] of Stay Meetings, dates discussed:    Additional Comments: Izora Gala (wife) [phone] Sharin Mons, RN 08/20/2014, 10:04 AM

## 2014-08-20 NOTE — Progress Notes (Signed)
F/U note - Pt now refusing tele, increasingly agitated.  Son now at bedside.  MD aware, orders received, will report thoroughly to night RN.

## 2014-08-20 NOTE — Progress Notes (Signed)
CRITICAL VALUE ALERT  Critical value received:  6.6 HgB  Date of notification:  08/20/2014  Time of notification:  045  Critical value read back:  yes  Nurse who received alert:  Renelda Loma RN  MD notified (1st page):  Dr. Kellie Simmering  Time of first page:  0500  Responding MD:  Dr. Kellie Simmering  Time MD responded:  0500  Verbal orders were given to transfuse 1 unit of PRBCs.

## 2014-08-20 NOTE — Progress Notes (Signed)
Rounding performed. Patient's son left at 2130. Patient now alone until sitter availability at 2300. Observed sitting in chair, sliding box of cigarettes across floor with foot. Patient advised against smoking, especially in room. Offered to place cigarettes on chart, but patient opted to flush them down the toilet instead. Patient instructed to remain seated/lying and to call for assistance, but refuses to do so.  Non-skid footwear on (yellow socks). Fall sign outside of door. Bed/Chair alarms not practical due to patient's being ambulatory and independent. Door left partially open, but just closed by patient.  Patient is alert and oriented.

## 2014-08-20 NOTE — Evaluation (Signed)
Occupational Therapy Evaluation Patient Details Name: Steve Baker MRN: [id number] DOB: 1940/09/03 Today's Date: 08/20/2014    History of Present Illness This 74 y.o. male admitted for Lt profundus endarterectomy and Lt fem-pop Bypass graft.  PMH includes : COPD, anxiety, PVD, HTN, prostate CA     Clinical Impression   Pt admitted with above. He demonstrates the below listed deficits and will benefit from continued OT to maximize safety and independence with BADLs.  Pt presents to OT with generalized weakness and acute pain.  Currently, he requires mod - max A for LB ADLs, and min A - min guard assist for functional mobility.  He has good family support at discharge.  Anticipate good progress and pt will be able to return home with family and no follow up OT. Will follow acutely.       Follow Up Recommendations  No OT follow up;Supervision/Assistance - 24 hour    Equipment Recommendations  3 in 1 bedside comode    Recommendations for Other Services       Precautions / Restrictions Precautions Precautions: Fall Restrictions Weight Bearing Restrictions: No      Mobility Bed Mobility Overal bed mobility: Needs Assistance Bed Mobility: Supine to Sit     Supine to sit: Min assist     General bed mobility comments: Min a to bring trunk to EOB   Transfers Overall transfer level: Needs assistance   Transfers: Sit to/from Stand;Stand Pivot Transfers Sit to Stand: Min assist;+2 physical assistance Stand pivot transfers: Min assist       General transfer comment: Pt requires assist to power up into standing due to pain.  Min A progressing to min guard assist with transfers and ambulation     Balance Overall balance assessment: Needs assistance Sitting-balance support: Feet supported;Bilateral upper extremity supported Sitting balance-Leahy Scale: Fair     Standing balance support: Bilateral upper extremity supported Standing balance-Leahy Scale: Poor Standing balance  comment: Pt reliant on UE support                             ADL Overall ADL's : Needs assistance/impaired Eating/Feeding: Independent   Grooming: Wash/dry hands;Wash/dry face;Oral care;Brushing hair;Minimal assistance;Standing   Upper Body Bathing: Set up;Sitting   Lower Body Bathing: Moderate assistance;Sit to/from stand   Upper Body Dressing : Set up;Sitting   Lower Body Dressing: Maximal assistance;Sit to/from stand Lower Body Dressing Details (indicate cue type and reason): Pt unable to access bil. feet due to pain  Toilet Transfer: Minimal assistance;+2 for physical assistance;Ambulation;BSC;RW Toilet Transfer Details (indicate cue type and reason): Pt required min A +2 to power up into standing due to pain  Toileting- Clothing Manipulation and Hygiene: Moderate assistance;Sit to/from stand       Functional mobility during ADLs: Minimal assistance;+2 for physical assistance;Rolling walker       Vision     Perception     Praxis      Pertinent Vitals/Pain Pain Assessment: 0-10 Pain Score: 4  Pain Location: Lt LE  Pain Descriptors / Indicators: Aching;Burning;Grimacing Pain Intervention(s): Monitored during session     Hand Dominance Right   Extremity/Trunk Assessment Upper Extremity Assessment Upper Extremity Assessment: Overall WFL for tasks assessed   Lower Extremity Assessment Lower Extremity Assessment: Defer to PT evaluation   Cervical / Trunk Assessment Cervical / Trunk Assessment: Normal   Communication Communication Communication: No difficulties   Cognition Arousal/Alertness: Awake/alert Behavior During Therapy: WFL for tasks assessed/performed Overall  Cognitive Status: Within Functional Limits for tasks assessed                     General Comments       Exercises       Shoulder Instructions      Home Living Family/patient expects to be discharged to:: Private residence Living Arrangements: Spouse/significant  other Available Help at Discharge: Family;Available 24 hours/day Type of Home: House Home Access: Stairs to enter [employer] of Steps: 4 Entrance Stairs-Rails: Right;Left;Can reach both Home Layout: One level     Bathroom Shower/Tub: Tub/shower unit;Curtain Shower/tub characteristics: Architectural technologist: Standard     Home Equipment: None          Prior Functioning/Environment Level of Independence: Independent        Comments: Pt denies falls     OT Diagnosis: Generalized weakness;Acute pain   OT Problem List: Decreased strength;Decreased activity tolerance;Impaired balance (sitting and/or standing);Decreased safety awareness;Decreased knowledge of use of DME or AE;Pain   OT Treatment/Interventions: Self-care/ADL training;DME and/or AE instruction;Therapeutic activities;Patient/family education;Balance training    OT Goals(Current goals can be found in the care plan section) Acute Rehab OT Goals Patient Stated Goal: to get back to normal  OT Goal Formulation: With patient Time For Goal Achievement: 08/27/14 Potential to Achieve Goals: Good ADL Goals Pt Will Perform Grooming: with modified independence;standing Pt Will Perform Upper Body Bathing: with modified independence;sitting Pt Will Perform Lower Body Bathing: with modified independence;sit to/from stand Pt Will Perform Upper Body Dressing: with modified independence;sitting Pt Will Perform Lower Body Dressing: with modified independence;sit to/from stand Pt Will Transfer to Toilet: with modified independence;ambulating;regular height toilet;bedside commode;grab bars Pt Will Perform Toileting - Clothing Manipulation and hygiene: sit to/from stand;with modified independence Pt Will Perform Tub/Shower Transfer: Tub transfer;ambulating;shower seat;tub bench;rolling walker  OT Frequency: Min 2X/week   Barriers to D/C:            Co-evaluation PT/OT/SLP Co-Evaluation/Treatment: Yes Reason for  Co-Treatment: For patient/therapist safety   OT goals addressed during session: ADL's and self-care      End of Session Equipment Utilized During Treatment: Rolling walker Nurse Communication: Mobility status  Activity Tolerance: Patient tolerated treatment well Patient left: in chair;with call bell/phone within reach;with family/visitor present   Time: 2841-3244 OT Time Calculation (min): 23 min Charges:  OT General Charges $OT Visit: 1 Procedure OT Evaluation $Initial OT Evaluation Tier I: 1 Procedure G-Codes:    Lamarr Feenstra M 2014/09/14, 1:21 PM

## 2014-08-21 ENCOUNTER — Encounter (HOSPITAL_COMMUNITY): Payer: Self-pay | Admitting: Vascular Surgery

## 2014-08-21 LAB — BASIC METABOLIC PANEL
ANION GAP: 7 (ref 5–15)
BUN: 8 mg/dL (ref 6–20)
CALCIUM: 8.5 mg/dL — AB (ref 8.9–10.3)
CO2: 25 mmol/L (ref 22–32)
CREATININE: 0.96 mg/dL (ref 0.61–1.24)
Chloride: 101 mmol/L (ref 101–111)
GFR calc Af Amer: >60 mL/min (ref >60)
GFR calc non Af Amer: >60 mL/min (ref >60)
Glucose, Bld: 107 mg/dL — ABNORMAL HIGH (ref 65–99)
POTASSIUM: 4.1 mmol/L (ref 3.5–5.1)
Sodium: 133 mmol/L — ABNORMAL LOW (ref 135–145)

## 2014-08-21 LAB — CBC
HCT: 28.7 % — ABNORMAL LOW (ref 39.0–52.0)
Hemoglobin: 8.2 g/dL — ABNORMAL LOW (ref 13.0–17.0)
MCH: 19.9 pg — AB (ref 26.0–34.0)
MCHC: 28.6 g/dL — AB (ref 30.0–36.0)
MCV: 69.7 fL — ABNORMAL LOW (ref 78.0–100.0)
Platelets: 227 10*3/uL (ref 150–400)
RBC: 4.12 MIL/uL — ABNORMAL LOW (ref 4.22–5.81)
RDW: 21.9 % — ABNORMAL HIGH (ref 11.5–15.5)
WBC: 8 10*3/uL (ref 4.0–10.5)

## 2014-08-21 MED ORDER — FOLIC ACID 1 MG PO TABS
1.0000 mg | ORAL_TABLET | Freq: Every day | ORAL | Status: DC
  Administered 2014-08-21: 1 mg via ORAL
  Filled 2014-08-21: qty 1

## 2014-08-21 MED ORDER — NICOTINE 21 MG/24HR TD PT24
21.0000 mg | MEDICATED_PATCH | Freq: Every day | TRANSDERMAL | Status: DC
  Administered 2014-08-21: 21 mg via TRANSDERMAL
  Filled 2014-08-21: qty 1

## 2014-08-21 MED ORDER — LORAZEPAM 2 MG/ML IJ SOLN
1.0000 mg | Freq: Three times a day (TID) | INTRAMUSCULAR | Status: DC | PRN
  Administered 2014-08-21: 1 mg via INTRAVENOUS
  Filled 2014-08-21: qty 1

## 2014-08-21 MED ORDER — OXYCODONE-ACETAMINOPHEN 5-325 MG PO TABS: 1.0000 | ORAL_TABLET | Freq: Four times a day (QID) | ORAL | Status: DC | PRN

## 2014-08-21 MED ORDER — VITAMIN B-1 100 MG PO TABS
100.0000 mg | ORAL_TABLET | Freq: Every day | ORAL | Status: DC
  Administered 2014-08-21: 100 mg via ORAL
  Filled 2014-08-21: qty 1

## 2014-08-21 MED ORDER — LORAZEPAM 1 MG PO TABS: 1.0000 mg | ORAL_TABLET | Freq: Three times a day (TID) | ORAL | Status: DC | PRN

## 2014-08-21 MED ORDER — LORAZEPAM 2 MG/ML IJ SOLN: 1.0000 mg | Freq: Three times a day (TID) | INTRAMUSCULAR | Status: DC | PRN

## 2014-08-21 MED ORDER — SPIRITUS FRUMENTI
1.0000 | Freq: Four times a day (QID) | ORAL | Status: DC
  Filled 2014-08-21 (×4): qty 1

## 2014-08-21 NOTE — Progress Notes (Signed)
PATIENT IS AGAIN REFUSING TO WEAR TELEMETRY. CCMD NOTIFIED.  SITTER PRESENT WITH PATIENT SINCE 2300.

## 2014-08-21 NOTE — Progress Notes (Signed)
OT Cancellation Note  Patient Details Name: Steve Baker MRN: [id number] DOB: 1941/02/11   Cancelled Treatment:    Reason Eval/Treat Not Completed: Other (comment) -- Per chart review, note patient refusing a lot of treatment/care and tried to leave the hospital last night. Has sitter. Patient currently asleep. Spoke to patient's RN; she advised to let patient rest at this time. Will continue OT as able per plan of care.  Jobie Popp A 08/21/2014, 9:36 AM

## 2014-08-21 NOTE — Progress Notes (Signed)
     The patient removed IV and refused medications as well as tele.  Dr. Donnetta Hutching discussed the episodes with the family and they have stated he drinks alcohol and smokes regularly.  We have ordered nicotine patch and beer q6..  The patient is asleep.  We will see again later today.  COLLINS, EMMA MAUREEN PA-C

## 2014-08-21 NOTE — Progress Notes (Signed)
Pt  resting in room quietly with wife present.  Patient has calmed down enough to allow staff to approach. Pt resting with call bell within reach.  Will continue to monitor. Payton Emerald, RN

## 2014-08-21 NOTE — Anesthesia Postprocedure Evaluation (Signed)
Anesthesia Post Note  Patient: Steve Baker  Procedure(s) Performed: Procedure(s) (LRB): PROFUNDA FEMORIS ENDARTERECTOMY  (Left) FEMORAL-POPLITEAL ARTERY BYPASS GRAFT WITH PROPATEN GORTEX  (Left) LEFT PROFUNDA FEMORIS PATCH ANGIOPLASTY USING HEMASHIELD PLATINUM FINESSE PATCH (Left)  Anesthesia type: General  Patient location: PACU  Post pain: Pain level controlled  Post assessment: Post-op Vital signs reviewed  Last Vitals: BP 119/69 mmHg  Pulse 100  Temp(Src) 36.5 C (Oral)  Resp 20  Ht '5\' 8"'$  (1.727 m)  Wt 191 lb 12.8 oz (87 kg)  BMI 29.17 kg/m2  SpO2 95%  Post vital signs: Reviewed  Level of consciousness: sedated  Complications: No apparent anesthesia complications

## 2014-08-21 NOTE — Progress Notes (Signed)
Patient ID: Steve Baker, male   DOB: [birth year], 74 y.o.   MRN: [id number] Patient currently sleeping. His wife and son are present. I had a long discussion with them regarding the difficulty in managing his agitation. Wife reports that he smokes 3 or 4 packs of cigarettes per day and drinks quite a bit of beer per day. Will place a nicotine patch and also provide him with the beer. Explained that with him sleeping currently, typically he will sleep during the day and then be quite agitated at night. Explained this is difficult to manage. He will not allow [place] to replace an IV and will not take oral medications. Will check later on today regarding his incisions. No evidence of complications from his femoral-popliteal bypass currently.

## 2014-08-21 NOTE — Progress Notes (Signed)
Pt agitated and refusing to wear telemetry.  Pt bleeding from IV site where he had removed IV.  Pt did not want to be touched or allowed to place bandage on site.  Pt has safety sitter at bedside and refusing to be assessed.  Patient agitated and attempting to leave.  Security called and pt remained on Media planner with Customer service manager.  Security officer assisted in escorting pt back to room.   Called Dr. Donnetta Hutching and updated.  Called patients wife and she will come to hospital with son. Pt resting with call bell within reach.  Will continue to monitor. Payton Emerald, RN

## 2014-08-21 NOTE — Progress Notes (Signed)
STAFF IN HUDDLE FOR SHIFT CHANGE. SITTER CALLED OUT STATING THAT PATIENT HAD REMOVED TELE AND IV.  PATIENT RAMBLING AND NON-COMPLIANT. REFUSING TO TAKE ANY MEDS OR RECEIVE TREATMENT.  CCMD NOTIFIED OF PATIENT'S REFUSAL TO WEAR TELE.

## 2014-08-21 NOTE — Progress Notes (Signed)
REFUSED VITAL SIGNS

## 2014-08-21 NOTE — Progress Notes (Signed)
Pt/family given discharge instructions, medication lists, follow up appointments, and when to call the doctor.  Pt/family verbalizes understanding. Pt given signs and symptoms of infection. Jones Viviani McClintock, RN    

## 2014-08-21 NOTE — Progress Notes (Signed)
DR. Trula Slade NOTIFIED OF PATIENT'S INCREASED AGITATION, REFUSAL TO WEAR TELE, AND INCOMPLETE CIWA ORDER.  ATIVAN, IV OR PO PRN, ORDERED, AS WELL AS FOLATE AND THIAMINE.  PATIENT CURRENTLY SITTING IN ROOM WITH SITTER, AFTER TRYING TO LEAVE ON ELEVATOR PRIOR TO CONTACTING PHYSICIAN.

## 2014-08-21 NOTE — Progress Notes (Signed)
Patient ID: Steve Baker, male   DOB: Sep 25, 1940, 74 y.o.   MRN: [id number] The patient is sitting in bed eating lunch. He is dressed. His wife and son are present. He is completely lucid. He was to go home. He has been up walking.  On physical exam his left groin and left above-knee popliteal incision are well-healed. His Steri-Strips are intact. So palpable popliteal graft pulse.  Scans with the patient his wife and son. All are comfortable with him being discharged today. He has completely resolved confused last night and suspect that he would do much better at home. See him back in the office in the 2-3 weeks

## 2014-08-24 ENCOUNTER — Telehealth: Payer: Self-pay | Admitting: Vascular Surgery

## 2014-08-24 LAB — TYPE AND SCREEN
ABO/RH(D): O POS
ANTIBODY SCREEN: NEGATIVE
UNIT DIVISION: 0
Unit division: 0

## 2014-08-24 NOTE — Telephone Encounter (Signed)
Spoke with pts wife to schedule, dpm

## 2014-08-24 NOTE — Telephone Encounter (Signed)
-----   Message from Mena Goes, RN sent at 08/21/2014  1:46 PM EDT ----- Regarding: Schedule   ----- Message -----    From: Ulyses Amor, PA-C    Sent: 08/21/2014   1:00 PM      To: [facility] Charge Pool  F/U in 2 weeks with Dr. Donnetta Hutching s/p LE by-pass surgery

## 2014-08-25 ENCOUNTER — Institutional Professional Consult (permissible substitution): Payer: Medicare Other | Admitting: Cardiovascular Disease

## 2014-08-26 ENCOUNTER — Ambulatory Visit (HOSPITAL_COMMUNITY)
Admission: RE | Admit: 2014-08-26 | Discharge: 2014-08-26 | Disposition: A | Discharge status: Home / Self Care | Payer: Medicare Other | Source: Ambulatory Visit | Attending: Internal Medicine | Admitting: Internal Medicine

## 2014-08-26 DIAGNOSIS — C4490 Unspecified malignant neoplasm of skin, unspecified: Secondary | ICD-10-CM | POA: Insufficient documentation

## 2014-08-26 DIAGNOSIS — D509 Iron deficiency anemia, unspecified: Secondary | ICD-10-CM | POA: Diagnosis not present

## 2014-08-26 DIAGNOSIS — I739 Peripheral vascular disease, unspecified: Secondary | ICD-10-CM | POA: Diagnosis not present

## 2014-08-26 DIAGNOSIS — C61 Malignant neoplasm of prostate: Secondary | ICD-10-CM | POA: Insufficient documentation

## 2014-08-26 DIAGNOSIS — C3491 Malignant neoplasm of unspecified part of right bronchus or lung: Secondary | ICD-10-CM | POA: Diagnosis not present

## 2014-08-26 DIAGNOSIS — Z720 Tobacco use: Secondary | ICD-10-CM

## 2014-08-26 MED ORDER — GADOBENATE DIMEGLUMINE 529 MG/ML IV SOLN
20.0000 mL | Freq: Once | INTRAVENOUS | Status: AC | PRN
  Administered 2014-08-26: 17 mL via INTRAVENOUS

## 2014-08-31 ENCOUNTER — Ambulatory Visit (HOSPITAL_BASED_OUTPATIENT_CLINIC_OR_DEPARTMENT_OTHER): Payer: Medicare Other | Admitting: Physician Assistant

## 2014-08-31 ENCOUNTER — Encounter: Payer: Self-pay | Admitting: Physician Assistant

## 2014-08-31 ENCOUNTER — Other Ambulatory Visit (HOSPITAL_BASED_OUTPATIENT_CLINIC_OR_DEPARTMENT_OTHER): Payer: Medicare Other

## 2014-08-31 ENCOUNTER — Telehealth: Payer: Self-pay | Admitting: Internal Medicine

## 2014-08-31 VITALS — BP 95/62 | HR 76 | Temp 97.8°F | Resp 18 | Ht 68.0 in | Wt 187.6 lb

## 2014-08-31 DIAGNOSIS — Z720 Tobacco use: Secondary | ICD-10-CM | POA: Diagnosis not present

## 2014-08-31 DIAGNOSIS — I739 Peripheral vascular disease, unspecified: Secondary | ICD-10-CM | POA: Diagnosis not present

## 2014-08-31 DIAGNOSIS — C3491 Malignant neoplasm of unspecified part of right bronchus or lung: Secondary | ICD-10-CM

## 2014-08-31 DIAGNOSIS — C3411 Malignant neoplasm of upper lobe, right bronchus or lung: Secondary | ICD-10-CM

## 2014-08-31 DIAGNOSIS — D509 Iron deficiency anemia, unspecified: Secondary | ICD-10-CM | POA: Diagnosis not present

## 2014-08-31 LAB — CBC WITH DIFFERENTIAL/PLATELET
BASO%: 1.7 % (ref 0.0–2.0)
BASOS ABS: 0.1 10*3/uL (ref 0.0–0.1)
EOS%: 3.3 % (ref 0.0–7.0)
Eosinophils Absolute: 0.2 10*3/uL (ref 0.0–0.5)
HCT: 34.7 % — ABNORMAL LOW (ref 38.4–49.9)
HGB: 10.3 g/dL — ABNORMAL LOW (ref 13.0–17.1)
LYMPH#: 0.7 10*3/uL — AB (ref 0.9–3.3)
LYMPH%: 13.8 % — ABNORMAL LOW (ref 14.0–49.0)
MCH: 20.7 pg — AB (ref 27.2–33.4)
MCHC: 29.6 g/dL — ABNORMAL LOW (ref 32.0–36.0)
MCV: 70 fL — ABNORMAL LOW (ref 79.3–98.0)
MONO#: 0.3 10*3/uL (ref 0.1–0.9)
MONO%: 6.5 % (ref 0.0–14.0)
NEUT#: 3.9 10*3/uL (ref 1.5–6.5)
NEUT%: 74.7 % (ref 39.0–75.0)
PLATELETS: 353 10*3/uL (ref 140–400)
RBC: 4.96 10*6/uL (ref 4.20–5.82)
RDW: 27.6 % — ABNORMAL HIGH (ref 11.0–14.6)
WBC: 5.3 10*3/uL (ref 4.0–10.3)

## 2014-08-31 LAB — COMPREHENSIVE METABOLIC PANEL (CC13)
ALK PHOS: 77 U/L (ref 40–150)
ALT: 14 U/L (ref 0–55)
AST: 34 U/L (ref 5–34)
Albumin: 3.6 g/dL (ref 3.5–5.0)
Anion Gap: 9 mEq/L (ref 3–11)
BILIRUBIN TOTAL: 0.48 mg/dL (ref 0.20–1.20)
BUN: 18.4 mg/dL (ref 7.0–26.0)
CO2: 27 mEq/L (ref 22–29)
Calcium: 9.1 mg/dL (ref 8.4–10.4)
Chloride: 102 mEq/L (ref 98–109)
Creatinine: 1.1 mg/dL (ref 0.7–1.3)
EGFR: 68 mL/min/{1.73_m2} — AB (ref >90)
GLUCOSE: 152 mg/dL — AB (ref 70–140)
Potassium: 4.7 mEq/L (ref 3.5–5.1)
Sodium: 138 mEq/L (ref 136–145)
TOTAL PROTEIN: 6.7 g/dL (ref 6.4–8.3)

## 2014-08-31 MED ORDER — CYANOCOBALAMIN 1000 MCG/ML IJ SOLN
INTRAMUSCULAR | Status: AC
  Filled 2014-08-31: qty 1

## 2014-08-31 MED ORDER — CYANOCOBALAMIN 1000 MCG/ML IJ SOLN
1000.0000 ug | Freq: Once | INTRAMUSCULAR | Status: AC
  Administered 2014-08-31: 1000 ug via INTRAMUSCULAR

## 2014-08-31 NOTE — Progress Notes (Addendum)
No images are attached to the encounter. No scans are attached to the encounter. No scans are attached to the encounter. Steve Baker  Leonard Downing, MD [place] [id number]  DIAGNOSIS: Adenocarcinoma of right lung   Staging form: Lung, AJCC 7th Edition     Clinical stage from 08/13/2014: Stage IV (T2a, N2, M1a) - Signed by Curt Bears, MD on 08/15/2014  PRIOR THERAPY: None  CURRENT THERAPY: None  INTERVAL HISTORY: Steve Baker 74 y.o. male returns for a scheduled regular office visit for followup of his recently diagnosed non-small cell lung cancer likely stage IV disease. He recently had an MRI of the brain on 08/26/2014 and presents to discuss the results. He voices no specific complaints today except for some fatigue in his baseline shortness of breath with exertion. He denied fever, chills, cough or  hemoptysis. He's had no nausea, vomiting, diarrhea or constipation. He presents today to discuss treatment options.   MEDICAL HISTORY: Past Medical History  Diagnosis Date  . Hyperlipidemia   . Irregular heart beat     Pt stated his heart skips a beat every now and then  . Shortness of breath dyspnea     with ambulation  . Peripheral vascular disease   . Anxiety   . Cancer     prostate; seed insertion   2001  . Coronary artery disease   . Hypertension     lisinopril  . Dysrhythmia   . COPD (chronic obstructive pulmonary disease)   . History of bronchitis   . Anemia     ALLERGIES:  has No Known Allergies.  MEDICATIONS:  Current Outpatient Prescriptions  Medication Sig Dispense Refill  . aspirin EC 81 MG tablet Take 81 mg by mouth daily.    Marland Kitchen FeFum-FePoly-FA-B Cmp-C-Biot (INTEGRA PLUS) CAPS Take 1 capsule by mouth every morning. 30 capsule 2  . furosemide (LASIX) 40 MG tablet Take 40 mg by mouth daily as needed for edema.   0  . lisinopril (PRINIVIL,ZESTRIL) 20 MG tablet Take 20 mg by mouth daily.    .  pravastatin (PRAVACHOL) 40 MG tablet Take 40 mg by mouth daily.    . Tiotropium Bromide Monohydrate (SPIRIVA RESPIMAT) 2.5 MCG/ACT AERS Inhale 2 puffs into the lungs daily. 1 Inhaler 4  . oxyCODONE-acetaminophen (PERCOCET/ROXICET) 5-325 MG per tablet Take 1-2 tablets by mouth every 6 (six) hours as needed for moderate pain. (Patient not taking: Reported on 08/31/2014) 30 tablet 0   No current facility-administered medications for this visit.    SURGICAL HISTORY:  Past Surgical History  Procedure Laterality Date  . Cholecystectomy    . Heart bypass    . Cardiac catheterization  2001  . Skin cancer excision N/A     Nose  . Abdominal aortic aneurysm repair    . Eye surgery Bilateral     cataract removal  . Colonoscopy w/ polypectomy    . Cardiac catheterization N/A 07/16/2014    Procedure: Right/Left Heart Cath and Coronary Angiography;  Surgeon: Burnell Blanks, MD;  Location: [facility];  Service: Cardiovascular;  Laterality: N/A;  . Coronary artery bypass graft    . Video bronchoscopy with endobronchial navigation N/A 07/29/2014    Procedure: VIDEO BRONCHOSCOPY WITH ENDOBRONCHIAL NAVIGATION;  Surgeon: Collene Gobble, MD;  Location: [facility];  Service: Thoracic;  Laterality: N/A;  . Peripheral vascular catheterization N/A 08/05/2014    Procedure: Abdominal Aortogram w/Lower Extremity;  Surgeon: Wellington Hampshire,  MD;  Location: [facility];  Service: Cardiovascular;  Laterality: N/A;  . Endarterectomy femoral Left 08/19/2014    Procedure: PROFUNDA FEMORIS ENDARTERECTOMY ;  Surgeon: Rosetta Posner, MD;  Location: [place];  Service: Vascular;  Laterality: Left;  . Femoral-popliteal bypass graft Left 08/19/2014    Procedure: FEMORAL-POPLITEAL ARTERY BYPASS GRAFT WITH PROPATEN GORTEX ;  Surgeon: Rosetta Posner, MD;  Location: [place];  Service: Vascular;  Laterality: Left;  . Patch angioplasty Left 08/19/2014    Procedure: LEFT PROFUNDA FEMORIS PATCH ANGIOPLASTY USING HEMASHIELD PLATINUM  FINESSE PATCH;  Surgeon: Rosetta Posner, MD;  Location: [facility];  Service: Vascular;  Laterality: Left;    REVIEW OF SYSTEMS:  Review of Systems  Constitutional: Positive for malaise/fatigue. Negative for fever, chills, weight loss and diaphoresis.  HENT: Negative for congestion, ear discharge, ear pain, hearing loss, nosebleeds, sore throat and tinnitus.   Eyes: Negative for blurred vision, double vision, photophobia, pain, discharge and redness.  Respiratory: Positive for shortness of breath. Negative for cough, hemoptysis, sputum production, wheezing and stridor.   Cardiovascular: Negative for chest pain, palpitations, orthopnea, claudication, leg swelling and PND.  Gastrointestinal: Negative for heartburn, nausea, vomiting, abdominal pain, diarrhea, constipation, blood in stool and melena.  Genitourinary: Negative.   Musculoskeletal: Negative.   Skin: Negative.   Neurological: Negative for dizziness, tingling, focal weakness, seizures, weakness and headaches.  Endo/Heme/Allergies: Does not bruise/bleed easily.  Psychiatric/Behavioral: Negative for depression. The patient is not nervous/anxious and does not have insomnia.      PHYSICAL EXAMINATION: Physical Exam  Constitutional: He is oriented to person, place, and time and well-developed, well-nourished, and in no distress.  HENT:  Head: Normocephalic and atraumatic.  Mouth/Throat: Oropharynx is clear and moist.  Eyes: Pupils are equal, round, and reactive to light.  Neck: Normal range of motion. Neck supple. No JVD present. No tracheal deviation present. No thyromegaly present.  Cardiovascular: Normal rate, regular rhythm, normal heart sounds and intact distal pulses.  Exam reveals no gallop and no friction rub.   No murmur heard. Pulmonary/Chest: Effort normal and breath sounds normal. No respiratory distress. He has no wheezes. He has no rales.  Abdominal: Soft. Bowel sounds are normal. He exhibits no distension and no mass. There is  no tenderness.  Musculoskeletal: Normal range of motion. He exhibits no edema or tenderness.  Lymphadenopathy:    He has no cervical adenopathy.  Neurological: He is alert and oriented to person, place, and time. He has normal reflexes. Gait normal.  Skin: Skin is warm and dry. No rash noted.    ECOG PERFORMANCE STATUS: 1 - Symptomatic but completely ambulatory  Blood pressure 95/62, pulse 76, temperature 97.8 F (36.6 C), temperature source Oral, resp. rate 18, height '5\' 8"'$  (1.727 m), weight 187 lb 9.6 oz (85.095 kg), SpO2 100 %.  LABORATORY DATA: Lab Results  Component Value Date   WBC 5.3 08/31/2014   HGB 10.3* 08/31/2014   HCT 34.7* 08/31/2014   MCV 70.0* 08/31/2014   PLT 353 08/31/2014      Chemistry      Component Value Date/Time   NA 138 08/31/2014 1119   NA 133* 08/21/2014 0312   K 4.7 08/31/2014 1119   K 4.1 08/21/2014 0312   CL 101 08/21/2014 0312   CO2 27 08/31/2014 1119   CO2 25 08/21/2014 0312   BUN 18.4 08/31/2014 1119   BUN 8 08/21/2014 0312   CREATININE 1.1 08/31/2014 1119   CREATININE 0.96 08/21/2014 6962  Component Value Date/Time   CALCIUM 9.1 08/31/2014 1119   CALCIUM 8.5* 08/21/2014 0312   ALKPHOS 77 08/31/2014 1119   ALKPHOS 82 08/18/2014 0945   AST 34 08/31/2014 1119   AST 29 08/18/2014 0945   ALT 14 08/31/2014 1119   ALT 9* 08/18/2014 0945   BILITOT 0.48 08/31/2014 1119   BILITOT 0.4 08/18/2014 0945       RADIOGRAPHIC STUDIES:  Mr Jeri Cos Wo Contrast  08/26/2014   CLINICAL DATA:  Adenocarcinoma of the right lung. Skin and prostate cancer. Peripheral artery disease. Iron deficiency anemia.  EXAM: MRI HEAD WITHOUT AND WITH CONTRAST  TECHNIQUE: Multiplanar, multiecho pulse sequences of the brain and surrounding structures were obtained without and with intravenous contrast.  CONTRAST:  31m MULTIHANCE GADOBENATE DIMEGLUMINE 529 MG/ML IV SOLN  COMPARISON:  None available.  FINDINGS: In 6 mm focus restricted diffusion is present in the  posterior medial left parietal lobe. Scattered periventricular and subcortical T2 hyperintensities are evident bilaterally.  Postcontrast images demonstrate a punctate focus of enhancement in the left parietal lobe approximate 1.0 cm above the area of restricted diffusion. No other definite enhancement is present.  Flow is present in the major intracranial arteries. Bilateral lens extractions are present. Globes and orbits are otherwise intact. Mild mucosal thickening is present anteriorly in the right maxillary sinus. The paranasal sinuses are otherwise clear. There is some fluid in the mastoid air cells, left greater than right. No obstructing nasopharyngeal lesion is present.  IMPRESSION: 1. Punctate area of enhancement along the gray-white junction in the left parietal lobe is concerning for a focal metastasis. 2. 6 mm focus of restricted diffusion approximately 1 cm below the focus of enhancement. This may represent an acute ischemic lesion. An additional metastasis is considered less likely without associated enhancement. 3. Left greater than right mastoid effusion. No obstructing nasopharyngeal lesion is evident.   Electronically Signed   By: CSan MorelleM.D.   On: 08/26/2014 16:56     ASSESSMENT/PLAN:  No problem-specific assessment & plan notes found for this encounter.  patient is a 74year old Caucasian male recently diagnosed with stage IV non-small cell lung cancer. The recent MRI of his brain reveals a punctate area of enhancement along the gray-white junction in the left parietal lobe is concerning for a focal metastasis. Patient was discussed with and also seen by Dr. MJulien Nordmann We will consult with Dr. KSondra Comeas to what if anything needs to be done with this punctate area in the brain at this time. We anticipate that the patient will need systemic chemotherapy and that would be in the form of carboplatin for an AUC of 5 and Alimta at 500 mg/m given every 3 weeks. We will hold off on  proceeding with first cycle of chemotherapy until we hear back from Dr. KSondra Come However in anticipation of proceeding with chemotherapy he will be given a B-12 injection today. Should systemic chemotherapy B the route as opposed to concurrent chemoradiation prescriptions for dexamethasone, folic acid and Compazine will be sent to the patient's pharmacy of record which is [facility] in [place]  JCarlton Adam PA-C 08/31/2014   All questions were answered. The patient knows to call the clinic with any problems, questions or concerns. We can certainly see the patient much sooner if necessary.  ADDENDUM: Hematology/Oncology Attending: I had a face to face encounter with the patient. I recommended his care plan. This is a very pleasant 74years old white male with questionable stage IV non-small cell lung  cancer, adenocarcinoma. The patient had a recent MRI of the brain that showed suspicious punctate area of enhancement along the gray-white junction of the left parietal lobe concerning for focal metastasis. The initial plan was to consider the patient for a course of concurrent chemoradiation but now with the new findings on the brain the patient may benefit from systemic chemotherapy with carboplatin for AUC of 5 and Alimta 500 MG/M2 every 3 weeks. We will also refer the patient to Dr. Sondra Come for evaluation and recommendation regarding the suspicious brain lesion. I discussed my recommendation with Dr. Sondra Come before proceeding with the systemic chemotherapy. I will arrange for the patient to receive vitamin B 12 injection. He will also have prescription for Compazine, folic acid and Decadron premedication. He is expected to start the first cycle of this treatment on 09/14/2014. He would come back for follow-up visit in one week off after his treatment for reevaluation and management of any adverse effect of his chemotherapy. The patient was advised to call immediately if he has any concerning  symptoms in the interval.  Disclaimer: This Baker was dictated with voice recognition software. Similar sounding words can inadvertently be transcribed and may be missed upon review. Eilleen Kempf., MD 09/02/2014

## 2014-08-31 NOTE — Telephone Encounter (Signed)
s.w. pt wife and advised on July appts...Marland Kitchenno tx plan in just the pof

## 2014-08-31 NOTE — Patient Instructions (Addendum)
Cyanocobalamin, Vitamin B12 injection What is this medicine? CYANOCOBALAMIN (sye an oh koe BAL a min) is a man made form of vitamin B12. Vitamin B12 is used in the growth of healthy blood cells, nerve cells, and proteins in the body. It also helps with the metabolism of fats and carbohydrates. This medicine is used to treat people who can not absorb vitamin B12. This medicine may be used for other purposes; ask your health care provider or pharmacist if you have questions. COMMON BRAND NAME(S): Cyomin, LA-12, Nutri-Twelve, Primabalt What should I tell my health care provider before I take this medicine? They need to know if you have any of these conditions: -kidney disease -Leber's disease -megaloblastic anemia -an unusual or allergic reaction to cyanocobalamin, cobalt, other medicines, foods, dyes, or preservatives -pregnant or trying to get pregnant -breast-feeding How should I use this medicine? This medicine is injected into a muscle or deeply under the skin. It is usually given by a health care professional in a clinic or doctor's office. However, your doctor may teach you how to inject yourself. Follow all instructions. Talk to your pediatrician regarding the use of this medicine in children. Special care may be needed. Overdosage: If you think you have taken too much of this medicine contact a poison control center or emergency room at once. NOTE: This medicine is only for you. Do not share this medicine with others. What if I miss a dose? If you are given your dose at a clinic or doctor's office, call to reschedule your appointment. If you give your own injections and you miss a dose, take it as soon as you can. If it is almost time for your next dose, take only that dose. Do not take double or extra doses. What may interact with this medicine? -colchicine -heavy alcohol intake This list may not describe all possible interactions. Give your health care provider a list of all the  medicines, herbs, non-prescription drugs, or dietary supplements you use. Also tell them if you smoke, drink alcohol, or use illegal drugs. Some items may interact with your medicine. What should I watch for while using this medicine? Visit your doctor or health care professional regularly. You may need blood work done while you are taking this medicine. You may need to follow a special diet. Talk to your doctor. Limit your alcohol intake and avoid smoking to get the best benefit. What side effects may I notice from receiving this medicine? Side effects that you should report to your doctor or health care professional as soon as possible: -allergic reactions like skin rash, itching or hives, swelling of the face, lips, or tongue -blue tint to skin -chest tightness, pain -difficulty breathing, wheezing -dizziness -red, swollen painful area on the leg Side effects that usually do not require medical attention (report to your doctor or health care professional if they continue or are bothersome): -diarrhea -headache This list may not describe all possible side effects. Call your doctor for medical advice about side effects. You may report side effects to FDA at 1-800-FDA-1088. Where should I keep my medicine? Keep out of the reach of children. Store at room temperature between 15 and 30 degrees C (59 and 85 degrees F). Protect from light. Throw away any unused medicine after the expiration date. NOTE: This sheet is a summary. It may not cover all possible information. If you have questions about this medicine, talk to your doctor, pharmacist, or health care provider.  2015, [employer]vier/Gold Standard. (2007-06-03 22:10:20)    Return in one week to begin systemic chemotherapy and follow-up in 2 weeks for reevaluation. We are discussing your case with Dr. Randa Ngo didn't regarding how to handle the punctate area in your brain.

## 2014-09-01 NOTE — Discharge Summary (Signed)
[facility] Discharge Summary   Patient ID:  Steve Baker MRN: [id number] DOB/AGE: [birth year] 74 y.o.  Admit date: 08/19/2014 Discharge date: 08/21/2014 Date of Surgery: 08/19/2014 Surgeon: Surgeon(s): Rosetta Posner, MD  Admission Diagnosis: Peripheral Vascular Disease with rest pain left leg  I70.222  Discharge Diagnoses:  Peripheral Vascular Disease with rest pain left leg  I70.222  Secondary Diagnoses: Past Medical History  Diagnosis Date  . Hyperlipidemia   . Irregular heart beat     Pt stated his heart skips a beat every now and then  . Shortness of breath dyspnea     with ambulation  . Peripheral vascular disease   . Anxiety   . Cancer     prostate; seed insertion   2001  . Coronary artery disease   . Hypertension     lisinopril  . Dysrhythmia   . COPD (chronic obstructive pulmonary disease)   . History of bronchitis   . Anemia     Procedure(s): PROFUNDA FEMORIS ENDARTERECTOMY  FEMORAL-POPLITEAL ARTERY BYPASS GRAFT WITH PROPATEN GORTEX  LEFT PROFUNDA FEMORIS PATCH ANGIOPLASTY USING HEMASHIELD PLATINUM FINESSE PATCH  Discharged Condition: good  HPI: Steve Baker is a 74 y.o. (1940-11-04) male with a complex medical history who presents with chief complaint: bilateral lower extremity claudication, left much worse than right. He underwent abdominal aortogram with bilateral runoff with Dr. Fletcher Anon today. He previously had an aortobifemoral bypass by Dr. Donnetta Hutching in 2001. He is able to ambulate for 30 ft before having severe claudication in his left calf. This has been going on for several years and has worsened in the past two years. He denies any history of non-healing wounds. The patient reports some rare numbness in his left foot at night but no obvious rest pain.  Dr. Donnetta Hutching reviewed his heart aortogram with bilateral extremity runoff from today and discussed at length with the patient and his wife present he does have occlusion of his left common femoral artery  distal to the prior limb of his aortofemoral bypass. He has a occlusion of his proximal deep femoral artery and occlusion of the superficial femoral artery. He does have reconstitution of the above-knee popliteal artery. There is a significant irregularity and calcification is tibioperoneal trunk on the left but does have patent vessels with 3 vessel runoff. On the right side he has a normal flow into the profunda with SFA occlusion.  Atherosclerotic risk factors include: CAD, hypertension, hyperlipidemia, current smoker.  He was recently diagnosed with pulmonary adenocarcinoma. He will see a thoracic oncologist next Thursday 08/13/14. He has a history of CAD s/p CABG in 2001. He recently underwent cardiac catheterization on 07/16/14 which revealed left ventricle dysfunction and 2 out of 4 patent bypass grafts.   On ROS, he admits to SOB with exertion and sometimes at rest. He denies any chest pain or chest pressure. Full ROS below.    Hospital Course:  Arvel Pinson is a 74 y.o. male is S/P  Procedure(s): PROFUNDA FEMORIS ENDARTERECTOMY  FEMORAL-POPLITEAL ARTERY BYPASS GRAFT WITH PROPATEN GORTEX  LEFT PROFUNDA FEMORIS PATCH ANGIOPLASTY USING HEMASHIELD PLATINUM FINESSE PATCH  POD#1  Anemia of chronic disease transfuse 1 unit PRBC and redraw labs in the am Incisions healing well will change dressing in am Transfer to 2W later today  POD#2 The patient removed IV and refused medications as well as tele. Dr. Donnetta Hutching discussed the episodes with the family and they have stated he drinks alcohol and smokes regularly. We have ordered nicotine patch and beer  q6.. The patient is asleep. We will see again later today.  Dr. Donnetta Hutching spoke with the patients son and wife regarding the difficulty in managing his agitation. Wife reports that he smokes 3 or 4 packs of cigarettes per day and drinks quite a bit of beer per day. Will place a nicotine patch and also provide him with the beer. Explained that with him  sleeping currently, typically he will sleep during the day and then be quite agitated at night. Explained this is difficult to manage. He will not allow [place] to replace an IV and will not take oral medications. Will check later on today regarding his incisions. No evidence of complications from his femoral-popliteal bypass currently.  By 12:52 on 08/21/2014 the patient is sitting in bed eating lunch. He is dressed. His wife and son are present. He is completely lucid. He was to go home. He has been up walking.  He was discharged home in stable condition.  He will f/uin 2-3 weeks.  Significant Diagnostic Studies: CBC Lab Results  Component Value Date   WBC 5.3 08/31/2014   HGB 10.3* 08/31/2014   HCT 34.7* 08/31/2014   MCV 70.0* 08/31/2014   PLT 353 08/31/2014    BMET    Component Value Date/Time   NA 138 08/31/2014 1119   NA 133* 08/21/2014 0312   K 4.7 08/31/2014 1119   K 4.1 08/21/2014 0312   CL 101 08/21/2014 0312   CO2 27 08/31/2014 1119   CO2 25 08/21/2014 0312   GLUCOSE 152* 08/31/2014 1119   GLUCOSE 107* 08/21/2014 0312   BUN 18.4 08/31/2014 1119   BUN 8 08/21/2014 0312   CREATININE 1.1 08/31/2014 1119   CREATININE 0.96 08/21/2014 0312   CALCIUM 9.1 08/31/2014 1119   CALCIUM 8.5* 08/21/2014 0312   GFRNONAA >60 08/21/2014 0312   GFRAA >60 08/21/2014 0312   COAG Lab Results  Component Value Date   INR 1.09 08/18/2014   INR 1.0 07/28/2014   INR 1.1* 07/10/2014     Disposition:  Discharge to :Home Discharge Instructions    Call MD for:  redness, tenderness, or signs of infection (pain, swelling, bleeding, redness, odor or green/yellow discharge around incision site)    Complete by:  As directed      Call MD for:  severe or increased pain, loss or decreased feeling  in affected limb(s)    Complete by:  As directed      Call MD for:  temperature >100.5    Complete by:  As directed      Discharge instructions    Complete by:  As directed   You may shower.      Driving Restrictions    Complete by:  As directed   No driving while taking narcotic pain medication     Increase activity slowly    Complete by:  As directed   Walk with assistance use walker or cane as needed     Lifting restrictions    Complete by:  As directed   No lifting for 6 weeks     Resume previous diet    Complete by:  As directed             Medication List    TAKE these medications        aspirin EC 81 MG tablet  Take 81 mg by mouth daily.     furosemide 40 MG tablet  Commonly known as:  LASIX  Take 40 mg by mouth daily  as needed for edema.     INTEGRA PLUS Caps  Take 1 capsule by mouth every morning.     lisinopril 20 MG tablet  Commonly known as:  PRINIVIL,ZESTRIL  Take 20 mg by mouth daily.     oxyCODONE-acetaminophen 5-325 MG per tablet  Commonly known as:  PERCOCET/ROXICET  Take 1-2 tablets by mouth every 6 (six) hours as needed for moderate pain.     pravastatin 40 MG tablet  Commonly known as:  PRAVACHOL  Take 40 mg by mouth daily.     Tiotropium Bromide Monohydrate 2.5 MCG/ACT Aers  Commonly known as:  SPIRIVA RESPIMAT  Inhale 2 puffs into the lungs daily.       Verbal and written Discharge instructions given to the patient. Wound care per Discharge AVS     Follow-up Information    Follow up with Curt Jews, MD In 2 weeks.   Specialties:  Vascular Surgery, Cardiology   Why:  sent message to office   Contact information:   [address] [place] [address] [phone]       Signed: Roxy Horseman 09/01/2014, 3:10 PM  - For [facility] Registry use --- Instructions: Press F2 to tab through selections.  Delete question if not applicable.   Post-op:  Wound infection: No  Graft infection: No  Transfusion: Yes  If yes, 1 units given New Arrhythmia: No Ipsilateral amputation: '[ ]'$  no, '[ ]'$  Minor, '[ ]'$  BKA, '[ ]'$  AKA Discharge patency: '[ ]'$  Primary, '[ ]'$  Primary assisted, '[ ]'$  Secondary, '[ ]'$  Occluded Patency judged by: '[ ]'$  Dopper only,  '[ ]'$  Palpable graft pulse, '[ ]'$  Palpable distal pulse, '[ ]'$  ABI inc. > 0.15, '[ ]'$  Duplex Discharge ABI: R biphasic flow ant tib 0.75, PT 0.67, left Biphasic flow AT 0.69, PT 0.79  D/C Ambulatory Status: Ambulatory  Complications: MI: '[ ]'$  No, '[ ]'$  Troponin only, '[ ]'$  EKG or Clinical CHF: No Resp failure: [x ] none, '[ ]'$  Pneumonia, '[ ]'$  Ventilator Chg in renal function: [x ] none, '[ ]'$  Inc. Cr > 0.5, '[ ]'$  Temp. Dialysis, '[ ]'$  Permanent dialysis Stroke: [ x] None, '[ ]'$  Minor, '[ ]'$  Major Return to OR: No  Reason for return to OR: '[ ]'$  Bleeding, '[ ]'$  Infection, '[ ]'$  Thrombosis, '[ ]'$  Revision  Discharge medications: Statin use:  Yes ASA use:  Yes Plavix use:  No  for medical reason   Beta blocker use: No  for medical reason   Coumadin use: No  for medical reason

## 2014-09-02 ENCOUNTER — Encounter: Payer: Self-pay | Admitting: Vascular Surgery

## 2014-09-08 ENCOUNTER — Encounter: Payer: Self-pay | Admitting: Vascular Surgery

## 2014-09-08 ENCOUNTER — Ambulatory Visit (INDEPENDENT_AMBULATORY_CARE_PROVIDER_SITE_OTHER): Payer: Self-pay | Admitting: Vascular Surgery

## 2014-09-08 VITALS — BP 122/65 | HR 82 | Temp 98.5°F | Resp 18 | Ht 68.5 in | Wt 184.9 lb

## 2014-09-08 DIAGNOSIS — Z48812 Encounter for surgical aftercare following surgery on the circulatory system: Secondary | ICD-10-CM

## 2014-09-08 DIAGNOSIS — I739 Peripheral vascular disease, unspecified: Secondary | ICD-10-CM

## 2014-09-08 NOTE — Progress Notes (Signed)
  POST OPERATIVE OFFICE NOTE    CC:  F/u for surgery  HPI:  This is a 74 y.o. male who is s/p Left femoral to above knee popliteal bypass with Gore-tex graft.  He has moderate swelling in the left foot and ankle.  He states the pain he had prior to surgery is better.  He reports no fever , chills V/N.    No Known Allergies  Current Outpatient Prescriptions  Medication Sig Dispense Refill  . aspirin EC 81 MG tablet Take 81 mg by mouth daily.    Marland Kitchen FeFum-FePoly-FA-B Cmp-C-Biot (INTEGRA PLUS) CAPS Take 1 capsule by mouth every morning. 30 capsule 2  . furosemide (LASIX) 40 MG tablet Take 40 mg by mouth daily as needed for edema.   0  . lisinopril (PRINIVIL,ZESTRIL) 20 MG tablet Take 20 mg by mouth daily.    . pravastatin (PRAVACHOL) 40 MG tablet Take 40 mg by mouth daily.    . Tiotropium Bromide Monohydrate (SPIRIVA RESPIMAT) 2.5 MCG/ACT AERS Inhale 2 puffs into the lungs daily. 1 Inhaler 4  . oxyCODONE-acetaminophen (PERCOCET/ROXICET) 5-325 MG per tablet Take 1-2 tablets by mouth every 6 (six) hours as needed for moderate pain. (Patient not taking: Reported on 08/31/2014) 30 tablet 0   No current facility-administered medications for this visit.     ROS:  See HPI  Physical Exam:  Filed Vitals:   09/08/14 1543  BP: 122/65  Pulse: 82  Temp: 98.5 F (36.9 C)  Resp: 18    Incision:  Well healed Extremities:  Moderate edema left LE as well as chronic right LE pitting edema.   Neuro: Palpable popliteal pulse, 2+ biphasic doppler DP/PT signals  Assessment/Plan:  This is a 74 y.o. male who is s/p:  Left femoral to above knee popliteal bypass with Gore-tex graft.  I demonstrated elevation in the supine position with the left foot above the level of his heart for the edema.  He does take lasix daily for chronic edema and will continue to do so.    We will see him back in 6 months with a repeat arterial left LE duplex and ABI's.  If he has problems or concerns prior to his F/U he will  call us.  Theda Sers, EMMA MAUREEN PA-C [facility]   Clinic MD:  Pt seen and examined with Dr. Donnetta Hutching   I have examined the patient, reviewed and agree with above. Well-healed groin and popliteal incisions. 2+ popliteal pulse. Multiphasic lower posterior tibial and dorsalis pedis. Has had complete resolution of preoperative intolerable claudication and rest pain symptoms We'll see her again in 6 months for continued follow-up  Curt Jews, MD 09/08/2014 4:24 PM

## 2014-09-09 ENCOUNTER — Encounter: Payer: Self-pay | Admitting: Radiation Oncology

## 2014-09-09 ENCOUNTER — Other Ambulatory Visit: Payer: Self-pay | Admitting: Radiation Therapy

## 2014-09-09 DIAGNOSIS — C7931 Secondary malignant neoplasm of brain: Secondary | ICD-10-CM

## 2014-09-09 NOTE — Progress Notes (Signed)
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 MRI followed as work up  Past or anticipated interventions, if any, per neurosurgery: no, patient likely stage IV disease  Past or anticipated interventions, if any, per medical oncology: recommending systemic chemotherapy with carboplatin for AUC of 5 and Alimta 500 mg/m2 every three weeks to start 09/14/2014  Dose of Decadron, if applicable: decadron prescribed as premedication along with folic acid and compazine  Recent neurologic symptoms, if any:   Seizures: NO  Headaches: NO  Nausea: NO  Dizziness/ataxia: NO   Difficulty with hand coordination: NO   Focal numbness/weakness: NO  Visual deficits/changes: NO  Confusion/Memory deficits: NO   Painful bone metastases at present, if any: no  SAFETY ISSUES:  Prior radiation? yes, prostate seeds. Where? When?  Pacemaker/ICD? no  Possible current pregnancy? no  Is the patient on methotrexate? no  Additional Complaints / other details: 74 year old male. Shortness of breath; negative for cough, hemoptysis, sputum production, wheezing or stridor. Reports fatigue. Course has been complicated by peripheral vascular disease and need for profunda femoris endarterectomy, femoral popliteal artery bypass graft with propaten gortex, and left profunda femoris patch angioplasty using hemashield platinum finesse patch.

## 2014-09-09 NOTE — Addendum Note (Signed)
Addended by: Dorthula Rue L on: 09/09/2014 04:03 PM   Modules accepted: Orders

## 2014-09-10 ENCOUNTER — Ambulatory Visit
Admission: RE | Admit: 2014-09-10 | Discharge: 2014-09-10 | Disposition: A | Discharge status: Home / Self Care | Payer: Medicare Other | Source: Ambulatory Visit | Attending: Radiation Oncology | Admitting: Radiation Oncology

## 2014-09-10 ENCOUNTER — Other Ambulatory Visit: Payer: Self-pay | Admitting: *Deleted

## 2014-09-10 ENCOUNTER — Telehealth: Payer: Self-pay | Admitting: Internal Medicine

## 2014-09-10 ENCOUNTER — Encounter: Payer: Self-pay | Admitting: *Deleted

## 2014-09-10 ENCOUNTER — Encounter: Payer: Self-pay | Admitting: Radiation Oncology

## 2014-09-10 VITALS — BP 128/69 | HR 64 | Resp 16 | Ht 68.5 in | Wt 184.3 lb

## 2014-09-10 DIAGNOSIS — C7931 Secondary malignant neoplasm of brain: Secondary | ICD-10-CM | POA: Diagnosis present

## 2014-09-10 DIAGNOSIS — C3491 Malignant neoplasm of unspecified part of right bronchus or lung: Secondary | ICD-10-CM

## 2014-09-10 DIAGNOSIS — C3411 Malignant neoplasm of upper lobe, right bronchus or lung: Secondary | ICD-10-CM | POA: Diagnosis present

## 2014-09-10 DIAGNOSIS — Z510 Encounter for antineoplastic radiation therapy: Secondary | ICD-10-CM | POA: Diagnosis present

## 2014-09-10 MED ORDER — LORAZEPAM 1 MG PO TABS: 1.0000 mg | ORAL_TABLET | Freq: Three times a day (TID) | ORAL | Status: DC

## 2014-09-10 NOTE — Progress Notes (Signed)
Vitals WDL. Wed for 47 years. Reports intermittent dizziness. Reports a decline of short term memory. Reports yesterday he spent most of the day having a new key made for her car when it was in his pocket the entire time. Reports he had prostate radiation at [facility] then, seeds 15 years ago. Bilateral lower leg edema noted worse in the left. Denies pain but, does reports soreness at left femoral angioplasty site. Persistent cough noted with clear sputum. Denies hemoptysis. Reports SOB with exertion. Denies difficulty swallowing. Denies seizures, headaches, nausea, weakness, or visual deficits.   BP 128/69 mmHg  Pulse 64  Resp 16  Ht 5' 8.5" (1.74 m)  Wt 184 lb 4.8 oz (83.598 kg)  BMI 27.61 kg/m2  SpO2 100% Wt Readings from Last 3 Encounters:  09/10/14 184 lb 4.8 oz (83.598 kg)  09/08/14 184 lb 14.4 oz (83.87 kg)  08/31/14 187 lb 9.6 oz (85.095 kg)

## 2014-09-10 NOTE — Telephone Encounter (Signed)
lvm for pt regarding to July appt. °

## 2014-09-10 NOTE — Progress Notes (Signed)
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  Interval Since Last Radiation:  N/A  Narrative:  Mr. Ortega is a very nice 74 year old gentleman who has an 84 year pack history of smoking. His primary care physian, Dr. Arelia Sneddon ordered a screening chest CT. 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 He was being set up to receive concurrent chemotherapy with radiation by Dr. Sondra Come. Staging brian MRI was performed on 08/26/2014. This study demonstrated a punctate focus of enhancement in the gray-white junction in the left parietal lobe concerning for metastasis. He has kindly been referred to today for possible radiation options.  The patient reports intermittent dizziness, persistent cough noted with clear sputum, and SOB with exertion. He currently denies pain but, does reports soreness at left femoral angioplasty site. Bilateral lower leg edema noted worse in the left.   He additionally denies hemoptysis, difficulty swallowing, seizures, headaches, nausea, weakness, or visual deficits. Vandehei and his wife report a decline of short term memory. The patient underwent prostate radiation at [facility] followed by a seed implantation procedure, around 15 years ago. Mccort vocalized that he has extreme anxiety associated to the "loud" MRI machine.                 ALLERGIES:  has No Known Allergies.  Meds: Current Outpatient Prescriptions  Medication Sig Dispense Refill  . aspirin EC 81 MG tablet Take 81 mg by mouth daily.    Marland Kitchen FeFum-FePoly-FA-B Cmp-C-Biot (INTEGRA PLUS) CAPS Take 1 capsule by mouth every morning. 30 capsule 2  . furosemide (LASIX) 40 MG tablet Take 40 mg by  mouth daily as needed for edema.   0  . lisinopril (PRINIVIL,ZESTRIL) 20 MG tablet Take 20 mg by mouth daily.    . pravastatin (PRAVACHOL) 40 MG tablet Take 40 mg by mouth daily.    . Tiotropium Bromide Monohydrate (SPIRIVA RESPIMAT) 2.5 MCG/ACT AERS Inhale 2 puffs into the lungs daily. 1 Inhaler 4  . oxyCODONE-acetaminophen (PERCOCET/ROXICET) 5-325 MG per tablet Take 1-2 tablets by mouth every 6 (six) hours as needed for moderate pain. (Patient not taking: Reported on 08/31/2014) 30 tablet 0   No current facility-administered medications for this encounter.    Physical Findings: The patient is in no acute distress. Patient is alert and oriented x3.  height is 5' 8.5" (1.74 m) and weight is 184 lb 4.8 oz (83.598 kg). His blood pressure is 128/69 and his pulse is 64. His respiration is 16 and oxygen saturation is 100%. . No significant changes are currently noted in regards to the patient's overall health status.  Neuro grossly intact.   Lab Findings: Lab Results  Component Value Date   WBC 5.3 08/31/2014   WBC 8.0 08/21/2014   HGB 10.3* 08/31/2014   HGB 8.2* 08/21/2014   HCT 34.7* 08/31/2014   HCT 28.7* 08/21/2014   PLT 353 08/31/2014   PLT 227 08/21/2014    Lab Results  Component Value Date   NA 138 08/31/2014   NA 133* 08/21/2014   K 4.7 08/31/2014   K 4.1 08/21/2014   CHLORIDE 102 08/31/2014   CO2 27 08/31/2014   CO2 25 08/21/2014   GLUCOSE  152* 08/31/2014   GLUCOSE 107* 08/21/2014   BUN 18.4 08/31/2014   BUN 8 08/21/2014   CREATININE 1.1 08/31/2014   CREATININE 0.96 08/21/2014   BILITOT 0.48 08/31/2014   BILITOT 0.4 08/18/2014   ALKPHOS 77 08/31/2014   ALKPHOS 82 08/18/2014   AST 34 08/31/2014   AST 29 08/18/2014   ALT 14 08/31/2014   ALT 9* 08/18/2014   PROT 6.7 08/31/2014   PROT 6.8 08/18/2014   ALBUMIN 3.6 08/31/2014   ALBUMIN 3.6 08/18/2014   CALCIUM 9.1 08/31/2014   CALCIUM 8.5* 08/21/2014   ANIONGAP 9 08/31/2014   ANIONGAP 7 08/21/2014     Radiographic Findings: Mr Jeri Cos Wo Contrast  08/26/2014   CLINICAL DATA:  Adenocarcinoma of the right lung. Skin and prostate cancer. Peripheral artery disease. Iron deficiency anemia.  EXAM: MRI HEAD WITHOUT AND WITH CONTRAST  TECHNIQUE: Multiplanar, multiecho pulse sequences of the brain and surrounding structures were obtained without and with intravenous contrast.  CONTRAST:  73m MULTIHANCE GADOBENATE DIMEGLUMINE 529 MG/ML IV SOLN  COMPARISON:  None available.  FINDINGS: In 6 mm focus restricted diffusion is present in the posterior medial left parietal lobe. Scattered periventricular and subcortical T2 hyperintensities are evident bilaterally.  Postcontrast images demonstrate a punctate focus of enhancement in the left parietal lobe approximate 1.0 cm above the area of restricted diffusion. No other definite enhancement is present.  Flow is present in the major intracranial arteries. Bilateral lens extractions are present. Globes and orbits are otherwise intact. Mild mucosal thickening is present anteriorly in the right maxillary sinus. The paranasal sinuses are otherwise clear. There is some fluid in the mastoid air cells, left greater than right. No obstructing nasopharyngeal lesion is present.  IMPRESSION: 1. Punctate area of enhancement along the gray-white junction in the left parietal lobe is concerning for a focal metastasis. 2. 6 mm focus of restricted diffusion approximately 1 cm below the focus of enhancement. This may represent an acute ischemic lesion. An additional metastasis is considered less likely without associated enhancement. 3. Left greater than right mastoid effusion. No obstructing nasopharyngeal lesion is evident.   Electronically Signed   By: CSan MorelleM.D.   On: 08/26/2014 16:56    Impression: AMouhamad Teedis a 74year old gentleman presenting to clinic in regards to his solitary left portial punctate brain metastasis. The patient is recovering from the effects of  radiation. At this point, the patient would potentially benefit from radiotherapy. The options include whole brain irradiation versus stereotactic radiosurgery. There are pros and cons associated with each of these potential treatment options. Whole brain radiotherapy would treat the known metastatic deposits and help provide some reduction of risk for future brain metastases. However, whole brain radiotherapy carries potential risks including hair loss, subacute somnolence, and neurocognitive changes including a possible reduction in short-term memory. Whole brain radiotherapy also may carry a lower likelihood of tumor control at the treatment sites because of the low-dose used. Stereotactic radiosurgery carries a higher likelihood for local tumor control at the targeted sites with lower associated risk for neurocognitive changes such as memory loss. However, the use of stereotactic radiosurgery in this setting may leave the patient at increased risk for new brain metastases elsewhere in the brain as high as 50-60%. Accordingly, patients who receive stereotactic radiosurgery in this setting should undergo ongoing surveillance imaging with brain MRI more frequently in order to identify and treat new small brain metastases before they become symptomatic. Stereotactic radiosurgery does carry some different risks,  including a risk of radionecrosis.  PLAN: Today, I reviewed the findings and workup thus far with the patient. We discussed the dilemma regarding whole brain radiotherapy versus stereotactic radiosurgery. We discussed the pros and cons of each. We also discussed the logistics and delivery of each. We reviewed the results associated with each of the treatments described above. The patient seems to understand the treatment options and would like to proceed with stereotactic radiosurgery. Tomaso and his wife were made aware of MRI scan, planning appointment to create thermoplastic mask, CT scan, neurosurgeon  consult, and follow up with radiation oncology appointments to be scheduled. Millirons vocalized that he has extreme anxiety associated to the "loud" MRI machine. Possible medications to assist in alleviating this anxiety was reviewed and discussed.  I spent 60 minutes minutes face to face with the patient and more than 50% of that time was spent in counseling and/or coordination of care.   This document serves as a record of services personally performed by Tyler Pita, MD. It was created on his behalf by Lenn Cal, a trained medical scribe. The creation of this record is based on the scribe's personal observations and the provider's statements to them. This document has been checked and approved by the attending provider.  _____________________________________  Sheral Apley. Tammi Klippel, M.D.

## 2014-09-10 NOTE — Progress Notes (Signed)
Oncology Nurse Navigator Documentation  Oncology Nurse Navigator Flowsheets 09/10/2014  Referral date to RadOnc/MedOnc -  Navigator Encounter Type Other/Rad Onc needed labs to be changed.  I completed POF to change labs from 09/24/14 to 09/25/14.    Time Spent with Patient 15

## 2014-09-10 NOTE — Progress Notes (Signed)
See progress note under physician encounter. 

## 2014-09-11 ENCOUNTER — Other Ambulatory Visit: Payer: Self-pay | Admitting: Internal Medicine

## 2014-09-11 ENCOUNTER — Telehealth: Payer: Self-pay | Admitting: Radiation Oncology

## 2014-09-11 ENCOUNTER — Ambulatory Visit (HOSPITAL_BASED_OUTPATIENT_CLINIC_OR_DEPARTMENT_OTHER): Payer: Medicare Other

## 2014-09-11 ENCOUNTER — Other Ambulatory Visit (HOSPITAL_BASED_OUTPATIENT_CLINIC_OR_DEPARTMENT_OTHER): Payer: Medicare Other

## 2014-09-11 VITALS — BP 115/68 | HR 58 | Temp 97.6°F | Resp 18

## 2014-09-11 DIAGNOSIS — C3411 Malignant neoplasm of upper lobe, right bronchus or lung: Secondary | ICD-10-CM

## 2014-09-11 DIAGNOSIS — C3491 Malignant neoplasm of unspecified part of right bronchus or lung: Secondary | ICD-10-CM

## 2014-09-11 DIAGNOSIS — Z5111 Encounter for antineoplastic chemotherapy: Secondary | ICD-10-CM | POA: Diagnosis not present

## 2014-09-11 LAB — COMPREHENSIVE METABOLIC PANEL (CC13)
ALT: 11 U/L (ref 0–55)
ANION GAP: 9 meq/L (ref 3–11)
AST: 34 U/L (ref 5–34)
Albumin: 4 g/dL (ref 3.5–5.0)
Alkaline Phosphatase: 91 U/L (ref 40–150)
BUN: 16.5 mg/dL (ref 7.0–26.0)
CALCIUM: 9.7 mg/dL (ref 8.4–10.4)
CO2: 24 meq/L (ref 22–29)
CREATININE: 0.9 mg/dL (ref 0.7–1.3)
Chloride: 103 mEq/L (ref 98–109)
EGFR: 81 mL/min/{1.73_m2} — AB (ref >90)
GLUCOSE: 84 mg/dL (ref 70–140)
Potassium: 4.4 mEq/L (ref 3.5–5.1)
Sodium: 136 mEq/L (ref 136–145)
Total Bilirubin: 0.55 mg/dL (ref 0.20–1.20)
Total Protein: 7.4 g/dL (ref 6.4–8.3)

## 2014-09-11 LAB — CBC WITH DIFFERENTIAL/PLATELET
BASO%: 0.4 % (ref 0.0–2.0)
Basophils Absolute: 0 10*3/uL (ref 0.0–0.1)
EOS%: 4.3 % (ref 0.0–7.0)
Eosinophils Absolute: 0.2 10*3/uL (ref 0.0–0.5)
HEMATOCRIT: 40.8 % (ref 38.4–49.9)
HGB: 12 g/dL — ABNORMAL LOW (ref 13.0–17.1)
LYMPH#: 1 10*3/uL (ref 0.9–3.3)
LYMPH%: 20.5 % (ref 14.0–49.0)
MCH: 22.9 pg — ABNORMAL LOW (ref 27.2–33.4)
MCHC: 29.4 g/dL — ABNORMAL LOW (ref 32.0–36.0)
MCV: 78 fL — ABNORMAL LOW (ref 79.3–98.0)
MONO#: 0.5 10*3/uL (ref 0.1–0.9)
MONO%: 11.4 % (ref 0.0–14.0)
NEUT#: 2.9 10*3/uL (ref 1.5–6.5)
NEUT%: 63.4 % (ref 39.0–75.0)
Platelets: 288 10*3/uL (ref 140–400)
RBC: 5.23 10*6/uL (ref 4.20–5.82)
RDW: 29.3 % — ABNORMAL HIGH (ref 11.0–14.6)
WBC: 4.6 10*3/uL (ref 4.0–10.3)

## 2014-09-11 MED ORDER — SODIUM CHLORIDE 0.9 % IV SOLN
Freq: Once | INTRAVENOUS | Status: AC
  Administered 2014-09-11: 10:00:00 via INTRAVENOUS

## 2014-09-11 MED ORDER — SODIUM CHLORIDE 0.9 % IV SOLN
500.0000 mg/m2 | Freq: Once | INTRAVENOUS | Status: AC
  Administered 2014-09-11: 1000 mg via INTRAVENOUS
  Filled 2014-09-11: qty 40

## 2014-09-11 MED ORDER — FOLIC ACID 1 MG PO TABS: 1.0000 mg | ORAL_TABLET | Freq: Every day | ORAL | Status: DC

## 2014-09-11 MED ORDER — PROCHLORPERAZINE MALEATE 10 MG PO TABS: 10.0000 mg | ORAL_TABLET | Freq: Four times a day (QID) | ORAL | Status: DC | PRN

## 2014-09-11 MED ORDER — SODIUM CHLORIDE 0.9 % IV SOLN
479.5000 mg | Freq: Once | INTRAVENOUS | Status: AC
  Administered 2014-09-11: 480 mg via INTRAVENOUS
  Filled 2014-09-11: qty 48

## 2014-09-11 MED ORDER — DEXAMETHASONE 4 MG PO TABS: ORAL_TABLET | ORAL | Status: DC

## 2014-09-11 MED ORDER — SODIUM CHLORIDE 0.9 % IV SOLN
Freq: Once | INTRAVENOUS | Status: AC
  Administered 2014-09-11: 11:00:00 via INTRAVENOUS
  Filled 2014-09-11: qty 8

## 2014-09-11 NOTE — Telephone Encounter (Signed)
Per Dr. Johny Shears order called in Ativan. Phoned patient and left message this was done.

## 2014-09-11 NOTE — Progress Notes (Signed)
Patient did not have prescription for 09/10/14 dexamethasone dose. MDaware of dexamethasone missed dose and would like to proceed with chemotherapy. MD Mohamed sent folic acid, dexamethasone, and compazine to pharmacy. Instructed patient to take folic acid daily and dexamethasone today (7/8/716), 09/12/14, and 09/13/14. Chemotherapy education appointment was not made prior to chemotherapy. Educator sent to infusion center for further education on alimta/Carboplatin.

## 2014-09-11 NOTE — Patient Instructions (Signed)
[facility] Discharge Instructions for Patients Receiving Chemotherapy  Today you received the following chemotherapy agents: Alimta/Carboplatin  To help prevent nausea and vomiting after your treatment, we encourage you to take your nausea medication as directed.   If you develop nausea and vomiting that is not controlled by your nausea medication, call the clinic.   BELOW ARE SYMPTOMS THAT SHOULD BE REPORTED IMMEDIATELY:  *FEVER GREATER THAN 100.5 F  *CHILLS WITH OR WITHOUT FEVER  NAUSEA AND VOMITING THAT IS NOT CONTROLLED WITH YOUR NAUSEA MEDICATION  *UNUSUAL SHORTNESS OF BREATH  *UNUSUAL BRUISING OR BLEEDING  TENDERNESS IN MOUTH AND THROAT WITH OR WITHOUT PRESENCE OF ULCERS  *URINARY PROBLEMS  *BOWEL PROBLEMS  UNUSUAL RASH Items with * indicate a potential emergency and should be followed up as soon as possible.  Feel free to call the clinic you have any questions or concerns. The clinic phone number is [phone].  Please show the CHEMO ALERT CARD at check-in to the Emergency Department and triage nurse.   

## 2014-09-14 ENCOUNTER — Telehealth: Payer: Self-pay | Admitting: *Deleted

## 2014-09-14 NOTE — Telephone Encounter (Signed)
Follow up chemo call placed to pt, spoke with wife who advised pt is doing well, denies any concerns at this time.

## 2014-09-14 NOTE — Telephone Encounter (Signed)
-----   Message from Ronnette Juniper, RN sent at 09/11/2014 12:02 PM EDT ----- Regarding: 1st treatment 1st treatment: Alimta/Carboplatin. Steve Baker.   [phone] Jerilynn Mages)

## 2014-09-17 ENCOUNTER — Other Ambulatory Visit: Payer: Self-pay | Admitting: Internal Medicine

## 2014-09-17 ENCOUNTER — Ambulatory Visit (HOSPITAL_BASED_OUTPATIENT_CLINIC_OR_DEPARTMENT_OTHER): Payer: Medicare Other | Admitting: Physician Assistant

## 2014-09-17 ENCOUNTER — Other Ambulatory Visit (HOSPITAL_BASED_OUTPATIENT_CLINIC_OR_DEPARTMENT_OTHER): Payer: Medicare Other

## 2014-09-17 ENCOUNTER — Encounter: Payer: Self-pay | Admitting: Physician Assistant

## 2014-09-17 VITALS — BP 111/55 | HR 69 | Temp 98.2°F | Resp 20 | Ht 68.5 in | Wt 182.3 lb

## 2014-09-17 DIAGNOSIS — C3491 Malignant neoplasm of unspecified part of right bronchus or lung: Secondary | ICD-10-CM

## 2014-09-17 DIAGNOSIS — Z720 Tobacco use: Secondary | ICD-10-CM

## 2014-09-17 DIAGNOSIS — I739 Peripheral vascular disease, unspecified: Secondary | ICD-10-CM

## 2014-09-17 DIAGNOSIS — C3411 Malignant neoplasm of upper lobe, right bronchus or lung: Secondary | ICD-10-CM | POA: Diagnosis not present

## 2014-09-17 DIAGNOSIS — D509 Iron deficiency anemia, unspecified: Secondary | ICD-10-CM

## 2014-09-17 DIAGNOSIS — C7931 Secondary malignant neoplasm of brain: Secondary | ICD-10-CM

## 2014-09-17 LAB — CBC WITH DIFFERENTIAL/PLATELET
BASO%: 0.4 % (ref 0.0–2.0)
Basophils Absolute: 0 10*3/uL (ref 0.0–0.1)
EOS%: 3.1 % (ref 0.0–7.0)
Eosinophils Absolute: 0.1 10*3/uL (ref 0.0–0.5)
HEMATOCRIT: 32.1 % — AB (ref 38.4–49.9)
HGB: 9.6 g/dL — ABNORMAL LOW (ref 13.0–17.1)
LYMPH%: 25.6 % (ref 14.0–49.0)
MCH: 23.3 pg — AB (ref 27.2–33.4)
MCHC: 29.9 g/dL — AB (ref 32.0–36.0)
MCV: 77.9 fL — ABNORMAL LOW (ref 79.3–98.0)
MONO#: 0 10*3/uL — ABNORMAL LOW (ref 0.1–0.9)
MONO%: 1.6 % (ref 0.0–14.0)
NEUT#: 1.8 10*3/uL (ref 1.5–6.5)
NEUT%: 69.3 % (ref 39.0–75.0)
Platelets: 150 10*3/uL (ref 140–400)
RBC: 4.12 10*6/uL — AB (ref 4.20–5.82)
RDW: 28.5 % — AB (ref 11.0–14.6)
WBC: 2.5 10*3/uL — AB (ref 4.0–10.3)
lymph#: 0.7 10*3/uL — ABNORMAL LOW (ref 0.9–3.3)

## 2014-09-17 LAB — COMPREHENSIVE METABOLIC PANEL (CC13)
ALT: 11 U/L (ref 0–55)
ANION GAP: 7 meq/L (ref 3–11)
AST: 28 U/L (ref 5–34)
Albumin: 3.4 g/dL — ABNORMAL LOW (ref 3.5–5.0)
Alkaline Phosphatase: 60 U/L (ref 40–150)
BILIRUBIN TOTAL: 0.44 mg/dL (ref 0.20–1.20)
BUN: 20.6 mg/dL (ref 7.0–26.0)
CO2: 24 mEq/L (ref 22–29)
CREATININE: 1 mg/dL (ref 0.7–1.3)
Calcium: 9 mg/dL (ref 8.4–10.4)
Chloride: 107 mEq/L (ref 98–109)
EGFR: 74 mL/min/{1.73_m2} — AB (ref >90)
Glucose: 108 mg/dl (ref 70–140)
Potassium: 4.1 mEq/L (ref 3.5–5.1)
Sodium: 138 mEq/L (ref 136–145)
TOTAL PROTEIN: 6.1 g/dL — AB (ref 6.4–8.3)

## 2014-09-17 NOTE — Progress Notes (Signed)
No images are attached to the encounter. No scans are attached to the encounter. No scans are attached to the encounter. [place] SHARED VISIT PROGRESS NOTE  Leonard Downing, MD [place] [id number]  DIAGNOSIS: Adenocarcinoma of right lung   Staging form: Lung, AJCC 7th Edition     Clinical stage from 08/13/2014: Stage IV (T2a, N2, M1a) - Signed by Curt Bears, MD on 08/15/2014  PRIOR THERAPY: None  CURRENT THERAPY: Systemic chemotherapy carboplatin for an before meals of 5 and Alimta at 500 mg/m given every 3 weeks. Status post 1 cycle  INTERVAL HISTORY: Steve Baker 74 y.o. male returns for a symptom visit after completing his first cycle of systemic chemotherapy with carboplatin and Alimta. He tolerated his first cycle of chemotherapy without difficulty. He continues to have some mild fatigue in his baseline shortness of breath. He voiced no other specific complaints. Specifically he denied fever, chills, cough or  hemoptysis. He's had no nausea, vomiting, diarrhea or constipation.   MEDICAL HISTORY: Past Medical History  Diagnosis Date  . Hyperlipidemia   . Irregular heart beat     Pt stated his heart skips a beat every now and then  . Shortness of breath dyspnea     with ambulation  . Peripheral vascular disease   . Anxiety   . Coronary artery disease   . Hypertension     lisinopril  . Dysrhythmia   . COPD (chronic obstructive pulmonary disease)   . History of bronchitis   . Anemia   . 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 Brain cancer     brain metastasis  . Lung cancer     adenocarcinoma of right lung    ALLERGIES:  has No Known Allergies.  MEDICATIONS:  Current Outpatient Prescriptions  Medication Sig Dispense Refill  . aspirin EC 81 MG tablet Take 81 mg by mouth daily.    Marland Kitchen dexamethasone (DECADRON) 4 MG tablet 4 mg po bid, the day before, day of and day after chemo 40 tablet 1  . FeFum-FePoly-FA-B Cmp-C-Biot (INTEGRA  PLUS) CAPS Take 1 capsule by mouth every morning. 30 capsule 2  . folic acid (FOLVITE) 1 MG tablet Take 1 tablet (1 mg total) by mouth daily. 30 tablet 4  . furosemide (LASIX) 40 MG tablet Take 40 mg by mouth daily as needed for edema.   0  . lisinopril (PRINIVIL,ZESTRIL) 20 MG tablet Take 20 mg by mouth daily.    Marland Kitchen LORazepam (ATIVAN) 1 MG tablet Take 1 tablet (1 mg total) by mouth every 8 (eight) hours. 30 tablet 0  . oxyCODONE-acetaminophen (PERCOCET/ROXICET) 5-325 MG per tablet Take 1-2 tablets by mouth every 6 (six) hours as needed for moderate pain. (Patient not taking: Reported on 08/31/2014) 30 tablet 0  . pravastatin (PRAVACHOL) 40 MG tablet Take 40 mg by mouth daily.    . prochlorperazine (COMPAZINE) 10 MG tablet Take 1 tablet (10 mg total) by mouth every 6 (six) hours as needed for nausea or vomiting. 30 tablet 0  . Tiotropium Bromide Monohydrate (SPIRIVA RESPIMAT) 2.5 MCG/ACT AERS Inhale 2 puffs into the lungs daily. 1 Inhaler 4   No current facility-administered medications for this visit.    SURGICAL HISTORY:  Past Surgical History  Procedure Laterality Date  . Cholecystectomy    . Heart bypass    . Cardiac catheterization  2001  . Skin cancer excision N/A     Nose  . Abdominal aortic aneurysm repair    .  Eye surgery Bilateral     cataract removal  . Colonoscopy w/ polypectomy    . Cardiac catheterization N/A 07/16/2014    Procedure: Right/Left Heart Cath and Coronary Angiography;  Surgeon: Burnell Blanks, MD;  Location: [place] [facility];  Service: Cardiovascular;  Laterality: N/A;  . Coronary artery bypass graft    . Video bronchoscopy with endobronchial navigation N/A 07/29/2014    Procedure: VIDEO BRONCHOSCOPY WITH ENDOBRONCHIAL NAVIGATION;  Surgeon: Collene Gobble, MD;  Location: [facility];  Service: Thoracic;  Laterality: N/A;  . Peripheral vascular catheterization N/A 08/05/2014    Procedure: Abdominal Aortogram w/Lower Extremity;  Surgeon: Wellington Hampshire, MD;   Location: [place] [facility];  Service: Cardiovascular;  Laterality: N/A;  . Endarterectomy femoral Left 08/19/2014    Procedure: PROFUNDA FEMORIS ENDARTERECTOMY ;  Surgeon: Rosetta Posner, MD;  Location: [facility];  Service: Vascular;  Laterality: Left;  . Femoral-popliteal bypass graft Left 08/19/2014    Procedure: FEMORAL-POPLITEAL ARTERY BYPASS GRAFT WITH PROPATEN GORTEX ;  Surgeon: Rosetta Posner, MD;  Location: [place];  Service: Vascular;  Laterality: Left;  . Patch angioplasty Left 08/19/2014    Procedure: LEFT PROFUNDA FEMORIS PATCH ANGIOPLASTY USING HEMASHIELD PLATINUM FINESSE PATCH;  Surgeon: Rosetta Posner, MD;  Location: [facility];  Service: Vascular;  Laterality: Left;    REVIEW OF SYSTEMS:  Review of Systems  Constitutional: Positive for malaise/fatigue. Negative for fever, chills, weight loss and diaphoresis.  HENT: Negative for congestion, ear discharge, ear pain, hearing loss, nosebleeds, sore throat and tinnitus.   Eyes: Negative for blurred vision, double vision, photophobia, pain, discharge and redness.  Respiratory: Positive for shortness of breath. Negative for cough, hemoptysis, sputum production, wheezing and stridor.   Cardiovascular: Negative for chest pain, palpitations, orthopnea, claudication, leg swelling and PND.  Gastrointestinal: Negative for heartburn, nausea, vomiting, abdominal pain, diarrhea, constipation, blood in stool and melena.  Genitourinary: Negative.   Musculoskeletal: Negative.   Skin: Negative.   Neurological: Negative for dizziness, tingling, focal weakness, seizures, weakness and headaches.  Endo/Heme/Allergies: Does not bruise/bleed easily.  Psychiatric/Behavioral: Negative for depression. The patient is not nervous/anxious and does not have insomnia.      PHYSICAL EXAMINATION: Physical Exam  Constitutional: He is oriented to person, place, and time and well-developed, well-nourished, and in no distress.  HENT:  Head: Normocephalic and atraumatic.   Mouth/Throat: Oropharynx is clear and moist.  Eyes: Pupils are equal, round, and reactive to light.  Neck: Normal range of motion. Neck supple. No JVD present. No tracheal deviation present. No thyromegaly present.  Cardiovascular: Normal rate, regular rhythm, normal heart sounds and intact distal pulses.  Exam reveals no gallop and no friction rub.   No murmur heard. Pulmonary/Chest: Effort normal and breath sounds normal. No respiratory distress. He has no wheezes. He has no rales.  Abdominal: Soft. Bowel sounds are normal. He exhibits no distension and no mass. There is no tenderness.  Musculoskeletal: Normal range of motion. He exhibits no edema or tenderness.  Lymphadenopathy:    He has no cervical adenopathy.  Neurological: He is alert and oriented to person, place, and time. He has normal reflexes. Gait normal.  Skin: Skin is warm and dry. No rash noted.    ECOG PERFORMANCE STATUS: 1 - Symptomatic but completely ambulatory  Blood pressure 111/55, pulse 69, temperature 98.2 F (36.8 C), temperature source Oral, resp. rate 20, height 5' 8.5" (1.74 m), weight 182 lb 4.8 oz (82.691 kg), SpO2 100 %.  LABORATORY DATA:  Lab Results  Component Value Date   WBC 2.5* 09/17/2014   HGB 9.6* 09/17/2014   HCT 32.1* 09/17/2014   MCV 77.9* 09/17/2014   PLT 150 09/17/2014      Chemistry      Component Value Date/Time   NA 138 09/17/2014 1139   NA 133* 08/21/2014 0312   K 4.1 09/17/2014 1139   K 4.1 08/21/2014 0312   CL 101 08/21/2014 0312   CO2 24 09/17/2014 1139   CO2 25 08/21/2014 0312   BUN 20.6 09/17/2014 1139   BUN 8 08/21/2014 0312   CREATININE 1.0 09/17/2014 1139   CREATININE 0.96 08/21/2014 0312      Component Value Date/Time   CALCIUM 9.0 09/17/2014 1139   CALCIUM 8.5* 08/21/2014 0312   ALKPHOS 60 09/17/2014 1139   ALKPHOS 82 08/18/2014 0945   AST 28 09/17/2014 1139   AST 29 08/18/2014 0945   ALT 11 09/17/2014 1139   ALT 9* 08/18/2014 0945   BILITOT 0.44  09/17/2014 1139   BILITOT 0.4 08/18/2014 0945       RADIOGRAPHIC STUDIES:  Mr Jeri Cos Wo Contrast  08/26/2014   CLINICAL DATA:  Adenocarcinoma of the right lung. Skin and prostate cancer. Peripheral artery disease. Iron deficiency anemia.  EXAM: MRI HEAD WITHOUT AND WITH CONTRAST  TECHNIQUE: Multiplanar, multiecho pulse sequences of the brain and surrounding structures were obtained without and with intravenous contrast.  CONTRAST:  48m MULTIHANCE GADOBENATE DIMEGLUMINE 529 MG/ML IV SOLN  COMPARISON:  None available.  FINDINGS: In 6 mm focus restricted diffusion is present in the posterior medial left parietal lobe. Scattered periventricular and subcortical T2 hyperintensities are evident bilaterally.  Postcontrast images demonstrate a punctate focus of enhancement in the left parietal lobe approximate 1.0 cm above the area of restricted diffusion. No other definite enhancement is present.  Flow is present in the major intracranial arteries. Bilateral lens extractions are present. Globes and orbits are otherwise intact. Mild mucosal thickening is present anteriorly in the right maxillary sinus. The paranasal sinuses are otherwise clear. There is some fluid in the mastoid air cells, left greater than right. No obstructing nasopharyngeal lesion is present.  IMPRESSION: 1. Punctate area of enhancement along the gray-white junction in the left parietal lobe is concerning for a focal metastasis. 2. 6 mm focus of restricted diffusion approximately 1 cm below the focus of enhancement. This may represent an acute ischemic lesion. An additional metastasis is considered less likely without associated enhancement. 3. Left greater than right mastoid effusion. No obstructing nasopharyngeal lesion is evident.   Electronically Signed   By: CSan MorelleM.D.   On: 08/26/2014 16:56     ASSESSMENT/PLAN:  No problem-specific assessment & plan notes found for this encounter.  patient is a 74year old Caucasian  male recently diagnosed with stage IV non-small cell lung cancer. He is currently being treated with systemic chemotherapy anticipate that the patient will need systemic chemotherapy and that would be in the form of carboplatin for an AUC of 5 and Alimta at 500 mg/m given every 3 weeks. He status post 1 cycle. He tolerated his first cycle chemotherapy without difficulty. He will continue with weekly labs as scheduled. He'll follow-up in 2 weeks prior to start of cycle #2.   JAwilda MetroE, PA-C 09/17/2014   All questions were answered. The patient knows to call the clinic with any problems, questions or concerns. We can certainly see the patient much sooner if necessary.

## 2014-09-18 ENCOUNTER — Ambulatory Visit
Admission: RE | Admit: 2014-09-18 | Discharge: 2014-09-18 | Disposition: A | Discharge status: Home / Self Care | Payer: Medicare Other | Source: Ambulatory Visit | Attending: Radiation Oncology | Admitting: Radiation Oncology

## 2014-09-18 DIAGNOSIS — C7931 Secondary malignant neoplasm of brain: Secondary | ICD-10-CM

## 2014-09-18 MED ORDER — GADOBENATE DIMEGLUMINE 529 MG/ML IV SOLN
17.0000 mL | Freq: Once | INTRAVENOUS | Status: AC | PRN
  Administered 2014-09-18: 17 mL via INTRAVENOUS

## 2014-09-21 ENCOUNTER — Ambulatory Visit: Payer: Medicare Other | Admitting: Radiation Oncology

## 2014-09-21 ENCOUNTER — Ambulatory Visit: Admission: RE | Admit: 2014-09-21 | Payer: Medicare Other | Source: Ambulatory Visit | Admitting: Radiation Oncology

## 2014-09-21 ENCOUNTER — Ambulatory Visit
Admission: RE | Admit: 2014-09-21 | Discharge: 2014-09-21 | Disposition: A | Discharge status: Home / Self Care | Payer: Medicare Other | Source: Ambulatory Visit | Attending: Radiation Oncology | Admitting: Radiation Oncology

## 2014-09-21 ENCOUNTER — Encounter: Payer: Self-pay | Admitting: Radiation Oncology

## 2014-09-21 VITALS — BP 111/64 | HR 85 | Resp 16 | Wt 186.6 lb

## 2014-09-21 DIAGNOSIS — C3491 Malignant neoplasm of unspecified part of right bronchus or lung: Secondary | ICD-10-CM

## 2014-09-21 NOTE — Progress Notes (Signed)
Here with wife to review recent MRI results. Reports intermittent dizziness continues. Reports a persistent cough with clear sputum and SOB with exertion. Reports mild soreness of left femoral angioplasty site. Bilateral lower leg edema has resolved. Denies difficulty swallowing or hemoptysis. Denies seizures, headaches, nausea, vomiting, diplopia or ringing in the ears. Reports a normal appetite. Reports he sleeps 2-3 hours a time at night only. Take decadron with chemo only. Had chemotherapy approximately 2 weeks ago with next one scheduled for 10/01/2014.  BP 111/64 mmHg  Pulse 85  Resp 16  Wt 186 lb 9.6 oz (84.641 kg) Wt Readings from Last 3 Encounters:  09/21/14 186 lb 9.6 oz (84.641 kg)  09/18/14 185 lb (83.915 kg)  09/17/14 182 lb 4.8 oz (82.691 kg)

## 2014-09-21 NOTE — Progress Notes (Signed)
Radiation Oncology         [phone] ________________________________  Name: Erie Radu  MRN: [id number]  Date: 09/21/2014  DOB: 10-05-1940  Follow-Up Visit Note  CC: Leonard Downing, MD  Collene Gobble, MD  Diagnosis: 74 year old gentleman with a solitary left parietal punctate 2 mm brain lesion of uncertain significance.   Interval Since Last Radiation:  N/A  Narrative:  Mr. Mcclenathan is a very nice 74 year old gentleman who was being set up for SRS to a small solitary brain met. However, the finding seemed less convincing on planning MRI raising a question of metastasis versus vascular phenomenon.  ALLERGIES:  has No Known Allergies.  Meds: Current Outpatient Prescriptions  Medication Sig Dispense Refill  . aspirin EC 81 MG tablet Take 81 mg by mouth daily.    Marland Kitchen FeFum-FePoly-FA-B Cmp-C-Biot (INTEGRA PLUS) CAPS Take 1 capsule by mouth every morning. 30 capsule 2  . folic acid (FOLVITE) 1 MG tablet Take 1 tablet (1 mg total) by mouth daily. 30 tablet 4  . lisinopril (PRINIVIL,ZESTRIL) 20 MG tablet Take 20 mg by mouth daily.    . pravastatin (PRAVACHOL) 40 MG tablet Take 40 mg by mouth daily.    . Tiotropium Bromide Monohydrate (SPIRIVA RESPIMAT) 2.5 MCG/ACT AERS Inhale 2 puffs into the lungs daily. 1 Inhaler 4  . dexamethasone (DECADRON) 4 MG tablet 4 mg po bid, the day before, day of and day after chemo 40 tablet 1  . furosemide (LASIX) 40 MG tablet Take 40 mg by mouth daily as needed for edema.   0  . LORazepam (ATIVAN) 1 MG tablet Take 1 tablet (1 mg total) by mouth every 8 (eight) hours. (Patient not taking: Reported on 09/21/2014) 30 tablet 0  . oxyCODONE-acetaminophen (PERCOCET/ROXICET) 5-325 MG per tablet Take 1-2 tablets by mouth every 6 (six) hours as needed for moderate pain. (Patient not taking: Reported on 08/31/2014) 30 tablet 0  . prochlorperazine (COMPAZINE) 10 MG tablet Take 1 tablet (10 mg total) by mouth every 6 (six) hours as needed for nausea or vomiting.  (Patient not taking: Reported on 09/21/2014) 30 tablet 0   No current facility-administered medications for this encounter.    Physical Findings: The patient is in no acute distress. Patient is alert and oriented x3.  weight is 186 lb 9.6 oz (84.641 kg). His blood pressure is 111/64 and his pulse is 85. His respiration is 16. . No significant changes are currently noted in regards to the patient's overall health status.     Lab Findings: Lab Results  Component Value Date   WBC 2.5* 09/17/2014   WBC 8.0 08/21/2014   HGB 9.6* 09/17/2014   HGB 8.2* 08/21/2014   HCT 32.1* 09/17/2014   HCT 28.7* 08/21/2014   PLT 150 09/17/2014   PLT 227 08/21/2014    Lab Results  Component Value Date   NA 138 09/17/2014   NA 133* 08/21/2014   K 4.1 09/17/2014   K 4.1 08/21/2014   CHLORIDE 107 09/17/2014   CO2 24 09/17/2014   CO2 25 08/21/2014   GLUCOSE 108 09/17/2014   GLUCOSE 107* 08/21/2014   BUN 20.6 09/17/2014   BUN 8 08/21/2014   CREATININE 1.0 09/17/2014   CREATININE 0.96 08/21/2014   BILITOT 0.44 09/17/2014   BILITOT 0.4 08/18/2014   ALKPHOS 60 09/17/2014   ALKPHOS 82 08/18/2014   AST 28 09/17/2014   AST 29 08/18/2014   ALT 11 09/17/2014   ALT 9* 08/18/2014   PROT  6.1* 09/17/2014   PROT 6.8 08/18/2014   ALBUMIN 3.4* 09/17/2014   ALBUMIN 3.6 08/18/2014   CALCIUM 9.0 09/17/2014   CALCIUM 8.5* 08/21/2014   ANIONGAP 7 09/17/2014   ANIONGAP 7 08/21/2014    Radiographic Findings: Mr Jeri Cos Wo Contrast  09/18/2014   CLINICAL DATA:  Adenocarcinoma of the right lung. Brain metastases. SRS targeting.  EXAM: MRI HEAD WITHOUT AND WITH CONTRAST  TECHNIQUE: Multiplanar, multiecho pulse sequences of the brain and surrounding structures were obtained without and with intravenous contrast.  CONTRAST:  4m MULTIHANCE GADOBENATE DIMEGLUMINE 529 MG/ML IV SOLN  COMPARISON:  MRI brain 08/26/2014.  FINDINGS: A punctate area of susceptibility within the left parietal lobe on image 95 of series 10  measures 3 mm on the coronal sequence 11. This is decreased in conspicuity since the prior study. No other focal areas of enhancement are present. The previously noted restricted diffusion has resolved as well.  Scattered periventricular and subcortical T2 hyperintensities are again noted bilaterally.  Ventricles are proportionate to the degree of atrophy. No significant extra-axial fluid collection is present.  Flow is present in the major intracranial arteries. The patient is status post bilateral lens replacements. The paranasal sinuses are clear. There is some fluid in the mastoid air cells bilaterally without obstructing nasopharyngeal lesion.  IMPRESSION: 1. Punctate focus of enhancement at the gray-white junction in the left parietal lobe appears slightly smaller than on the prior exam. In the absence of treatment, this raises suspicion as to whether this is a metastatic lesion. A single metastasis is still in the differential diagnosis for this patient with a primary lung cancer. A resolving ischemic lesion is also considered. Additional 1 month follow-up exam could be performed if clinically indicated. 2. No other foci of enhancement are evident. 3. Previously noted restricted diffusion has resolved compatible with a previous adjacent ischemic lesion. 4. Scattered periventricular and subcortical T2 changes are otherwise similar to the prior study.   Electronically Signed   By: CSan MorelleM.D.   On: 09/18/2014 13:53   Mr BJeri CosWJSContrast  08/26/2014   CLINICAL DATA:  Adenocarcinoma of the right lung. Skin and prostate cancer. Peripheral artery disease. Iron deficiency anemia.  EXAM: MRI HEAD WITHOUT AND WITH CONTRAST  TECHNIQUE: Multiplanar, multiecho pulse sequences of the brain and surrounding structures were obtained without and with intravenous contrast.  CONTRAST:  180mMULTIHANCE GADOBENATE DIMEGLUMINE 529 MG/ML IV SOLN  COMPARISON:  None available.  FINDINGS: In 6 mm focus restricted  diffusion is present in the posterior medial left parietal lobe. Scattered periventricular and subcortical T2 hyperintensities are evident bilaterally.  Postcontrast images demonstrate a punctate focus of enhancement in the left parietal lobe approximate 1.0 cm above the area of restricted diffusion. No other definite enhancement is present.  Flow is present in the major intracranial arteries. Bilateral lens extractions are present. Globes and orbits are otherwise intact. Mild mucosal thickening is present anteriorly in the right maxillary sinus. The paranasal sinuses are otherwise clear. There is some fluid in the mastoid air cells, left greater than right. No obstructing nasopharyngeal lesion is present.  IMPRESSION: 1. Punctate area of enhancement along the gray-white junction in the left parietal lobe is concerning for a focal metastasis. 2. 6 mm focus of restricted diffusion approximately 1 cm below the focus of enhancement. This may represent an acute ischemic lesion. An additional metastasis is considered less likely without associated enhancement. 3. Left greater than right mastoid effusion. No obstructing nasopharyngeal lesion is  evident.   Electronically Signed   By: San Morelle M.D.   On: 08/26/2014 16:56    Impression: This patient has stage IV adenocarcinoma of the lung and a small brain lesion of uncertain significance.  It may be a metastatic deposit, and possibly one that is responding to systemic therapy.  But, it is not definitively a brain met, so, SRS is not clearly recommended at this time.  Ongoing follow-up is warranted.  PLAN: Brain MRI and follow up in 1 month. Continue to follow up with medical oncology for systemic therapy.   I spent 20 minutes minutes face to face with the patient and more than 50% of that time was spent in counseling and/or coordination of care.    _____________________________________  Sheral Apley. Tammi Klippel, M.D.   This document serves as a record  of services personally performed by Tyler Pita, MD. It was created on his behalf by Derek Mound, a trained medical scribe. The creation of this record is based on the scribe's personal observations and the provider's statements to them. This document has been checked and approved by the attending provider.

## 2014-09-22 ENCOUNTER — Other Ambulatory Visit: Payer: Self-pay | Admitting: Internal Medicine

## 2014-09-24 ENCOUNTER — Ambulatory Visit: Admission: RE | Admit: 2014-09-24 | Payer: Medicare Other | Source: Ambulatory Visit | Admitting: Radiation Oncology

## 2014-09-24 ENCOUNTER — Other Ambulatory Visit: Payer: Medicare Other

## 2014-09-24 ENCOUNTER — Ambulatory Visit: Payer: Medicare Other | Admitting: Radiation Oncology

## 2014-09-24 NOTE — Patient Instructions (Signed)
Continue weekly labs as scheduled Follow-up in 2 weeks prior to the start of cycle #2 of your systemic chemotherapy

## 2014-09-25 ENCOUNTER — Other Ambulatory Visit (HOSPITAL_BASED_OUTPATIENT_CLINIC_OR_DEPARTMENT_OTHER): Payer: Medicare Other

## 2014-09-25 DIAGNOSIS — C3411 Malignant neoplasm of upper lobe, right bronchus or lung: Secondary | ICD-10-CM | POA: Diagnosis not present

## 2014-09-25 DIAGNOSIS — C3491 Malignant neoplasm of unspecified part of right bronchus or lung: Secondary | ICD-10-CM

## 2014-09-25 LAB — CBC WITH DIFFERENTIAL/PLATELET
BASO%: 0 % (ref 0.0–2.0)
BASOS ABS: 0 10*3/uL (ref 0.0–0.1)
EOS%: 0.8 % (ref 0.0–7.0)
Eosinophils Absolute: 0 10*3/uL (ref 0.0–0.5)
HEMATOCRIT: 34.7 % — AB (ref 38.4–49.9)
HEMOGLOBIN: 10.5 g/dL — AB (ref 13.0–17.1)
LYMPH#: 0.6 10*3/uL — AB (ref 0.9–3.3)
LYMPH%: 26.6 % (ref 14.0–49.0)
MCH: 24.6 pg — AB (ref 27.2–33.4)
MCHC: 30.3 g/dL — ABNORMAL LOW (ref 32.0–36.0)
MCV: 81.5 fL (ref 79.3–98.0)
MONO#: 0.4 10*3/uL (ref 0.1–0.9)
MONO%: 14.8 % — ABNORMAL HIGH (ref 0.0–14.0)
NEUT%: 57.8 % (ref 39.0–75.0)
NEUTROS ABS: 1.4 10*3/uL — AB (ref 1.5–6.5)
PLATELETS: 108 10*3/uL — AB (ref 140–400)
RBC: 4.26 10*6/uL (ref 4.20–5.82)
WBC: 2.4 10*3/uL — AB (ref 4.0–10.3)

## 2014-09-25 LAB — COMPREHENSIVE METABOLIC PANEL (CC13)
ALBUMIN: 3.6 g/dL (ref 3.5–5.0)
ALT: 14 U/L (ref 0–55)
AST: 36 U/L — ABNORMAL HIGH (ref 5–34)
Alkaline Phosphatase: 78 U/L (ref 40–150)
Anion Gap: 7 mEq/L (ref 3–11)
BUN: 9.9 mg/dL (ref 7.0–26.0)
CO2: 24 meq/L (ref 22–29)
Calcium: 8.8 mg/dL (ref 8.4–10.4)
Chloride: 105 mEq/L (ref 98–109)
Creatinine: 0.8 mg/dL (ref 0.7–1.3)
EGFR: 89 mL/min/{1.73_m2} — ABNORMAL LOW (ref >90)
GLUCOSE: 86 mg/dL (ref 70–140)
Potassium: 4.2 mEq/L (ref 3.5–5.1)
Sodium: 137 mEq/L (ref 136–145)
TOTAL PROTEIN: 6.3 g/dL — AB (ref 6.4–8.3)
Total Bilirubin: 0.36 mg/dL (ref 0.20–1.20)

## 2014-10-01 ENCOUNTER — Ambulatory Visit (HOSPITAL_BASED_OUTPATIENT_CLINIC_OR_DEPARTMENT_OTHER): Payer: Medicare Other | Admitting: Internal Medicine

## 2014-10-01 ENCOUNTER — Telehealth: Payer: Self-pay | Admitting: Internal Medicine

## 2014-10-01 ENCOUNTER — Encounter: Payer: Self-pay | Admitting: Internal Medicine

## 2014-10-01 ENCOUNTER — Ambulatory Visit (HOSPITAL_BASED_OUTPATIENT_CLINIC_OR_DEPARTMENT_OTHER): Payer: Medicare Other

## 2014-10-01 ENCOUNTER — Other Ambulatory Visit (HOSPITAL_BASED_OUTPATIENT_CLINIC_OR_DEPARTMENT_OTHER): Payer: Medicare Other

## 2014-10-01 VITALS — BP 138/64 | HR 67 | Temp 98.0°F | Resp 18 | Ht 68.5 in | Wt 189.1 lb

## 2014-10-01 DIAGNOSIS — C3411 Malignant neoplasm of upper lobe, right bronchus or lung: Secondary | ICD-10-CM

## 2014-10-01 DIAGNOSIS — C3491 Malignant neoplasm of unspecified part of right bronchus or lung: Secondary | ICD-10-CM

## 2014-10-01 DIAGNOSIS — Z5111 Encounter for antineoplastic chemotherapy: Secondary | ICD-10-CM | POA: Diagnosis not present

## 2014-10-01 DIAGNOSIS — I739 Peripheral vascular disease, unspecified: Secondary | ICD-10-CM

## 2014-10-01 LAB — COMPREHENSIVE METABOLIC PANEL (CC13)
ALK PHOS: 90 U/L (ref 40–150)
ALT: 10 U/L (ref 0–55)
AST: 28 U/L (ref 5–34)
Albumin: 3.7 g/dL (ref 3.5–5.0)
Anion Gap: 8 mEq/L (ref 3–11)
BUN: 10.2 mg/dL (ref 7.0–26.0)
CALCIUM: 9 mg/dL (ref 8.4–10.4)
CHLORIDE: 105 meq/L (ref 98–109)
CO2: 22 meq/L (ref 22–29)
CREATININE: 0.8 mg/dL (ref 0.7–1.3)
EGFR: 89 mL/min/{1.73_m2} — AB (ref >90)
Glucose: 131 mg/dl (ref 70–140)
Potassium: 4.1 mEq/L (ref 3.5–5.1)
Sodium: 135 mEq/L — ABNORMAL LOW (ref 136–145)
Total Bilirubin: 0.34 mg/dL (ref 0.20–1.20)
Total Protein: 6.6 g/dL (ref 6.4–8.3)

## 2014-10-01 LAB — CBC WITH DIFFERENTIAL/PLATELET
BASO%: 0.1 % (ref 0.0–2.0)
Basophils Absolute: 0 10*3/uL (ref 0.0–0.1)
EOS%: 0 % (ref 0.0–7.0)
Eosinophils Absolute: 0 10*3/uL (ref 0.0–0.5)
HCT: 36.1 % — ABNORMAL LOW (ref 38.4–49.9)
HEMOGLOBIN: 11.5 g/dL — AB (ref 13.0–17.1)
LYMPH%: 7.5 % — AB (ref 14.0–49.0)
MCH: 25.9 pg — AB (ref 27.2–33.4)
MCHC: 31.7 g/dL — ABNORMAL LOW (ref 32.0–36.0)
MCV: 81.7 fL (ref 79.3–98.0)
MONO#: 0.1 10*3/uL (ref 0.1–0.9)
MONO%: 4.3 % (ref 0.0–14.0)
NEUT#: 2.1 10*3/uL (ref 1.5–6.5)
NEUT%: 88.1 % — ABNORMAL HIGH (ref 39.0–75.0)
Platelets: 239 10*3/uL (ref 140–400)
RBC: 4.42 10*6/uL (ref 4.20–5.82)
RDW: 36.1 % — ABNORMAL HIGH (ref 11.0–14.6)
WBC: 2.4 10*3/uL — AB (ref 4.0–10.3)
lymph#: 0.2 10*3/uL — ABNORMAL LOW (ref 0.9–3.3)

## 2014-10-01 MED ORDER — SODIUM CHLORIDE 0.9 % IV SOLN
500.0000 mg/m2 | Freq: Once | INTRAVENOUS | Status: AC
  Administered 2014-10-01: 1000 mg via INTRAVENOUS
  Filled 2014-10-01: qty 40

## 2014-10-01 MED ORDER — SODIUM CHLORIDE 0.9 % IV SOLN
Freq: Once | INTRAVENOUS | Status: AC
  Administered 2014-10-01: 12:00:00 via INTRAVENOUS
  Filled 2014-10-01: qty 8

## 2014-10-01 MED ORDER — SODIUM CHLORIDE 0.9 % IJ SOLN
3.0000 mL | INTRAMUSCULAR | Status: DC | PRN
  Filled 2014-10-01: qty 10

## 2014-10-01 MED ORDER — SODIUM CHLORIDE 0.9 % IV SOLN
Freq: Once | INTRAVENOUS | Status: AC
  Administered 2014-10-01: 12:00:00 via INTRAVENOUS

## 2014-10-01 MED ORDER — SODIUM CHLORIDE 0.9 % IV SOLN
515.0000 mg | Freq: Once | INTRAVENOUS | Status: AC
  Administered 2014-10-01: 520 mg via INTRAVENOUS
  Filled 2014-10-01: qty 52

## 2014-10-01 NOTE — Patient Instructions (Signed)
Carboplatin injection What is this medicine? CARBOPLATIN (KAR boe pla tin) is a chemotherapy drug. It targets fast dividing cells, like cancer cells, and causes these cells to die. This medicine is used to treat ovarian cancer and many other cancers. This medicine may be used for other purposes; ask your health care provider or pharmacist if you have questions. COMMON BRAND NAME(S): Paraplatin What should I tell my health care provider before I take this medicine? They need to know if you have any of these conditions: -blood disorders -hearing problems -kidney disease -recent or ongoing radiation therapy -an unusual or allergic reaction to carboplatin, cisplatin, other chemotherapy, other medicines, foods, dyes, or preservatives -pregnant or trying to get pregnant -breast-feeding How should I use this medicine? This drug is usually given as an infusion into a vein. It is administered in a hospital or clinic by a specially trained health care professional. Talk to your pediatrician regarding the use of this medicine in children. Special care may be needed. Overdosage: If you think you have taken too much of this medicine contact a poison control center or emergency room at once. NOTE: This medicine is only for you. Do not share this medicine with others. What if I miss a dose? It is important not to miss a dose. Call your doctor or health care professional if you are unable to keep an appointment. What may interact with this medicine? -medicines for seizures -medicines to increase blood counts like filgrastim, pegfilgrastim, sargramostim -some antibiotics like amikacin, gentamicin, neomycin, streptomycin, tobramycin -vaccines Talk to your doctor or health care professional before taking any of these medicines: -acetaminophen -aspirin -ibuprofen -ketoprofen -naproxen This list may not describe all possible interactions. Give your health care provider a list of all the medicines, herbs,  non-prescription drugs, or dietary supplements you use. Also tell them if you smoke, drink alcohol, or use illegal drugs. Some items may interact with your medicine. What should I watch for while using this medicine? Your condition will be monitored carefully while you are receiving this medicine. You will need important blood work done while you are taking this medicine. This drug may make you feel generally unwell. This is not uncommon, as chemotherapy can affect healthy cells as well as cancer cells. Report any side effects. Continue your course of treatment even though you feel ill unless your doctor tells you to stop. In some cases, you may be given additional medicines to help with side effects. Follow all directions for their use. Call your doctor or health care professional for advice if you get a fever, chills or sore throat, or other symptoms of a cold or flu. Do not treat yourself. This drug decreases your body's ability to fight infections. Try to avoid being around people who are sick. This medicine may increase your risk to bruise or bleed. Call your doctor or health care professional if you notice any unusual bleeding. Be careful brushing and flossing your teeth or using a toothpick because you may get an infection or bleed more easily. If you have any dental work done, tell your dentist you are receiving this medicine. Avoid taking products that contain aspirin, acetaminophen, ibuprofen, naproxen, or ketoprofen unless instructed by your doctor. These medicines may hide a fever. Do not become pregnant while taking this medicine. Women should inform their doctor if they wish to become pregnant or think they might be pregnant. There is a potential for serious side effects to an unborn child. Talk to your health care professional or  pharmacist for more information. Do not breast-feed an infant while taking this medicine. What side effects may I notice from receiving this medicine? Side effects  that you should report to your doctor or health care professional as soon as possible: -allergic reactions like skin rash, itching or hives, swelling of the face, lips, or tongue -signs of infection - fever or chills, cough, sore throat, pain or difficulty passing urine -signs of decreased platelets or bleeding - bruising, pinpoint red spots on the skin, black, tarry stools, nosebleeds -signs of decreased red blood cells - unusually weak or tired, fainting spells, lightheadedness -breathing problems -changes in hearing -changes in vision -chest pain -high blood pressure -low blood counts - This drug may decrease the number of white blood cells, red blood cells and platelets. You may be at increased risk for infections and bleeding. -nausea and vomiting -pain, swelling, redness or irritation at the injection site -pain, tingling, numbness in the hands or feet -problems with balance, talking, walking -trouble passing urine or change in the amount of urine Side effects that usually do not require medical attention (report to your doctor or health care professional if they continue or are bothersome): -hair loss -loss of appetite -metallic taste in the mouth or changes in taste This list may not describe all possible side effects. Call your doctor for medical advice about side effects. You may report side effects to FDA at 1-800-FDA-1088. Where should I keep my medicine? This drug is given in a hospital or clinic and will not be stored at home. NOTE: This sheet is a summary. It may not cover all possible information. If you have questions about this medicine, talk to your doctor, pharmacist, or health care provider.  2015, Elsevier/Gold Standard. (2007-05-28 14:38:05) Pemetrexed injection What is this medicine? PEMETREXED (PEM e TREX ed) is a chemotherapy drug. This medicine affects cells that are rapidly growing, such as cancer cells and cells in your mouth and stomach. It is usually used to  treat lung cancers like non-small cell lung cancer and mesothelioma. It may also be used to treat other cancers. This medicine may be used for other purposes; ask your health care provider or pharmacist if you have questions. COMMON BRAND NAME(S): Alimta What should I tell my health care provider before I take this medicine? They need to know if you have any of these conditions: -if you frequently drink alcohol containing beverages -infection (especially a virus infection such as chickenpox, cold sores, or herpes) -kidney disease -liver disease -low blood counts, like low platelets, red bloods, or white blood cells -an unusual or allergic reaction to pemetrexed, mannitol, other medicines, foods, dyes, or preservatives -pregnant or trying to get pregnant -breast-feeding How should I use this medicine? This drug is given as an infusion into a vein. It is administered in a hospital or clinic by a specially trained health care professional. Talk to your pediatrician regarding the use of this medicine in children. Special care may be needed. Overdosage: If you think you have taken too much of this medicine contact a poison control center or emergency room at once. NOTE: This medicine is only for you. Do not share this medicine with others. What if I miss a dose? It is important not to miss your dose. Call your doctor or health care professional if you are unable to keep an appointment. What may interact with this medicine? -aspirin and aspirin-like medicines -medicines to increase blood counts like filgrastim, pegfilgrastim, sargramostim -methotrexate -NSAIDS, medicines for pain   and inflammation, like ibuprofen or naproxen -probenecid -pyrimethamine -vaccines Talk to your doctor or health care professional before taking any of these medicines: -acetaminophen -aspirin -ibuprofen -ketoprofen -naproxen This list may not describe all possible interactions. Give your health care provider a  list of all the medicines, herbs, non-prescription drugs, or dietary supplements you use. Also tell them if you smoke, drink alcohol, or use illegal drugs. Some items may interact with your medicine. What should I watch for while using this medicine? Visit your doctor for checks on your progress. This drug may make you feel generally unwell. This is not uncommon, as chemotherapy can affect healthy cells as well as cancer cells. Report any side effects. Continue your course of treatment even though you feel ill unless your doctor tells you to stop. In some cases, you may be given additional medicines to help with side effects. Follow all directions for their use. Call your doctor or health care professional for advice if you get a fever, chills or sore throat, or other symptoms of a cold or flu. Do not treat yourself. This drug decreases your body's ability to fight infections. Try to avoid being around people who are sick. This medicine may increase your risk to bruise or bleed. Call your doctor or health care professional if you notice any unusual bleeding. Be careful brushing and flossing your teeth or using a toothpick because you may get an infection or bleed more easily. If you have any dental work done, tell your dentist you are receiving this medicine. Avoid taking products that contain aspirin, acetaminophen, ibuprofen, naproxen, or ketoprofen unless instructed by your doctor. These medicines may hide a fever. Call your doctor or health care professional if you get diarrhea or mouth sores. Do not treat yourself. To protect your kidneys, drink water or other fluids as directed while you are taking this medicine. Men and women must use effective birth control while taking this medicine. You may also need to continue using effective birth control for a time after stopping this medicine. Do not become pregnant while taking this medicine. Tell your doctor right away if you think that you or your partner  might be pregnant. There is a potential for serious side effects to an unborn child. Talk to your health care professional or pharmacist for more information. Do not breast-feed an infant while taking this medicine. This medicine may lower sperm counts. What side effects may I notice from receiving this medicine? Side effects that you should report to your doctor or health care professional as soon as possible: -allergic reactions like skin rash, itching or hives, swelling of the face, lips, or tongue -low blood counts - this medicine may decrease the number of white blood cells, red blood cells and platelets. You may be at increased risk for infections and bleeding. -signs of infection - fever or chills, cough, sore throat, pain or difficulty passing urine -signs of decreased platelets or bleeding - bruising, pinpoint red spots on the skin, black, tarry stools, blood in the urine -signs of decreased red blood cells - unusually weak or tired, fainting spells, lightheadedness -breathing problems, like a dry cough -changes in emotions or moods -chest pain -confusion -diarrhea -high blood pressure -mouth or throat sores or ulcers -pain, swelling, warmth in the leg -pain on swallowing -swelling of the ankles, feet, hands -trouble passing urine or change in the amount of urine -vomiting -yellowing of the eyes or skin Side effects that usually do not require medical attention (report to   your doctor or health care professional if they continue or are bothersome): -hair loss -loss of appetite -nausea -stomach upset This list may not describe all possible side effects. Call your doctor for medical advice about side effects. You may report side effects to FDA at 1-800-FDA-1088. Where should I keep my medicine? This drug is given in a hospital or clinic and will not be stored at home. NOTE: This sheet is a summary. It may not cover all possible information. If you have questions about this  medicine, talk to your doctor, pharmacist, or health care provider.  2015, Elsevier/Gold Standard. (2007-09-24 13:24:03)  

## 2014-10-01 NOTE — Progress Notes (Signed)
[place] Telephone:([phone]) [phone]   Fax:([phone]) [phone]  OFFICE PROGRESS NOTE  Leonard Downing, MD [address] [place] [address]   DIAGNOSIS: Adenocarcinoma of right lung  Staging form: Lung, AJCC 7th Edition  Clinical stage from 08/13/2014: Stage IV (T2a, N2, M1a) - Signed by Curt Bears, MD on 08/15/2014  PRIOR THERAPY: None  CURRENT THERAPY: Systemic chemotherapy carboplatin for AUC of 5 and Alimta at 500 mg/m given every 3 weeks. Status post 1 cycle  INTERVAL HISTORY: Steve Baker 74 y.o. male returns to the clinic today for follow-up visit accompanied by his wife. The patient is tolerating his current systemic chemotherapy with carboplatin and Alimta fairly well with no significant adverse effects. He denied having any significant fatigue or weakness. He denied having any nausea or vomiting. He has no fever or chills. The patient denied having any significant chest pain but continues to have shortness of breath with exertion with no cough or hemoptysis. He is here today to start cycle #2 of his systemic chemotherapy. He was found initially to have a concerning lesion on the MRI of the brain but repeat imaging studies showed decrease on this lesion. He is followed by observation by Dr. Tammi Klippel for his questionable brain lesion.  MEDICAL HISTORY: Past Medical History  Diagnosis Date  . Hyperlipidemia   . Irregular heart beat     Pt stated his heart skips a beat every now and then  . Shortness of breath dyspnea     with ambulation  . Peripheral vascular disease   . Anxiety   . Coronary artery disease   . Hypertension     lisinopril  . Dysrhythmia   . COPD (chronic obstructive pulmonary disease)   . History of bronchitis   . Anemia   . 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 Brain cancer     brain metastasis  . Lung cancer     adenocarcinoma of right lung    ALLERGIES:  has No Known Allergies.  MEDICATIONS:  Current Outpatient  Prescriptions  Medication Sig Dispense Refill  . aspirin EC 81 MG tablet Take 81 mg by mouth daily.    Marland Kitchen dexamethasone (DECADRON) 4 MG tablet 4 mg po bid, the day before, day of and day after chemo 40 tablet 1  . FeFum-FePoly-FA-B Cmp-C-Biot (INTEGRA PLUS) CAPS Take 1 capsule by mouth every morning. 30 capsule 2  . folic acid (FOLVITE) 1 MG tablet Take 1 tablet (1 mg total) by mouth daily. 30 tablet 4  . lisinopril (PRINIVIL,ZESTRIL) 20 MG tablet Take 20 mg by mouth daily.    . pravastatin (PRAVACHOL) 40 MG tablet Take 40 mg by mouth daily.    . Tiotropium Bromide Monohydrate (SPIRIVA RESPIMAT) 2.5 MCG/ACT AERS Inhale 2 puffs into the lungs daily. 1 Inhaler 4  . furosemide (LASIX) 40 MG tablet Take 40 mg by mouth daily as needed for edema.   0  . LORazepam (ATIVAN) 1 MG tablet Take 1 tablet (1 mg total) by mouth every 8 (eight) hours. (Patient not taking: Reported on 09/21/2014) 30 tablet 0  . prochlorperazine (COMPAZINE) 10 MG tablet Take 1 tablet (10 mg total) by mouth every 6 (six) hours as needed for nausea or vomiting. (Patient not taking: Reported on 09/21/2014) 30 tablet 0   No current facility-administered medications for this visit.    SURGICAL HISTORY:  Past Surgical History  Procedure Laterality Date  . Cholecystectomy    . Heart bypass    .  Cardiac catheterization  2001  . Skin cancer excision N/A     Nose  . Abdominal aortic aneurysm repair    . Eye surgery Bilateral     cataract removal  . Colonoscopy w/ polypectomy    . Cardiac catheterization N/A 07/16/2014    Procedure: Right/Left Heart Cath and Coronary Angiography;  Surgeon: Burnell Blanks, MD;  Location: [facility];  Service: Cardiovascular;  Laterality: N/A;  . Coronary artery bypass graft    . Video bronchoscopy with endobronchial navigation N/A 07/29/2014    Procedure: VIDEO BRONCHOSCOPY WITH ENDOBRONCHIAL NAVIGATION;  Surgeon: Collene Gobble, MD;  Location: [facility];  Service: Thoracic;  Laterality:  N/A;  . Peripheral vascular catheterization N/A 08/05/2014    Procedure: Abdominal Aortogram w/Lower Extremity;  Surgeon: Wellington Hampshire, MD;  Location: [facility];  Service: Cardiovascular;  Laterality: N/A;  . Endarterectomy femoral Left 08/19/2014    Procedure: PROFUNDA FEMORIS ENDARTERECTOMY ;  Surgeon: Rosetta Posner, MD;  Location: [facility];  Service: Vascular;  Laterality: Left;  . Femoral-popliteal bypass graft Left 08/19/2014    Procedure: FEMORAL-POPLITEAL ARTERY BYPASS GRAFT WITH PROPATEN GORTEX ;  Surgeon: Rosetta Posner, MD;  Location: [facility];  Service: Vascular;  Laterality: Left;  . Patch angioplasty Left 08/19/2014    Procedure: LEFT PROFUNDA FEMORIS PATCH ANGIOPLASTY USING HEMASHIELD PLATINUM FINESSE PATCH;  Surgeon: Rosetta Posner, MD;  Location: [facility];  Service: Vascular;  Laterality: Left;    REVIEW OF SYSTEMS:  A comprehensive review of systems was negative.   PHYSICAL EXAMINATION: General appearance: alert, cooperative and no distress Head: Normocephalic, without obvious abnormality, atraumatic Neck: no adenopathy, no JVD, supple, symmetrical, trachea midline and thyroid not enlarged, symmetric, no tenderness/mass/nodules Lymph nodes: Cervical, supraclavicular, and axillary nodes normal. Resp: clear to auscultation bilaterally Back: symmetric, no curvature. ROM normal. No CVA tenderness. Cardio: regular rate and rhythm, S1, S2 normal, no murmur, click, rub or gallop GI: soft, non-tender; bowel sounds normal; no masses,  no organomegaly Extremities: extremities normal, atraumatic, no cyanosis or edema  ECOG PERFORMANCE STATUS: 1 - Symptomatic but completely ambulatory  Blood pressure 138/64, pulse 67, temperature 98 F (36.7 C), temperature source Oral, resp. rate 18, height 5' 8.5" (1.74 m), weight 189 lb 1.6 oz (85.775 kg), SpO2 100 %.  LABORATORY DATA: Lab Results  Component Value Date   WBC 2.4* 10/01/2014   HGB 11.5* 10/01/2014   HCT 36.1* 10/01/2014   MCV 81.7  10/01/2014   PLT 239 10/01/2014      Chemistry      Component Value Date/Time   NA 135* 10/01/2014 0957   NA 133* 08/21/2014 0312   K 4.1 10/01/2014 0957   K 4.1 08/21/2014 0312   CL 101 08/21/2014 0312   CO2 22 10/01/2014 0957   CO2 25 08/21/2014 0312   BUN 10.2 10/01/2014 0957   BUN 8 08/21/2014 0312   CREATININE 0.8 10/01/2014 0957   CREATININE 0.96 08/21/2014 0312      Component Value Date/Time   CALCIUM 9.0 10/01/2014 0957   CALCIUM 8.5* 08/21/2014 0312   ALKPHOS 90 10/01/2014 0957   ALKPHOS 82 08/18/2014 0945   AST 28 10/01/2014 0957   AST 29 08/18/2014 0945   ALT 10 10/01/2014 0957   ALT 9* 08/18/2014 0945   BILITOT 0.34 10/01/2014 0957   BILITOT 0.4 08/18/2014 0945       RADIOGRAPHIC STUDIES: Mr Jeri Cos Wo Contrast  Oct 02, 2014   CLINICAL DATA:  Adenocarcinoma  of the right lung. Brain metastases. SRS targeting.  EXAM: MRI HEAD WITHOUT AND WITH CONTRAST  TECHNIQUE: Multiplanar, multiecho pulse sequences of the brain and surrounding structures were obtained without and with intravenous contrast.  CONTRAST:  28m MULTIHANCE GADOBENATE DIMEGLUMINE 529 MG/ML IV SOLN  COMPARISON:  MRI brain 08/26/2014.  FINDINGS: A punctate area of susceptibility within the left parietal lobe on image 95 of series 10 measures 3 mm on the coronal sequence 11. This is decreased in conspicuity since the prior study. No other focal areas of enhancement are present. The previously noted restricted diffusion has resolved as well.  Scattered periventricular and subcortical T2 hyperintensities are again noted bilaterally.  Ventricles are proportionate to the degree of atrophy. No significant extra-axial fluid collection is present.  Flow is present in the major intracranial arteries. The patient is status post bilateral lens replacements. The paranasal sinuses are clear. There is some fluid in the mastoid air cells bilaterally without obstructing nasopharyngeal lesion.  IMPRESSION: 1. Punctate focus of  enhancement at the gray-white junction in the left parietal lobe appears slightly smaller than on the prior exam. In the absence of treatment, this raises suspicion as to whether this is a metastatic lesion. A single metastasis is still in the differential diagnosis for this patient with a primary lung cancer. A resolving ischemic lesion is also considered. Additional 1 month follow-up exam could be performed if clinically indicated. 2. No other foci of enhancement are evident. 3. Previously noted restricted diffusion has resolved compatible with a previous adjacent ischemic lesion. 4. Scattered periventricular and subcortical T2 changes are otherwise similar to the prior study.   Electronically Signed   By: CSan MorelleM.D.   On: 09/18/2014 13:53    ASSESSMENT AND PLAN: This is a very pleasant 74years old white male with a stage IV non-small cell lung cancer currently undergoing systemic chemotherapy with carboplatin and Alimta status post 1 cycle. The patient tolerated the first cycle well. I recommended for him to proceed with cycle #2 today as scheduled.  He will come back for follow-up visit in 3 weeks for reevaluation before starting cycle #3. The patient was advised to call immediately if he has any concerning symptoms in the interval. The patient voices understanding of current disease status and treatment options and is in agreement with the current care plan.  All questions were answered. The patient knows to call the clinic with any problems, questions or concerns. We can certainly see the patient much sooner if necessary.  Disclaimer: This note was dictated with voice recognition software. Similar sounding words can inadvertently be transcribed and may not be corrected upon review.

## 2014-10-01 NOTE — Telephone Encounter (Signed)
gave adn printed appt sched and avs for pt for Aug and Sept

## 2014-10-01 NOTE — Patient Instructions (Signed)
Smoking Cessation Quitting smoking is important to your health and has many advantages. However, it is not always easy to quit since nicotine is a very addictive drug. Oftentimes, people try 3 times or more before being able to quit. This document explains the best ways for you to prepare to quit smoking. Quitting takes hard work and a lot of effort, but you can do it. ADVANTAGES OF QUITTING SMOKING  You will live longer, feel better, and live better.  Your body will feel the impact of quitting smoking almost immediately.  Within 20 minutes, blood pressure decreases. Your pulse returns to its normal level.  After 8 hours, carbon monoxide levels in the blood return to normal. Your oxygen level increases.  After 24 hours, the chance of having a heart attack starts to decrease. Your breath, hair, and body stop smelling like smoke.  After 48 hours, damaged nerve endings begin to recover. Your sense of taste and smell improve.  After 72 hours, the body is virtually free of nicotine. Your bronchial tubes relax and breathing becomes easier.  After 2 to 12 weeks, lungs can hold more air. Exercise becomes easier and circulation improves.  The risk of having a heart attack, stroke, cancer, or lung disease is greatly reduced.  After 1 year, the risk of coronary heart disease is cut in half.  After 5 years, the risk of stroke falls to the same as a nonsmoker.  After 10 years, the risk of lung cancer is cut in half and the risk of other cancers decreases significantly.  After 15 years, the risk of coronary heart disease drops, usually to the level of a nonsmoker.  If you are pregnant, quitting smoking will improve your chances of having a healthy baby.  The people you live with, especially any children, will be healthier.  You will have extra money to spend on things other than cigarettes. QUESTIONS TO THINK ABOUT BEFORE ATTEMPTING TO QUIT You may want to talk about your answers with your  health care provider.  Why do you want to quit?  If you tried to quit in the past, what helped and what did not?  What will be the most difficult situations for you after you quit? How will you plan to handle them?  Who can help you through the tough times? Your family? Friends? A health care provider?  What pleasures do you get from smoking? What ways can you still get pleasure if you quit? Here are some questions to ask your health care provider:  How can you help me to be successful at quitting?  What medicine do you think would be best for me and how should I take it?  What should I do if I need more help?  What is smoking withdrawal like? How can I get information on withdrawal? GET READY  Set a quit date.  Change your environment by getting rid of all cigarettes, ashtrays, matches, and lighters in your home, car, or work. Do not let people smoke in your home.  Review your past attempts to quit. Think about what worked and what did not. GET SUPPORT AND ENCOURAGEMENT You have a better chance of being successful if you have help. You can get support in many ways.  Tell your family, friends, and coworkers that you are going to quit and need their support. Ask them not to smoke around you.  Get individual, group, or telephone counseling and support. Programs are available at local hospitals and health centers. Call   your local health department for information about programs in your area.  Spiritual beliefs and practices may help some smokers quit.  Download a "quit meter" on your computer to keep track of quit statistics, such as how long you have gone without smoking, cigarettes not smoked, and money saved.  Get a self-help book about quitting smoking and staying off tobacco. LEARN NEW SKILLS AND BEHAVIORS  Distract yourself from urges to smoke. Talk to someone, go for a walk, or occupy your time with a task.  Change your normal routine. Take a different route to work.  Drink tea instead of coffee. Eat breakfast in a different place.  Reduce your stress. Take a hot bath, exercise, or read a book.  Plan something enjoyable to do every day. Reward yourself for not smoking.  Explore interactive web-based programs that specialize in helping you quit. GET MEDICINE AND USE IT CORRECTLY Medicines can help you stop smoking and decrease the urge to smoke. Combining medicine with the above behavioral methods and support can greatly increase your chances of successfully quitting smoking.  Nicotine replacement therapy helps deliver nicotine to your body without the negative effects and risks of smoking. Nicotine replacement therapy includes nicotine gum, lozenges, inhalers, nasal sprays, and skin patches. Some may be available over-the-counter and others require a prescription.  Antidepressant medicine helps people abstain from smoking, but how this works is unknown. This medicine is available by prescription.  Nicotinic receptor partial agonist medicine simulates the effect of nicotine in your brain. This medicine is available by prescription. Ask your health care provider for advice about which medicines to use and how to use them based on your health history. Your health care provider will tell you what side effects to look out for if you choose to be on a medicine or therapy. Carefully read the information on the package. Do not use any other product containing nicotine while using a nicotine replacement product.  RELAPSE OR DIFFICULT SITUATIONS Most relapses occur within the first 3 months after quitting. Do not be discouraged if you start smoking again. Remember, most people try several times before finally quitting. You may have symptoms of withdrawal because your body is used to nicotine. You may crave cigarettes, be irritable, feel very hungry, cough often, get headaches, or have difficulty concentrating. The withdrawal symptoms are only temporary. They are strongest  when you first quit, but they will go away within 10-14 days. To reduce the chances of relapse, try to:  Avoid drinking alcohol. Drinking lowers your chances of successfully quitting.  Reduce the amount of caffeine you consume. Once you quit smoking, the amount of caffeine in your body increases and can give you symptoms, such as a rapid heartbeat, sweating, and anxiety.  Avoid smokers because they can make you want to smoke.  Do not let weight gain distract you. Many smokers will gain weight when they quit, usually less than 10 pounds. Eat a healthy diet and stay active. You can always lose the weight gained after you quit.  Find ways to improve your mood other than smoking. FOR MORE INFORMATION  www.smokefree.gov  Document Released: 02/14/2001 Document Revised: 07/07/2013 Document Reviewed: 06/01/2011 [employer] Patient Information 2015 [employer]. This information is not intended to replace advice given to you by your health care provider. Make sure you discuss any questions you have with your health care provider.  

## 2014-10-05 ENCOUNTER — Other Ambulatory Visit: Payer: Medicare Other

## 2014-10-05 ENCOUNTER — Ambulatory Visit: Payer: Medicare Other

## 2014-10-07 ENCOUNTER — Encounter (HOSPITAL_COMMUNITY): Payer: Self-pay | Admitting: Cardiovascular Disease

## 2014-10-08 ENCOUNTER — Other Ambulatory Visit (HOSPITAL_BASED_OUTPATIENT_CLINIC_OR_DEPARTMENT_OTHER): Payer: Medicare Other

## 2014-10-08 DIAGNOSIS — C3411 Malignant neoplasm of upper lobe, right bronchus or lung: Secondary | ICD-10-CM

## 2014-10-08 DIAGNOSIS — C3491 Malignant neoplasm of unspecified part of right bronchus or lung: Secondary | ICD-10-CM

## 2014-10-08 LAB — CBC WITH DIFFERENTIAL/PLATELET
BASO%: 1 % (ref 0.0–2.0)
BASOS ABS: 0 10*3/uL (ref 0.0–0.1)
EOS%: 0.6 % (ref 0.0–7.0)
Eosinophils Absolute: 0 10*3/uL (ref 0.0–0.5)
HEMATOCRIT: 36.1 % — AB (ref 38.4–49.9)
HGB: 11.5 g/dL — ABNORMAL LOW (ref 13.0–17.1)
LYMPH%: 13 % — ABNORMAL LOW (ref 14.0–49.0)
MCH: 26.5 pg — ABNORMAL LOW (ref 27.2–33.4)
MCHC: 31.8 g/dL — ABNORMAL LOW (ref 32.0–36.0)
MCV: 83.4 fL (ref 79.3–98.0)
MONO#: 0 10*3/uL — AB (ref 0.1–0.9)
MONO%: 1.2 % (ref 0.0–14.0)
NEUT#: 1.2 10*3/uL — ABNORMAL LOW (ref 1.5–6.5)
NEUT%: 84.2 % — AB (ref 39.0–75.0)
Platelets: 144 10*3/uL (ref 140–400)
RBC: 4.33 10*6/uL (ref 4.20–5.82)
RDW: 34.3 % — AB (ref 11.0–14.6)
WBC: 1.5 10*3/uL — ABNORMAL LOW (ref 4.0–10.3)
lymph#: 0.2 10*3/uL — ABNORMAL LOW (ref 0.9–3.3)

## 2014-10-08 LAB — COMPREHENSIVE METABOLIC PANEL (CC13)
ALBUMIN: 3.8 g/dL (ref 3.5–5.0)
ALT: 15 U/L (ref 0–55)
AST: 32 U/L (ref 5–34)
Alkaline Phosphatase: 68 U/L (ref 40–150)
Anion Gap: 4 mEq/L (ref 3–11)
BILIRUBIN TOTAL: 0.47 mg/dL (ref 0.20–1.20)
BUN: 14 mg/dL (ref 7.0–26.0)
CHLORIDE: 105 meq/L (ref 98–109)
CO2: 27 mEq/L (ref 22–29)
CREATININE: 0.8 mg/dL (ref 0.7–1.3)
Calcium: 9.1 mg/dL (ref 8.4–10.4)
EGFR: 88 mL/min/{1.73_m2} — ABNORMAL LOW (ref >90)
GLUCOSE: 116 mg/dL (ref 70–140)
Potassium: 4.4 mEq/L (ref 3.5–5.1)
SODIUM: 137 meq/L (ref 136–145)
TOTAL PROTEIN: 6.6 g/dL (ref 6.4–8.3)

## 2014-10-15 ENCOUNTER — Other Ambulatory Visit (HOSPITAL_BASED_OUTPATIENT_CLINIC_OR_DEPARTMENT_OTHER): Payer: Medicare Other

## 2014-10-15 DIAGNOSIS — C3491 Malignant neoplasm of unspecified part of right bronchus or lung: Secondary | ICD-10-CM

## 2014-10-15 LAB — CBC WITH DIFFERENTIAL/PLATELET
BASO%: 0 % (ref 0.0–2.0)
Basophils Absolute: 0 10*3/uL (ref 0.0–0.1)
EOS%: 0 % (ref 0.0–7.0)
Eosinophils Absolute: 0 10*3/uL (ref 0.0–0.5)
HCT: 33.8 % — ABNORMAL LOW (ref 38.4–49.9)
HGB: 10.8 g/dL — ABNORMAL LOW (ref 13.0–17.1)
LYMPH#: 0.5 10*3/uL — AB (ref 0.9–3.3)
LYMPH%: 15.2 % (ref 14.0–49.0)
MCH: 27.1 pg — ABNORMAL LOW (ref 27.2–33.4)
MCHC: 32 g/dL (ref 32.0–36.0)
MCV: 84.9 fL (ref 79.3–98.0)
MONO#: 0.6 10*3/uL (ref 0.1–0.9)
MONO%: 16.7 % — ABNORMAL HIGH (ref 0.0–14.0)
NEUT#: 2.3 10*3/uL (ref 1.5–6.5)
NEUT%: 68.1 % (ref 39.0–75.0)
NRBC: 0 % (ref 0–0)
Platelets: 71 10*3/uL — ABNORMAL LOW (ref 140–400)
RBC: 3.98 10*6/uL — AB (ref 4.20–5.82)
RDW: 30.3 % — ABNORMAL HIGH (ref 11.0–14.6)
WBC: 3.4 10*3/uL — ABNORMAL LOW (ref 4.0–10.3)

## 2014-10-15 LAB — COMPREHENSIVE METABOLIC PANEL (CC13)
ALK PHOS: 72 U/L (ref 40–150)
ALT: 23 U/L (ref 0–55)
AST: 41 U/L — ABNORMAL HIGH (ref 5–34)
Albumin: 3.6 g/dL (ref 3.5–5.0)
Anion Gap: 5 mEq/L (ref 3–11)
BILIRUBIN TOTAL: 0.32 mg/dL (ref 0.20–1.20)
BUN: 19.3 mg/dL (ref 7.0–26.0)
CO2: 26 mEq/L (ref 22–29)
CREATININE: 1.1 mg/dL (ref 0.7–1.3)
Calcium: 8.8 mg/dL (ref 8.4–10.4)
Chloride: 106 mEq/L (ref 98–109)
EGFR: 64 mL/min/{1.73_m2} — AB (ref >90)
GLUCOSE: 89 mg/dL (ref 70–140)
POTASSIUM: 4.3 meq/L (ref 3.5–5.1)
SODIUM: 138 meq/L (ref 136–145)
Total Protein: 6.4 g/dL (ref 6.4–8.3)

## 2014-10-22 ENCOUNTER — Telehealth: Payer: Self-pay | Admitting: Internal Medicine

## 2014-10-22 ENCOUNTER — Ambulatory Visit (HOSPITAL_BASED_OUTPATIENT_CLINIC_OR_DEPARTMENT_OTHER): Payer: Medicare Other | Admitting: Physician Assistant

## 2014-10-22 ENCOUNTER — Other Ambulatory Visit (HOSPITAL_BASED_OUTPATIENT_CLINIC_OR_DEPARTMENT_OTHER): Payer: Medicare Other

## 2014-10-22 ENCOUNTER — Encounter: Payer: Self-pay | Admitting: Physician Assistant

## 2014-10-22 ENCOUNTER — Ambulatory Visit (HOSPITAL_BASED_OUTPATIENT_CLINIC_OR_DEPARTMENT_OTHER): Payer: Medicare Other

## 2014-10-22 VITALS — BP 144/80 | HR 83 | Temp 97.9°F | Resp 16 | Ht 68.5 in | Wt 190.3 lb

## 2014-10-22 DIAGNOSIS — J449 Chronic obstructive pulmonary disease, unspecified: Secondary | ICD-10-CM

## 2014-10-22 DIAGNOSIS — C3491 Malignant neoplasm of unspecified part of right bronchus or lung: Secondary | ICD-10-CM

## 2014-10-22 DIAGNOSIS — C3411 Malignant neoplasm of upper lobe, right bronchus or lung: Secondary | ICD-10-CM | POA: Diagnosis not present

## 2014-10-22 DIAGNOSIS — C7931 Secondary malignant neoplasm of brain: Secondary | ICD-10-CM

## 2014-10-22 DIAGNOSIS — Z5111 Encounter for antineoplastic chemotherapy: Secondary | ICD-10-CM

## 2014-10-22 LAB — CBC WITH DIFFERENTIAL/PLATELET
BASO%: 0.4 % (ref 0.0–2.0)
Basophils Absolute: 0 10*3/uL (ref 0.0–0.1)
EOS%: 0.5 % (ref 0.0–7.0)
Eosinophils Absolute: 0 10*3/uL (ref 0.0–0.5)
HCT: 35 % — ABNORMAL LOW (ref 38.4–49.9)
HEMOGLOBIN: 11.2 g/dL — AB (ref 13.0–17.1)
LYMPH%: 12.5 % — ABNORMAL LOW (ref 14.0–49.0)
MCH: 27.7 pg (ref 27.2–33.4)
MCHC: 32.2 g/dL (ref 32.0–36.0)
MCV: 86.2 fL (ref 79.3–98.0)
MONO#: 0.4 10*3/uL (ref 0.1–0.9)
MONO%: 12.4 % (ref 0.0–14.0)
NEUT%: 74.2 % (ref 39.0–75.0)
NEUTROS ABS: 2.6 10*3/uL (ref 1.5–6.5)
PLATELETS: 267 10*3/uL (ref 140–400)
RBC: 4.05 10*6/uL — ABNORMAL LOW (ref 4.20–5.82)
RDW: 33.7 % — AB (ref 11.0–14.6)
WBC: 3.5 10*3/uL — AB (ref 4.0–10.3)
lymph#: 0.4 10*3/uL — ABNORMAL LOW (ref 0.9–3.3)

## 2014-10-22 LAB — COMPREHENSIVE METABOLIC PANEL (CC13)
ALBUMIN: 3.6 g/dL (ref 3.5–5.0)
ALK PHOS: 67 U/L (ref 40–150)
ALT: 18 U/L (ref 0–55)
AST: 32 U/L (ref 5–34)
Anion Gap: 11 mEq/L (ref 3–11)
BILIRUBIN TOTAL: 0.32 mg/dL (ref 0.20–1.20)
BUN: 16.5 mg/dL (ref 7.0–26.0)
CO2: 21 mEq/L — ABNORMAL LOW (ref 22–29)
Calcium: 9.4 mg/dL (ref 8.4–10.4)
Chloride: 107 mEq/L (ref 98–109)
Creatinine: 0.9 mg/dL (ref 0.7–1.3)
EGFR: 85 mL/min/{1.73_m2} — AB (ref >90)
Glucose: 115 mg/dl (ref 70–140)
Potassium: 4.2 mEq/L (ref 3.5–5.1)
SODIUM: 139 meq/L (ref 136–145)
TOTAL PROTEIN: 6.4 g/dL (ref 6.4–8.3)

## 2014-10-22 MED ORDER — SODIUM CHLORIDE 0.9 % IV SOLN
515.0000 mg | Freq: Once | INTRAVENOUS | Status: AC
  Administered 2014-10-22: 520 mg via INTRAVENOUS
  Filled 2014-10-22: qty 52

## 2014-10-22 MED ORDER — SODIUM CHLORIDE 0.9 % IV SOLN
Freq: Once | INTRAVENOUS | Status: AC
  Administered 2014-10-22: 12:00:00 via INTRAVENOUS
  Filled 2014-10-22: qty 8

## 2014-10-22 MED ORDER — CYANOCOBALAMIN 1000 MCG/ML IJ SOLN
INTRAMUSCULAR | Status: AC
  Filled 2014-10-22: qty 1

## 2014-10-22 MED ORDER — PEMETREXED DISODIUM CHEMO INJECTION 500 MG
500.0000 mg/m2 | Freq: Once | INTRAVENOUS | Status: AC
  Administered 2014-10-22: 1000 mg via INTRAVENOUS
  Filled 2014-10-22: qty 40

## 2014-10-22 MED ORDER — SODIUM CHLORIDE 0.9 % IV SOLN
Freq: Once | INTRAVENOUS | Status: AC
  Administered 2014-10-22: 12:00:00 via INTRAVENOUS

## 2014-10-22 MED ORDER — CYANOCOBALAMIN 1000 MCG/ML IJ SOLN
1000.0000 ug | Freq: Once | INTRAMUSCULAR | Status: AC
  Administered 2014-10-22: 1000 ug via INTRAMUSCULAR

## 2014-10-22 NOTE — Telephone Encounter (Signed)
Gave and printed appt sched and avs for pt for Aug adn Sept....gv barium

## 2014-10-22 NOTE — Progress Notes (Addendum)
[facility] [phone]   [phone]  OFFICE PROGRESS NOTE  Leonard Downing, MD [address] [place] [address]   DIAGNOSIS: Adenocarcinoma of right lung  Staging form: Lung, AJCC 7th Edition  Clinical stage from 08/13/2014: Stage IV (T2a, N2, M1a) - Signed by Curt Bears, MD on 08/15/2014  PRIOR THERAPY: None  CURRENT THERAPY: Systemic chemotherapy carboplatin for AUC of 5 and Alimta at 500 mg/m given every 3 weeks. Status post 2 cycles  INTERVAL HISTORY: Steve Baker 74 y.o. male returns to the clinic today for follow-up visit accompanied by his wife. The patient is tolerating his current systemic chemotherapy with carboplatin and Alimta fairly well with no significant adverse effects. He denied having any significant fatigue or weakness. He denied having any nausea or vomiting. He has no fever or chills. The patient denied having any significant chest pain but continues to have shortness of breath with exertion with no cough or hemoptysis. He is here today to start cycle #3 of his systemic chemotherapy. He was found initially to have a concerning lesion on the MRI of the brain but repeat imaging studies showed decrease on this lesion. He is followed by observation by Dr. Tammi Klippel for his questionable brain lesion.  MEDICAL HISTORY: Past Medical History  Diagnosis Date  . Hyperlipidemia   . Irregular heart beat     Pt stated his heart skips a beat every now and then  . Shortness of breath dyspnea     with ambulation  . Peripheral vascular disease   . Anxiety   . Coronary artery disease   . Hypertension     lisinopril  . Dysrhythmia   . COPD (chronic obstructive pulmonary disease)   . History of bronchitis   . Anemia   . 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 Brain cancer     brain metastasis  . Lung cancer     adenocarcinoma of right lung    ALLERGIES:  has No Known Allergies.  MEDICATIONS:  Current Outpatient  Prescriptions  Medication Sig Dispense Refill  . aspirin EC 81 MG tablet Take 81 mg by mouth daily.    Marland Kitchen dexamethasone (DECADRON) 4 MG tablet 4 mg po bid, the day before, day of and day after chemo 40 tablet 1  . FeFum-FePoly-FA-B Cmp-C-Biot (INTEGRA PLUS) CAPS Take 1 capsule by mouth every morning. 30 capsule 2  . folic acid (FOLVITE) 1 MG tablet Take 1 tablet (1 mg total) by mouth daily. 30 tablet 4  . furosemide (LASIX) 40 MG tablet Take 40 mg by mouth daily as needed for edema.   0  . lisinopril (PRINIVIL,ZESTRIL) 20 MG tablet Take 20 mg by mouth daily.    Marland Kitchen LORazepam (ATIVAN) 1 MG tablet Take 1 tablet (1 mg total) by mouth every 8 (eight) hours. (Patient not taking: Reported on 09/21/2014) 30 tablet 0  . pravastatin (PRAVACHOL) 40 MG tablet Take 40 mg by mouth daily.    . prochlorperazine (COMPAZINE) 10 MG tablet Take 1 tablet (10 mg total) by mouth every 6 (six) hours as needed for nausea or vomiting. (Patient not taking: Reported on 09/21/2014) 30 tablet 0  . Tiotropium Bromide Monohydrate (SPIRIVA RESPIMAT) 2.5 MCG/ACT AERS Inhale 2 puffs into the lungs daily. 1 Inhaler 4   No current facility-administered medications for this visit.    SURGICAL HISTORY:  Past Surgical History  Procedure Laterality Date  . Cholecystectomy    . Heart bypass    .  Cardiac catheterization  2001  . Skin cancer excision N/A     Nose  . Abdominal aortic aneurysm repair    . Eye surgery Bilateral     cataract removal  . Colonoscopy w/ polypectomy    . Cardiac catheterization N/A 07/16/2014    Procedure: Right/Left Heart Cath and Coronary Angiography;  Surgeon: Burnell Blanks, MD;  Location: [facility];  Service: Cardiovascular;  Laterality: N/A;  . Coronary artery bypass graft    . Video bronchoscopy with endobronchial navigation N/A 07/29/2014    Procedure: VIDEO BRONCHOSCOPY WITH ENDOBRONCHIAL NAVIGATION;  Surgeon: Collene Gobble, MD;  Location: [facility];  Service: Thoracic;  Laterality:  N/A;  . Endarterectomy femoral Left 08/19/2014    Procedure: PROFUNDA FEMORIS ENDARTERECTOMY ;  Surgeon: Rosetta Posner, MD;  Location: [facility];  Service: Vascular;  Laterality: Left;  . Femoral-popliteal bypass graft Left 08/19/2014    Procedure: FEMORAL-POPLITEAL ARTERY BYPASS GRAFT WITH PROPATEN GORTEX ;  Surgeon: Rosetta Posner, MD;  Location: [facility];  Service: Vascular;  Laterality: Left;  . Patch angioplasty Left 08/19/2014    Procedure: LEFT PROFUNDA FEMORIS PATCH ANGIOPLASTY USING HEMASHIELD PLATINUM FINESSE PATCH;  Surgeon: Rosetta Posner, MD;  Location: [facility];  Service: Vascular;  Laterality: Left;  . Peripheral vascular catheterization N/A 08/05/2014    Procedure: Abdominal Aortogram w/Lower Extremity;  Surgeon: Wellington Hampshire, MD;  Location: [facility];  Service: Cardiovascular;  Laterality: N/A;    REVIEW OF SYSTEMS:  A comprehensive review of systems was negative.   PHYSICAL EXAMINATION: General appearance: alert, cooperative and no distress Head: Normocephalic, without obvious abnormality, atraumatic Neck: no adenopathy, no JVD, supple, symmetrical, trachea midline and thyroid not enlarged, symmetric, no tenderness/mass/nodules Lymph nodes: Cervical, supraclavicular, and axillary nodes normal. Resp: clear to auscultation bilaterally Back: symmetric, no curvature. ROM normal. No CVA tenderness. Cardio: regular rate and rhythm, S1, S2 normal, no murmur, click, rub or gallop GI: soft, non-tender; bowel sounds normal; no masses,  no organomegaly Extremities: extremities normal, atraumatic, no cyanosis or edema  ECOG PERFORMANCE STATUS: 1 - Symptomatic but completely ambulatory  Blood pressure 144/80, pulse 83, temperature 97.9 F (36.6 C), temperature source Oral, resp. rate 16, height 5' 8.5" (1.74 m), weight 190 lb 4.8 oz (86.32 kg), SpO2 96 %.  LABORATORY DATA: Lab Results  Component Value Date   WBC 3.5* 10/22/2014   HGB 11.2* 10/22/2014   HCT 35.0* 10/22/2014   MCV 86.2  10/22/2014   PLT 267 10/22/2014      Chemistry      Component Value Date/Time   NA 139 10/22/2014 1003   NA 133* 08/21/2014 0312   K 4.2 10/22/2014 1003   K 4.1 08/21/2014 0312   CL 101 08/21/2014 0312   CO2 21* 10/22/2014 1003   CO2 25 08/21/2014 0312   BUN 16.5 10/22/2014 1003   BUN 8 08/21/2014 0312   CREATININE 0.9 10/22/2014 1003   CREATININE 0.96 08/21/2014 0312      Component Value Date/Time   CALCIUM 9.4 10/22/2014 1003   CALCIUM 8.5* 08/21/2014 0312   ALKPHOS 67 10/22/2014 1003   ALKPHOS 82 08/18/2014 0945   AST 32 10/22/2014 1003   AST 29 08/18/2014 0945   ALT 18 10/22/2014 1003   ALT 9* 08/18/2014 0945   BILITOT 0.32 10/22/2014 1003   BILITOT 0.4 08/18/2014 0945       RADIOGRAPHIC STUDIES: No results found.  ASSESSMENT AND PLAN: This is a very pleasant 74  years old white male with a stage IV non-small cell lung cancer currently undergoing systemic chemotherapy with carboplatin and Alimta status post 2 cycles. The patient tolerated the first cycle well.He will proceed with cycle #3 today as scheduled. The patient was discussed with and also seen by Dr. Julien Nordmann. He will return for a follow-up visit in 3 weeks before starting cycle #4 with a restaging CT scan of his chest, abdomen and pelvis to re-evaluate his disease . The patient was advised to call immediately if he has any concerning symptoms in the interval. The patient voices understanding of current disease status and treatment options and is in agreement with the current care plan.  All questions were answered. The patient knows to call the clinic with any problems, questions or concerns. We can certainly see the patient much sooner if necessary.  Carlton Adam, PA-C 10/22/2014  ADDENDUM: Hematology/Oncology Attending: I had a face to face encounter with the patient. I recommended his care plan. This is a very pleasant 74 years old white male with a stage IV non-small cell lung cancer currently  undergoing treatment with systemic chemotherapy with carboplatin and Alimta status post 2 cycles. The patient is tolerating his treatment well with no significant adverse effects. He denied having any fever or chills. He has no nausea or vomiting. I recommended for him to proceed with cycle #3 today as scheduled. The patient would come back for follow-up visit in 3 weeks for evaluation after repeating CT scan of the chest for restaging of his disease. He was advised to call immediately if he has any concerning symptoms in the interval.  Disclaimer: This note was dictated with voice recognition software. Similar sounding words can inadvertently be transcribed and may not be corrected upon review. Eilleen Kempf., MD 10/28/2014

## 2014-10-22 NOTE — Patient Instructions (Signed)
[facility] Discharge Instructions for Patients Receiving Chemotherapy  Today you received the following chemotherapy agents:  Alimta and Carboplatin  To help prevent nausea and vomiting after your treatment, we encourage you to take your nausea medication as ordered per MD.   If you develop nausea and vomiting that is not controlled by your nausea medication, call the clinic.   BELOW ARE SYMPTOMS THAT SHOULD BE REPORTED IMMEDIATELY:  *FEVER GREATER THAN 100.5 F  *CHILLS WITH OR WITHOUT FEVER  NAUSEA AND VOMITING THAT IS NOT CONTROLLED WITH YOUR NAUSEA MEDICATION  *UNUSUAL SHORTNESS OF BREATH  *UNUSUAL BRUISING OR BLEEDING  TENDERNESS IN MOUTH AND THROAT WITH OR WITHOUT PRESENCE OF ULCERS  *URINARY PROBLEMS  *BOWEL PROBLEMS  UNUSUAL RASH Items with * indicate a potential emergency and should be followed up as soon as possible.  Feel free to call the clinic you have any questions or concerns. The clinic phone number is [phone].  Please show the [facility] at check-in to the Emergency Department and triage nurse.

## 2014-10-28 NOTE — Patient Instructions (Signed)
Continue weekly labs Follow up in 3 weeks prior to your next scheduled cycle of chemotherapy with a restaging CT scan of your chest, abdomen and pelvis to re-evaluate your disease

## 2014-10-29 ENCOUNTER — Other Ambulatory Visit (HOSPITAL_BASED_OUTPATIENT_CLINIC_OR_DEPARTMENT_OTHER): Payer: Medicare Other

## 2014-10-29 DIAGNOSIS — C3411 Malignant neoplasm of upper lobe, right bronchus or lung: Secondary | ICD-10-CM

## 2014-10-29 DIAGNOSIS — C3491 Malignant neoplasm of unspecified part of right bronchus or lung: Secondary | ICD-10-CM

## 2014-10-29 LAB — CBC WITH DIFFERENTIAL/PLATELET
BASO%: 0.3 % (ref 0.0–2.0)
Basophils Absolute: 0 10*3/uL (ref 0.0–0.1)
EOS ABS: 0 10*3/uL (ref 0.0–0.5)
EOS%: 0.6 % (ref 0.0–7.0)
HCT: 33.8 % — ABNORMAL LOW (ref 38.4–49.9)
HGB: 10.7 g/dL — ABNORMAL LOW (ref 13.0–17.1)
LYMPH%: 20.2 % (ref 14.0–49.0)
MCH: 28.1 pg (ref 27.2–33.4)
MCHC: 31.7 g/dL — AB (ref 32.0–36.0)
MCV: 88.7 fL (ref 79.3–98.0)
MONO#: 0.2 10*3/uL (ref 0.1–0.9)
MONO%: 5.4 % (ref 0.0–14.0)
NEUT%: 73.5 % (ref 39.0–75.0)
NEUTROS ABS: 2.5 10*3/uL (ref 1.5–6.5)
PLATELETS: 153 10*3/uL (ref 140–400)
RBC: 3.81 10*6/uL — AB (ref 4.20–5.82)
RDW: 27.8 % — ABNORMAL HIGH (ref 11.0–14.6)
WBC: 3.4 10*3/uL — AB (ref 4.0–10.3)
lymph#: 0.7 10*3/uL — ABNORMAL LOW (ref 0.9–3.3)
nRBC: 0 % (ref 0–0)

## 2014-10-29 LAB — COMPREHENSIVE METABOLIC PANEL (CC13)
ALT: 17 U/L (ref 0–55)
ANION GAP: 7 meq/L (ref 3–11)
AST: 30 U/L (ref 5–34)
Albumin: 3.4 g/dL — ABNORMAL LOW (ref 3.5–5.0)
Alkaline Phosphatase: 62 U/L (ref 40–150)
BUN: 24.5 mg/dL (ref 7.0–26.0)
CHLORIDE: 107 meq/L (ref 98–109)
CO2: 28 meq/L (ref 22–29)
CREATININE: 1 mg/dL (ref 0.7–1.3)
Calcium: 8.8 mg/dL (ref 8.4–10.4)
EGFR: 74 mL/min/{1.73_m2} — ABNORMAL LOW (ref >90)
Glucose: 87 mg/dl (ref 70–140)
Potassium: 4.3 mEq/L (ref 3.5–5.1)
Sodium: 142 mEq/L (ref 136–145)
Total Bilirubin: 0.3 mg/dL (ref 0.20–1.20)
Total Protein: 6 g/dL — ABNORMAL LOW (ref 6.4–8.3)

## 2014-11-05 ENCOUNTER — Other Ambulatory Visit (HOSPITAL_BASED_OUTPATIENT_CLINIC_OR_DEPARTMENT_OTHER): Payer: Medicare Other

## 2014-11-05 ENCOUNTER — Ambulatory Visit (HOSPITAL_COMMUNITY)
Admission: RE | Admit: 2014-11-05 | Discharge: 2014-11-05 | Disposition: A | Discharge status: Home / Self Care | Payer: Medicare Other | Source: Ambulatory Visit | Attending: Physician Assistant | Admitting: Physician Assistant

## 2014-11-05 ENCOUNTER — Encounter (HOSPITAL_COMMUNITY): Payer: Self-pay

## 2014-11-05 DIAGNOSIS — C3491 Malignant neoplasm of unspecified part of right bronchus or lung: Secondary | ICD-10-CM | POA: Diagnosis present

## 2014-11-05 DIAGNOSIS — D739 Disease of spleen, unspecified: Secondary | ICD-10-CM | POA: Diagnosis not present

## 2014-11-05 DIAGNOSIS — R911 Solitary pulmonary nodule: Secondary | ICD-10-CM | POA: Insufficient documentation

## 2014-11-05 DIAGNOSIS — C3411 Malignant neoplasm of upper lobe, right bronchus or lung: Secondary | ICD-10-CM

## 2014-11-05 LAB — CBC WITH DIFFERENTIAL/PLATELET
BASO%: 0.3 % (ref 0.0–2.0)
BASOS ABS: 0 10*3/uL (ref 0.0–0.1)
EOS ABS: 0.1 10*3/uL (ref 0.0–0.5)
EOS%: 1.4 % (ref 0.0–7.0)
HEMATOCRIT: 37.8 % — AB (ref 38.4–49.9)
HGB: 12.1 g/dL — ABNORMAL LOW (ref 13.0–17.1)
LYMPH%: 20.2 % (ref 14.0–49.0)
MCH: 28.9 pg (ref 27.2–33.4)
MCHC: 32 g/dL (ref 32.0–36.0)
MCV: 90.2 fL (ref 79.3–98.0)
MONO#: 0.6 10*3/uL (ref 0.1–0.9)
MONO%: 16.1 % — AB (ref 0.0–14.0)
NEUT%: 62 % (ref 39.0–75.0)
NEUTROS ABS: 2.2 10*3/uL (ref 1.5–6.5)
NRBC: 0 % (ref 0–0)
PLATELETS: 60 10*3/uL — AB (ref 140–400)
RBC: 4.19 10*6/uL — AB (ref 4.20–5.82)
RDW: 27.3 % — AB (ref 11.0–14.6)
WBC: 3.5 10*3/uL — ABNORMAL LOW (ref 4.0–10.3)
lymph#: 0.7 10*3/uL — ABNORMAL LOW (ref 0.9–3.3)

## 2014-11-05 LAB — COMPREHENSIVE METABOLIC PANEL (CC13)
ALBUMIN: 3.7 g/dL (ref 3.5–5.0)
ALK PHOS: 72 U/L (ref 40–150)
ALT: 22 U/L (ref 0–55)
ANION GAP: 6 meq/L (ref 3–11)
AST: 35 U/L — ABNORMAL HIGH (ref 5–34)
BILIRUBIN TOTAL: 0.51 mg/dL (ref 0.20–1.20)
BUN: 19 mg/dL (ref 7.0–26.0)
CALCIUM: 9.4 mg/dL (ref 8.4–10.4)
CO2: 28 meq/L (ref 22–29)
CREATININE: 0.8 mg/dL (ref 0.7–1.3)
Chloride: 109 mEq/L (ref 98–109)
EGFR: 86 mL/min/{1.73_m2} — AB (ref >90)
Glucose: 67 mg/dl — ABNORMAL LOW (ref 70–140)
Potassium: 5.5 mEq/L — ABNORMAL HIGH (ref 3.5–5.1)
Sodium: 143 mEq/L (ref 136–145)
TOTAL PROTEIN: 6.8 g/dL (ref 6.4–8.3)

## 2014-11-05 LAB — TECHNOLOGIST REVIEW

## 2014-11-05 MED ORDER — IOHEXOL 300 MG/ML  SOLN
100.0000 mL | Freq: Once | INTRAMUSCULAR | Status: AC | PRN
  Administered 2014-11-05: 100 mL via INTRAVENOUS

## 2014-11-12 ENCOUNTER — Ambulatory Visit: Payer: Medicare Other

## 2014-11-12 ENCOUNTER — Encounter: Payer: Self-pay | Admitting: Physician Assistant

## 2014-11-12 ENCOUNTER — Ambulatory Visit (HOSPITAL_BASED_OUTPATIENT_CLINIC_OR_DEPARTMENT_OTHER): Payer: Medicare Other | Admitting: Physician Assistant

## 2014-11-12 ENCOUNTER — Telehealth: Payer: Self-pay | Admitting: Physician Assistant

## 2014-11-12 ENCOUNTER — Ambulatory Visit (HOSPITAL_BASED_OUTPATIENT_CLINIC_OR_DEPARTMENT_OTHER): Payer: Medicare Other

## 2014-11-12 ENCOUNTER — Other Ambulatory Visit (HOSPITAL_BASED_OUTPATIENT_CLINIC_OR_DEPARTMENT_OTHER): Payer: Medicare Other

## 2014-11-12 VITALS — BP 141/76 | HR 72 | Temp 98.1°F | Resp 18 | Ht 68.5 in | Wt 189.0 lb

## 2014-11-12 DIAGNOSIS — C7931 Secondary malignant neoplasm of brain: Secondary | ICD-10-CM

## 2014-11-12 DIAGNOSIS — Z5111 Encounter for antineoplastic chemotherapy: Secondary | ICD-10-CM | POA: Diagnosis not present

## 2014-11-12 DIAGNOSIS — C3411 Malignant neoplasm of upper lobe, right bronchus or lung: Secondary | ICD-10-CM

## 2014-11-12 DIAGNOSIS — C3491 Malignant neoplasm of unspecified part of right bronchus or lung: Secondary | ICD-10-CM

## 2014-11-12 LAB — COMPREHENSIVE METABOLIC PANEL (CC13)
ALT: 26 U/L (ref 0–55)
ANION GAP: 9 meq/L (ref 3–11)
AST: 50 U/L — ABNORMAL HIGH (ref 5–34)
Albumin: 3.8 g/dL (ref 3.5–5.0)
Alkaline Phosphatase: 77 U/L (ref 40–150)
BUN: 12.6 mg/dL (ref 7.0–26.0)
CHLORIDE: 106 meq/L (ref 98–109)
CO2: 24 meq/L (ref 22–29)
CREATININE: 0.9 mg/dL (ref 0.7–1.3)
Calcium: 9.4 mg/dL (ref 8.4–10.4)
EGFR: 85 mL/min/{1.73_m2} — AB (ref >90)
GLUCOSE: 95 mg/dL (ref 70–140)
Potassium: 4.2 mEq/L (ref 3.5–5.1)
SODIUM: 140 meq/L (ref 136–145)
Total Bilirubin: 0.51 mg/dL (ref 0.20–1.20)
Total Protein: 6.9 g/dL (ref 6.4–8.3)

## 2014-11-12 LAB — CBC WITH DIFFERENTIAL/PLATELET
BASO%: 0.4 % (ref 0.0–2.0)
Basophils Absolute: 0 10*3/uL (ref 0.0–0.1)
EOS%: 1.8 % (ref 0.0–7.0)
Eosinophils Absolute: 0.1 10*3/uL (ref 0.0–0.5)
HCT: 38.2 % — ABNORMAL LOW (ref 38.4–49.9)
HGB: 12.1 g/dL — ABNORMAL LOW (ref 13.0–17.1)
LYMPH%: 25.4 % (ref 14.0–49.0)
MCH: 28.9 pg (ref 27.2–33.4)
MCHC: 31.7 g/dL — AB (ref 32.0–36.0)
MCV: 91.2 fL (ref 79.3–98.0)
MONO#: 0.6 10*3/uL (ref 0.1–0.9)
MONO%: 20.3 % — AB (ref 0.0–14.0)
NEUT%: 52.1 % (ref 39.0–75.0)
NEUTROS ABS: 1.4 10*3/uL — AB (ref 1.5–6.5)
PLATELETS: 237 10*3/uL (ref 140–400)
RBC: 4.19 10*6/uL — AB (ref 4.20–5.82)
RDW: 25.7 % — ABNORMAL HIGH (ref 11.0–14.6)
WBC: 2.8 10*3/uL — AB (ref 4.0–10.3)
lymph#: 0.7 10*3/uL — ABNORMAL LOW (ref 0.9–3.3)

## 2014-11-12 MED ORDER — SODIUM CHLORIDE 0.9 % IV SOLN
515.0000 mg | Freq: Once | INTRAVENOUS | Status: AC
  Administered 2014-11-12: 520 mg via INTRAVENOUS
  Filled 2014-11-12: qty 52

## 2014-11-12 MED ORDER — SODIUM CHLORIDE 0.9 % IV SOLN
Freq: Once | INTRAVENOUS | Status: AC
  Administered 2014-11-12: 13:00:00 via INTRAVENOUS
  Filled 2014-11-12: qty 8

## 2014-11-12 MED ORDER — SODIUM CHLORIDE 0.9 % IV SOLN
500.0000 mg/m2 | Freq: Once | INTRAVENOUS | Status: AC
  Administered 2014-11-12: 1000 mg via INTRAVENOUS
  Filled 2014-11-12: qty 40

## 2014-11-12 MED ORDER — SODIUM CHLORIDE 0.9 % IV SOLN
Freq: Once | INTRAVENOUS | Status: AC
  Administered 2014-11-12: 13:00:00 via INTRAVENOUS

## 2014-11-12 NOTE — Progress Notes (Signed)
OK to treat with WBC-2.8 and ANC-1.4 per Rollene Rotunda PA.

## 2014-11-12 NOTE — Patient Instructions (Signed)
[facility] Discharge Instructions for Patients Receiving Chemotherapy  Today you received the following chemotherapy agents:  Alimta and Carboplatin  To help prevent nausea and vomiting after your treatment, we encourage you to take your nausea medication as ordered per MD.   If you develop nausea and vomiting that is not controlled by your nausea medication, call the clinic.   BELOW ARE SYMPTOMS THAT SHOULD BE REPORTED IMMEDIATELY:  *FEVER GREATER THAN 100.5 F  *CHILLS WITH OR WITHOUT FEVER  NAUSEA AND VOMITING THAT IS NOT CONTROLLED WITH YOUR NAUSEA MEDICATION  *UNUSUAL SHORTNESS OF BREATH  *UNUSUAL BRUISING OR BLEEDING  TENDERNESS IN MOUTH AND THROAT WITH OR WITHOUT PRESENCE OF ULCERS  *URINARY PROBLEMS  *BOWEL PROBLEMS  UNUSUAL RASH Items with * indicate a potential emergency and should be followed up as soon as possible.  Feel free to call the clinic you have any questions or concerns. The clinic phone number is [phone].  Please show the [facility] at check-in to the Emergency Department and triage nurse.

## 2014-11-12 NOTE — Telephone Encounter (Signed)
Appointments made and avs to be printed in chemo

## 2014-11-12 NOTE — Progress Notes (Addendum)
[place] [phone]   [phone]  OFFICE PROGRESS NOTE  Leonard Downing, MD [address] [place] [address]   DIAGNOSIS: Adenocarcinoma of right lung  Staging form: Lung, AJCC 7th Edition  Clinical stage from 08/13/2014: Stage IV (T2a, N2, M1a) - Signed by Curt Bears, MD on 08/15/2014  PRIOR THERAPY: None  CURRENT THERAPY: Systemic chemotherapy carboplatin for AUC of 5 and Alimta at 500 mg/m given every 3 weeks. Status post 3 cycles  INTERVAL HISTORY: Steve Baker 74 y.o. male returns to the clinic today for follow-up visit accompanied by his wife. The patient is tolerating his current systemic chemotherapy with carboplatin and Alimta fairly well with no significant adverse effects. He denied having any significant fatigue or weakness. He denied having any nausea or vomiting. He has no fever or chills. The patient denied having any significant chest pain but continues to have shortness of breath with exertion with no cough or hemoptysis. He is here today to start cycle #4 of his systemic chemotherapy. He recently had a restaging CT scan of the chest, abdomen and pelvis and presents to discuss the results.  MEDICAL HISTORY: Past Medical History  Diagnosis Date  . Hyperlipidemia   . Irregular heart beat     Pt stated his heart skips a beat every now and then  . Shortness of breath dyspnea     with ambulation  . Peripheral vascular disease   . Anxiety   . Coronary artery disease   . Hypertension     lisinopril  . Dysrhythmia   . COPD (chronic obstructive pulmonary disease)   . History of bronchitis   . Anemia   . 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 Brain cancer     brain metastasis  . Lung cancer     adenocarcinoma of right lung    ALLERGIES:  has No Known Allergies.  MEDICATIONS:  Current Outpatient Prescriptions  Medication Sig Dispense Refill  . aspirin EC 81 MG tablet Take 81 mg by mouth daily.    Marland Kitchen  dexamethasone (DECADRON) 4 MG tablet 4 mg po bid, the day before, day of and day after chemo 40 tablet 1  . FeFum-FePoly-FA-B Cmp-C-Biot (INTEGRA PLUS) CAPS Take 1 capsule by mouth every morning. 30 capsule 2  . folic acid (FOLVITE) 1 MG tablet Take 1 tablet (1 mg total) by mouth daily. 30 tablet 4  . furosemide (LASIX) 40 MG tablet Take 40 mg by mouth daily as needed for edema.   0  . lisinopril (PRINIVIL,ZESTRIL) 20 MG tablet Take 20 mg by mouth daily.    Marland Kitchen LORazepam (ATIVAN) 1 MG tablet Take 1 tablet (1 mg total) by mouth every 8 (eight) hours. (Patient not taking: Reported on 09/21/2014) 30 tablet 0  . pravastatin (PRAVACHOL) 40 MG tablet Take 40 mg by mouth daily.    . prochlorperazine (COMPAZINE) 10 MG tablet Take 1 tablet (10 mg total) by mouth every 6 (six) hours as needed for nausea or vomiting. (Patient not taking: Reported on 09/21/2014) 30 tablet 0  . Tiotropium Bromide Monohydrate (SPIRIVA RESPIMAT) 2.5 MCG/ACT AERS Inhale 2 puffs into the lungs daily. 1 Inhaler 4   No current facility-administered medications for this visit.    SURGICAL HISTORY:  Past Surgical History  Procedure Laterality Date  . Cholecystectomy    . Heart bypass    . Cardiac catheterization  2001  . Skin cancer excision N/A  Nose  . Abdominal aortic aneurysm repair    . Eye surgery Bilateral     cataract removal  . Colonoscopy w/ polypectomy    . Cardiac catheterization N/A 07/16/2014    Procedure: Right/Left Heart Cath and Coronary Angiography;  Surgeon: Burnell Blanks, MD;  Location: [facility];  Service: Cardiovascular;  Laterality: N/A;  . Coronary artery bypass graft    . Video bronchoscopy with endobronchial navigation N/A 07/29/2014    Procedure: VIDEO BRONCHOSCOPY WITH ENDOBRONCHIAL NAVIGATION;  Surgeon: Collene Gobble, MD;  Location: [facility];  Service: Thoracic;  Laterality: N/A;  . Endarterectomy femoral Left 08/19/2014    Procedure: PROFUNDA FEMORIS ENDARTERECTOMY ;  Surgeon: Rosetta Posner, MD;  Location: [facility];  Service: Vascular;  Laterality: Left;  . Femoral-popliteal bypass graft Left 08/19/2014    Procedure: FEMORAL-POPLITEAL ARTERY BYPASS GRAFT WITH PROPATEN GORTEX ;  Surgeon: Rosetta Posner, MD;  Location: [facility];  Service: Vascular;  Laterality: Left;  . Patch angioplasty Left 08/19/2014    Procedure: LEFT PROFUNDA FEMORIS PATCH ANGIOPLASTY USING HEMASHIELD PLATINUM FINESSE PATCH;  Surgeon: Rosetta Posner, MD;  Location: [facility];  Service: Vascular;  Laterality: Left;  . Peripheral vascular catheterization N/A 08/05/2014    Procedure: Abdominal Aortogram w/Lower Extremity;  Surgeon: Wellington Hampshire, MD;  Location: [facility];  Service: Cardiovascular;  Laterality: N/A;    REVIEW OF SYSTEMS:  A comprehensive review of systems was negative.   PHYSICAL EXAMINATION: General appearance: alert, cooperative and no distress Head: Normocephalic, without obvious abnormality, atraumatic Neck: no adenopathy, no JVD, supple, symmetrical, trachea midline and thyroid not enlarged, symmetric, no tenderness/mass/nodules Lymph nodes: Cervical, supraclavicular, and axillary nodes normal. Resp: clear to auscultation bilaterally Back: symmetric, no curvature. ROM normal. No CVA tenderness. Cardio: regular rate and rhythm, S1, S2 normal, no murmur, click, rub or gallop GI: soft, non-tender; bowel sounds normal; no masses,  no organomegaly Extremities: extremities normal, atraumatic, no cyanosis or edema  ECOG PERFORMANCE STATUS: 1 - Symptomatic but completely ambulatory  Blood pressure 141/76, pulse 72, temperature 98.1 F (36.7 C), temperature source Oral, resp. rate 18, height 5' 8.5" (1.74 m), weight 189 lb (85.73 kg), SpO2 97 %.  LABORATORY DATA: Lab Results  Component Value Date   WBC 2.8* 11/12/2014   HGB 12.1* 11/12/2014   HCT 38.2* 11/12/2014   MCV 91.2 11/12/2014   PLT 237 11/12/2014      Chemistry      Component Value Date/Time   NA 140 11/12/2014 0929   NA  133* 08/21/2014 0312   K 4.2 11/12/2014 0929   K 4.1 08/21/2014 0312   CL 101 08/21/2014 0312   CO2 24 11/12/2014 0929   CO2 25 08/21/2014 0312   BUN 12.6 11/12/2014 0929   BUN 8 08/21/2014 0312   CREATININE 0.9 11/12/2014 0929   CREATININE 0.96 08/21/2014 0312      Component Value Date/Time   CALCIUM 9.4 11/12/2014 0929   CALCIUM 8.5* 08/21/2014 0312   ALKPHOS 77 11/12/2014 0929   ALKPHOS 82 08/18/2014 0945   AST 50* 11/12/2014 0929   AST 29 08/18/2014 0945   ALT 26 11/12/2014 0929   ALT 9* 08/18/2014 0945   BILITOT 0.51 11/12/2014 0929   BILITOT 0.4 08/18/2014 0945       RADIOGRAPHIC STUDIES: Ct Chest W Contrast  11/05/2014   CLINICAL DATA:  Patient with history of lung cancer on chemotherapy. Remote history of prostate cancer.  EXAM: CT CHEST, ABDOMEN,  AND PELVIS WITH CONTRAST  TECHNIQUE: Multidetector CT imaging of the chest, abdomen and pelvis was performed following the standard protocol during bolus administration of intravenous contrast.  CONTRAST:  139m OMNIPAQUE IOHEXOL 300 MG/ML  SOLN  COMPARISON:  Chest CT 06/29/2014; PET-CT 06/18/2014  FINDINGS: CT CHEST FINDINGS  Mediastinum/Nodes: Heterogeneous nodule within the right thyroid lobe. Slight interval decrease in size of right peritracheal lymph node measuring 12 mm, previously 14 mm (image 27; series 2). Normal heart size. No pericardial effusion. Aorta and main pulmonary artery normal in caliber. Coronary arterial vascular calcifications. Status post CABG procedure.  Lungs/Pleura: The central airways are patent. Interval decrease in size of right upper lobe mass measuring 3.3 x 2.4 cm, previously 4.0 x 4.7 cm (image 14; series 5). Interval decrease in size of left lower lobe spiculated nodule measuring 1.7 x 1.1 cm, previously 2.0 x 1.4 cm (image 46; series 5). No new or enlarging pulmonary nodules or masses are identified. There are multiple new metallic density foci within the left lower lobe (image 45; series 5). No  pleural effusion or pneumothorax.  CT ABDOMEN AND PELVIS FINDINGS  Hepatobiliary: Liver is normal in size and contour. No focal hepatic lesion is identified. The patient status post cholecystectomy. No intrahepatic or extrahepatic biliary ductal dilatation.  Pancreas: Unremarkable  Spleen: Nonspecific 9 mm low-attenuation lesion in the spleen (image 72; series 2).  Adrenals/Urinary Tract: The adrenal glands are normal. The kidneys enhance symmetrically with contrast. There is stable bilateral perinephric fat stranding. No hydronephrosis. Urinary bladder is unremarkable. Punctate bilateral nonobstructing renal stones.  Stomach/Bowel: Sigmoid colonic diverticulosis. No CT evidence to suggest acute diverticulitis. No abnormal bowel wall thickening or evidence for bowel obstruction. No free fluid or free intraperitoneal air. Small hiatal hernia.  Vascular/Lymphatic: Patient status post aortic graft which appears patent. Postsurgical changes within the left inguinal region. No retroperitoneal lymphadenopathy.  Other: Multiple seeds within the prostate.  Musculoskeletal: Lumbar spine degenerative changes. No aggressive or acute appearing osseous lesions.  IMPRESSION: Interval decrease in size right upper lobe mass and left lower lobe nodule.  Slight interval decrease in size of right peritracheal lymph node.  Nonspecific low-attenuation splenic lesion, likely benign in etiology. Recommend attention on followup.   Electronically Signed   By: DLovey NewcomerM.D.   On: 11/05/2014 15:03   Ct Abdomen Pelvis W Contrast  11/05/2014   CLINICAL DATA:  Patient with history of lung cancer on chemotherapy. Remote history of prostate cancer.  EXAM: CT CHEST, ABDOMEN, AND PELVIS WITH CONTRAST  TECHNIQUE: Multidetector CT imaging of the chest, abdomen and pelvis was performed following the standard protocol during bolus administration of intravenous contrast.  CONTRAST:  1077mOMNIPAQUE IOHEXOL 300 MG/ML  SOLN  COMPARISON:  Chest CT  06/29/2014; PET-CT 06/18/2014  FINDINGS: CT CHEST FINDINGS  Mediastinum/Nodes: Heterogeneous nodule within the right thyroid lobe. Slight interval decrease in size of right peritracheal lymph node measuring 12 mm, previously 14 mm (image 27; series 2). Normal heart size. No pericardial effusion. Aorta and main pulmonary artery normal in caliber. Coronary arterial vascular calcifications. Status post CABG procedure.  Lungs/Pleura: The central airways are patent. Interval decrease in size of right upper lobe mass measuring 3.3 x 2.4 cm, previously 4.0 x 4.7 cm (image 14; series 5). Interval decrease in size of left lower lobe spiculated nodule measuring 1.7 x 1.1 cm, previously 2.0 x 1.4 cm (image 46; series 5). No new or enlarging pulmonary nodules or masses are identified. There are multiple new metallic density  foci within the left lower lobe (image 45; series 5). No pleural effusion or pneumothorax.  CT ABDOMEN AND PELVIS FINDINGS  Hepatobiliary: Liver is normal in size and contour. No focal hepatic lesion is identified. The patient status post cholecystectomy. No intrahepatic or extrahepatic biliary ductal dilatation.  Pancreas: Unremarkable  Spleen: Nonspecific 9 mm low-attenuation lesion in the spleen (image 72; series 2).  Adrenals/Urinary Tract: The adrenal glands are normal. The kidneys enhance symmetrically with contrast. There is stable bilateral perinephric fat stranding. No hydronephrosis. Urinary bladder is unremarkable. Punctate bilateral nonobstructing renal stones.  Stomach/Bowel: Sigmoid colonic diverticulosis. No CT evidence to suggest acute diverticulitis. No abnormal bowel wall thickening or evidence for bowel obstruction. No free fluid or free intraperitoneal air. Small hiatal hernia.  Vascular/Lymphatic: Patient status post aortic graft which appears patent. Postsurgical changes within the left inguinal region. No retroperitoneal lymphadenopathy.  Other: Multiple seeds within the prostate.   Musculoskeletal: Lumbar spine degenerative changes. No aggressive or acute appearing osseous lesions.  IMPRESSION: Interval decrease in size right upper lobe mass and left lower lobe nodule.  Slight interval decrease in size of right peritracheal lymph node.  Nonspecific low-attenuation splenic lesion, likely benign in etiology. Recommend attention on followup.   Electronically Signed   By: Lovey Newcomer M.D.   On: 11/05/2014 15:03    ASSESSMENT AND PLAN: This is a very pleasant 74 years old white male with a stage IV non-small cell lung cancer currently undergoing systemic chemotherapy with carboplatin and Alimta status post 3 cycles. The patient tolerated the first cycle well.He will proceed with cycle #4 today as scheduled. The patient was discussed with and also seen by Dr. Julien Nordmann. His recent CT scan revealed interval decrease in the size of his disease. He will continue on his current systemic chemotherapy. He will proceed with cycle #4 today as scheduled. He will continue with weekly labs as previously scheduled and follow-up in 3 weeks prior to the start of cycle #5. The patient was advised to call immediately if he has any concerning symptoms in the interval. The patient voices understanding of current disease status and treatment options and is in agreement with the current care plan.  All questions were answered. The patient knows to call the clinic with any problems, questions or concerns. We can certainly see the patient much sooner if necessary.  Carlton Adam, PA-C 11/12/2014  ADDENDUM: Hematology/Oncology Attending: I had a face to face encounter with the patient. I recommended his care plan. This is a very pleasant 74 years old white male with a stage IV non-small cell lung cancer, adenocarcinoma was currently undergoing systemic chemotherapy with carboplatin and Alimta status post 3 cycles. The patient is tolerating his treatment fairly well. The recent CT scan of the chest, abdomen  and pelvis showed evidence for improvement of his disease. I discussed the scan results with the patient and his wife. I recommended for him to proceed with cycle #4 today as scheduled. The patient would come back for follow-up visit in 3 weeks for reevaluation before starting cycle #5. He was advised to call immediately if he has any concerning symptoms in the interval.  Disclaimer: This note was dictated with voice recognition software. Similar sounding words can inadvertently be transcribed and may be missed upon review. Eilleen Kempf., MD 11/21/2014

## 2014-11-17 NOTE — Patient Instructions (Signed)
Your CT scan showed improvement in your disease Continue weekly labs as scheduled Follow up in 3 weeks prior to your next scheduled cycle of chemotherapy

## 2014-11-18 ENCOUNTER — Telehealth: Payer: Self-pay | Admitting: *Deleted

## 2014-11-18 DIAGNOSIS — C3491 Malignant neoplasm of unspecified part of right bronchus or lung: Secondary | ICD-10-CM

## 2014-11-18 NOTE — Telephone Encounter (Signed)
TC from pt's wife regarding appt tomorrow for labs. Pt is to get lab work weekly but wife states he does not have appts for this week or next week. Will send pof for labs tomorrow and for weekly.

## 2014-11-19 ENCOUNTER — Telehealth: Payer: Self-pay | Admitting: Internal Medicine

## 2014-11-19 ENCOUNTER — Other Ambulatory Visit (HOSPITAL_BASED_OUTPATIENT_CLINIC_OR_DEPARTMENT_OTHER): Payer: Medicare Other

## 2014-11-19 ENCOUNTER — Other Ambulatory Visit: Payer: Self-pay | Admitting: Physician Assistant

## 2014-11-19 DIAGNOSIS — C3411 Malignant neoplasm of upper lobe, right bronchus or lung: Secondary | ICD-10-CM | POA: Diagnosis not present

## 2014-11-19 DIAGNOSIS — C3491 Malignant neoplasm of unspecified part of right bronchus or lung: Secondary | ICD-10-CM

## 2014-11-19 LAB — COMPREHENSIVE METABOLIC PANEL (CC13)
ALBUMIN: 3.7 g/dL (ref 3.5–5.0)
ALK PHOS: 71 U/L (ref 40–150)
ALT: 16 U/L (ref 0–55)
AST: 36 U/L — AB (ref 5–34)
Anion Gap: 8 mEq/L (ref 3–11)
BILIRUBIN TOTAL: 0.34 mg/dL (ref 0.20–1.20)
BUN: 17.6 mg/dL (ref 7.0–26.0)
CO2: 26 meq/L (ref 22–29)
CREATININE: 1.1 mg/dL (ref 0.7–1.3)
Calcium: 9.5 mg/dL (ref 8.4–10.4)
Chloride: 106 mEq/L (ref 98–109)
EGFR: 65 mL/min/{1.73_m2} — ABNORMAL LOW (ref >90)
GLUCOSE: 136 mg/dL (ref 70–140)
Potassium: 4.9 mEq/L (ref 3.5–5.1)
SODIUM: 140 meq/L (ref 136–145)
TOTAL PROTEIN: 6.8 g/dL (ref 6.4–8.3)

## 2014-11-19 LAB — CBC WITH DIFFERENTIAL/PLATELET
BASO%: 0.4 % (ref 0.0–2.0)
Basophils Absolute: 0 10*3/uL (ref 0.0–0.1)
EOS ABS: 0 10*3/uL (ref 0.0–0.5)
EOS%: 1.1 % (ref 0.0–7.0)
HCT: 36.4 % — ABNORMAL LOW (ref 38.4–49.9)
HEMOGLOBIN: 11.9 g/dL — AB (ref 13.0–17.1)
LYMPH%: 23.3 % (ref 14.0–49.0)
MCH: 30 pg (ref 27.2–33.4)
MCHC: 32.7 g/dL (ref 32.0–36.0)
MCV: 91.7 fL (ref 79.3–98.0)
MONO#: 0.1 10*3/uL (ref 0.1–0.9)
MONO%: 1.8 % (ref 0.0–14.0)
NEUT%: 73.4 % (ref 39.0–75.0)
NEUTROS ABS: 2.1 10*3/uL (ref 1.5–6.5)
Platelets: 135 10*3/uL — ABNORMAL LOW (ref 140–400)
RBC: 3.97 10*6/uL — AB (ref 4.20–5.82)
RDW: 21.5 % — AB (ref 11.0–14.6)
WBC: 2.8 10*3/uL — AB (ref 4.0–10.3)
lymph#: 0.7 10*3/uL — ABNORMAL LOW (ref 0.9–3.3)

## 2014-11-19 NOTE — Telephone Encounter (Signed)
s.w. pt and advised on todays appt.....pt ok and aware °

## 2014-11-26 ENCOUNTER — Other Ambulatory Visit (HOSPITAL_BASED_OUTPATIENT_CLINIC_OR_DEPARTMENT_OTHER): Payer: Medicare Other

## 2014-11-26 DIAGNOSIS — C3491 Malignant neoplasm of unspecified part of right bronchus or lung: Secondary | ICD-10-CM

## 2014-11-26 DIAGNOSIS — C3411 Malignant neoplasm of upper lobe, right bronchus or lung: Secondary | ICD-10-CM | POA: Diagnosis not present

## 2014-11-26 LAB — CBC WITH DIFFERENTIAL/PLATELET
BASO%: 0.1 % (ref 0.0–2.0)
BASOS ABS: 0 10*3/uL (ref 0.0–0.1)
EOS%: 0.5 % (ref 0.0–7.0)
Eosinophils Absolute: 0 10*3/uL (ref 0.0–0.5)
HEMATOCRIT: 37.5 % — AB (ref 38.4–49.9)
HEMOGLOBIN: 12.3 g/dL — AB (ref 13.0–17.1)
LYMPH#: 0.3 10*3/uL — AB (ref 0.9–3.3)
LYMPH%: 13.7 % — ABNORMAL LOW (ref 14.0–49.0)
MCH: 30.2 pg (ref 27.2–33.4)
MCHC: 32.8 g/dL (ref 32.0–36.0)
MCV: 92.3 fL (ref 79.3–98.0)
MONO#: 0.1 10*3/uL (ref 0.1–0.9)
MONO%: 7 % (ref 0.0–14.0)
NEUT#: 1.6 10*3/uL (ref 1.5–6.5)
NEUT%: 78.7 % — AB (ref 39.0–75.0)
NRBC: 0 % (ref 0–0)
Platelets: 44 10*3/uL — ABNORMAL LOW (ref 140–400)
RBC: 4.06 10*6/uL — ABNORMAL LOW (ref 4.20–5.82)
RDW: 23.1 % — ABNORMAL HIGH (ref 11.0–14.6)
WBC: 2.1 10*3/uL — ABNORMAL LOW (ref 4.0–10.3)

## 2014-11-26 LAB — COMPREHENSIVE METABOLIC PANEL (CC13)
ALBUMIN: 3.9 g/dL (ref 3.5–5.0)
ALK PHOS: 80 U/L (ref 40–150)
ALT: 21 U/L (ref 0–55)
AST: 48 U/L — AB (ref 5–34)
Anion Gap: 7 mEq/L (ref 3–11)
BILIRUBIN TOTAL: 0.41 mg/dL (ref 0.20–1.20)
BUN: 11.7 mg/dL (ref 7.0–26.0)
CALCIUM: 9.5 mg/dL (ref 8.4–10.4)
CO2: 28 mEq/L (ref 22–29)
CREATININE: 1 mg/dL (ref 0.7–1.3)
Chloride: 107 mEq/L (ref 98–109)
EGFR: 70 mL/min/{1.73_m2} — ABNORMAL LOW (ref >90)
Glucose: 130 mg/dl (ref 70–140)
POTASSIUM: 5.1 meq/L (ref 3.5–5.1)
Sodium: 141 mEq/L (ref 136–145)
TOTAL PROTEIN: 6.9 g/dL (ref 6.4–8.3)

## 2014-12-02 ENCOUNTER — Encounter: Payer: Self-pay | Admitting: Physician Assistant

## 2014-12-02 ENCOUNTER — Ambulatory Visit (HOSPITAL_BASED_OUTPATIENT_CLINIC_OR_DEPARTMENT_OTHER): Payer: Medicare Other | Admitting: Physician Assistant

## 2014-12-02 ENCOUNTER — Ambulatory Visit (HOSPITAL_BASED_OUTPATIENT_CLINIC_OR_DEPARTMENT_OTHER): Payer: Medicare Other

## 2014-12-02 ENCOUNTER — Other Ambulatory Visit (HOSPITAL_BASED_OUTPATIENT_CLINIC_OR_DEPARTMENT_OTHER): Payer: Medicare Other

## 2014-12-02 DIAGNOSIS — C7931 Secondary malignant neoplasm of brain: Secondary | ICD-10-CM

## 2014-12-02 DIAGNOSIS — C3491 Malignant neoplasm of unspecified part of right bronchus or lung: Secondary | ICD-10-CM

## 2014-12-02 DIAGNOSIS — C3411 Malignant neoplasm of upper lobe, right bronchus or lung: Secondary | ICD-10-CM | POA: Diagnosis not present

## 2014-12-02 DIAGNOSIS — Z23 Encounter for immunization: Secondary | ICD-10-CM

## 2014-12-02 DIAGNOSIS — Z5111 Encounter for antineoplastic chemotherapy: Secondary | ICD-10-CM

## 2014-12-02 LAB — CBC WITH DIFFERENTIAL/PLATELET
BASO%: 0.6 % (ref 0.0–2.0)
BASOS ABS: 0 10*3/uL (ref 0.0–0.1)
EOS%: 2.2 % (ref 0.0–7.0)
Eosinophils Absolute: 0.1 10*3/uL (ref 0.0–0.5)
HEMATOCRIT: 39.5 % (ref 38.4–49.9)
HEMOGLOBIN: 13.1 g/dL (ref 13.0–17.1)
LYMPH#: 0.7 10*3/uL — AB (ref 0.9–3.3)
LYMPH%: 26.7 % (ref 14.0–49.0)
MCH: 30.9 pg (ref 27.2–33.4)
MCHC: 33.1 g/dL (ref 32.0–36.0)
MCV: 93.4 fL (ref 79.3–98.0)
MONO#: 0.5 10*3/uL (ref 0.1–0.9)
MONO%: 19.6 % — AB (ref 0.0–14.0)
NEUT%: 50.9 % (ref 39.0–75.0)
NEUTROS ABS: 1.4 10*3/uL — AB (ref 1.5–6.5)
Platelets: 147 10*3/uL (ref 140–400)
RBC: 4.23 10*6/uL (ref 4.20–5.82)
RDW: 23.3 % — AB (ref 11.0–14.6)
WBC: 2.8 10*3/uL — AB (ref 4.0–10.3)

## 2014-12-02 LAB — COMPREHENSIVE METABOLIC PANEL (CC13)
ALBUMIN: 4 g/dL (ref 3.5–5.0)
ALK PHOS: 74 U/L (ref 40–150)
ALT: 12 U/L (ref 0–55)
AST: 36 U/L — AB (ref 5–34)
Anion Gap: 8 mEq/L (ref 3–11)
BUN: 8.7 mg/dL (ref 7.0–26.0)
CO2: 26 mEq/L (ref 22–29)
CREATININE: 1 mg/dL (ref 0.7–1.3)
Calcium: 9 mg/dL (ref 8.4–10.4)
Chloride: 106 mEq/L (ref 98–109)
EGFR: 72 mL/min/{1.73_m2} — ABNORMAL LOW (ref >90)
GLUCOSE: 85 mg/dL (ref 70–140)
POTASSIUM: 4 meq/L (ref 3.5–5.1)
SODIUM: 140 meq/L (ref 136–145)
Total Bilirubin: 0.35 mg/dL (ref 0.20–1.20)
Total Protein: 7.1 g/dL (ref 6.4–8.3)

## 2014-12-02 MED ORDER — SODIUM CHLORIDE 0.9 % IV SOLN
Freq: Once | INTRAVENOUS | Status: AC
Start: 2014-12-02 — End: 2014-12-02
  Administered 2014-12-02: 12:00:00 via INTRAVENOUS

## 2014-12-02 MED ORDER — DEXAMETHASONE SODIUM PHOSPHATE 100 MG/10ML IJ SOLN
Freq: Once | INTRAMUSCULAR | Status: AC
  Administered 2014-12-02: 12:00:00 via INTRAVENOUS
  Filled 2014-12-02: qty 8

## 2014-12-02 MED ORDER — INFLUENZA VAC SPLIT QUAD 0.5 ML IM SUSY
0.5000 mL | PREFILLED_SYRINGE | Freq: Once | INTRAMUSCULAR | Status: AC
  Administered 2014-12-02: 0.5 mL via INTRAMUSCULAR
  Filled 2014-12-02: qty 0.5

## 2014-12-02 MED ORDER — INFLUENZA VAC SPLIT QUAD 0.5 ML IM SUSY
0.5000 mL | PREFILLED_SYRINGE | INTRAMUSCULAR | Status: DC
  Filled 2014-12-02: qty 0.5

## 2014-12-02 MED ORDER — SODIUM CHLORIDE 0.9 % IV SOLN
500.0000 mg/m2 | Freq: Once | INTRAVENOUS | Status: AC
  Administered 2014-12-02: 1000 mg via INTRAVENOUS
  Filled 2014-12-02: qty 40

## 2014-12-02 MED ORDER — SODIUM CHLORIDE 0.9 % IV SOLN
515.0000 mg | Freq: Once | INTRAVENOUS | Status: AC
  Administered 2014-12-02: 520 mg via INTRAVENOUS
  Filled 2014-12-02: qty 52

## 2014-12-02 NOTE — Patient Instructions (Signed)
[facility] Discharge Instructions for Patients Receiving Chemotherapy  Today you received the following chemotherapy agents Carboplatin/Alimta  To help prevent nausea and vomiting after your treatment, we encourage you to take your nausea medication     If you develop nausea and vomiting that is not controlled by your nausea medication, call the clinic.   BELOW ARE SYMPTOMS THAT SHOULD BE REPORTED IMMEDIATELY:  *FEVER GREATER THAN 100.5 F  *CHILLS WITH OR WITHOUT FEVER  NAUSEA AND VOMITING THAT IS NOT CONTROLLED WITH YOUR NAUSEA MEDICATION  *UNUSUAL SHORTNESS OF BREATH  *UNUSUAL BRUISING OR BLEEDING  TENDERNESS IN MOUTH AND THROAT WITH OR WITHOUT PRESENCE OF ULCERS  *URINARY PROBLEMS  *BOWEL PROBLEMS  UNUSUAL RASH Items with * indicate a potential emergency and should be followed up as soon as possible.  Feel free to call the clinic you have any questions or concerns. The clinic phone number is [phone].  Please show the [facility] at check-in to the Emergency Department and triage nurse.

## 2014-12-02 NOTE — Progress Notes (Addendum)
[place] [phone]   [phone]  OFFICE PROGRESS NOTE  Leonard Downing, MD [address] [place] [address]   DIAGNOSIS: Adenocarcinoma of right lung  Staging form: Lung, AJCC 7th Edition  Clinical stage from 08/13/2014: Stage IV (T2a, N2, M1a) - Signed by Curt Bears, MD on 08/15/2014  PRIOR THERAPY: None  CURRENT THERAPY: Systemic chemotherapy carboplatin for AUC of 5 and Alimta at 500 mg/m given every 3 weeks. Status post 4 cycles  INTERVAL HISTORY: Steve Baker 74 y.o. male returns to the clinic today for follow-up visit accompanied by his wife. The patient is tolerating his current systemic chemotherapy with carboplatin and Alimta fairly well with no significant adverse effects. He denied having any significant fatigue or weakness. He denied having any nausea or vomiting. He has no fever or chills. The patient denied having any significant chest pain but continues to have shortness of breath with exertion with no cough or hemoptysis. He is here today to start cycle #5 of his systemic chemotherapy. He would like a flu shot today. He is interested in completing advanced directives.  MEDICAL HISTORY: Past Medical History  Diagnosis Date  . Hyperlipidemia   . Irregular heart beat     Pt stated his heart skips a beat every now and then  . Shortness of breath dyspnea     with ambulation  . Peripheral vascular disease   . Anxiety   . Coronary artery disease   . Hypertension     lisinopril  . Dysrhythmia   . COPD (chronic obstructive pulmonary disease)   . History of bronchitis   . Anemia   . 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 Brain cancer     brain metastasis  . Lung cancer     adenocarcinoma of right lung    ALLERGIES:  has No Known Allergies.  MEDICATIONS:  Current Outpatient Prescriptions  Medication Sig Dispense Refill  . aspirin EC 81 MG tablet Take 81 mg by mouth daily.    Marland Kitchen dexamethasone (DECADRON)  4 MG tablet 4 mg po bid, the day before, day of and day after chemo 40 tablet 1  . FeFum-FePoly-FA-B Cmp-C-Biot (INTEGRA PLUS) CAPS Take 1 capsule by mouth every morning. 30 capsule 2  . folic acid (FOLVITE) 1 MG tablet Take 1 tablet (1 mg total) by mouth daily. 30 tablet 4  . furosemide (LASIX) 40 MG tablet Take 40 mg by mouth daily as needed for edema.   0  . lisinopril (PRINIVIL,ZESTRIL) 20 MG tablet Take 20 mg by mouth daily.    Marland Kitchen LORazepam (ATIVAN) 1 MG tablet Take 1 tablet (1 mg total) by mouth every 8 (eight) hours. (Patient not taking: Reported on 09/21/2014) 30 tablet 0  . pravastatin (PRAVACHOL) 40 MG tablet Take 40 mg by mouth daily.    . prochlorperazine (COMPAZINE) 10 MG tablet Take 1 tablet (10 mg total) by mouth every 6 (six) hours as needed for nausea or vomiting. (Patient not taking: Reported on 09/21/2014) 30 tablet 0  . Tiotropium Bromide Monohydrate (SPIRIVA RESPIMAT) 2.5 MCG/ACT AERS Inhale 2 puffs into the lungs daily. 1 Inhaler 4   No current facility-administered medications for this visit.   Facility-Administered Medications Ordered in Other Visits  Medication Dose Route Frequency Provider Last Rate Last Dose  . [START ON 12/03/2014] Influenza vac split quadrivalent PF (FLUARIX) injection 0.5 mL  0.5 mL Intramuscular Tomorrow-1000 Curt Bears, MD   0.5 mL at  12/02/14 1102    SURGICAL HISTORY:  Past Surgical History  Procedure Laterality Date  . Cholecystectomy    . Heart bypass    . Cardiac catheterization  2001  . Skin cancer excision N/A     Nose  . Abdominal aortic aneurysm repair    . Eye surgery Bilateral     cataract removal  . Colonoscopy w/ polypectomy    . Cardiac catheterization N/A 07/16/2014    Procedure: Right/Left Heart Cath and Coronary Angiography;  Surgeon: Burnell Blanks, MD;  Location: [facility];  Service: Cardiovascular;  Laterality: N/A;  . Coronary artery bypass graft    . Video bronchoscopy with endobronchial navigation  N/A 07/29/2014    Procedure: VIDEO BRONCHOSCOPY WITH ENDOBRONCHIAL NAVIGATION;  Surgeon: Collene Gobble, MD;  Location: [facility];  Service: Thoracic;  Laterality: N/A;  . Endarterectomy femoral Left 08/19/2014    Procedure: PROFUNDA FEMORIS ENDARTERECTOMY ;  Surgeon: Rosetta Posner, MD;  Location: [facility];  Service: Vascular;  Laterality: Left;  . Femoral-popliteal bypass graft Left 08/19/2014    Procedure: FEMORAL-POPLITEAL ARTERY BYPASS GRAFT WITH PROPATEN GORTEX ;  Surgeon: Rosetta Posner, MD;  Location: [facility];  Service: Vascular;  Laterality: Left;  . Patch angioplasty Left 08/19/2014    Procedure: LEFT PROFUNDA FEMORIS PATCH ANGIOPLASTY USING HEMASHIELD PLATINUM FINESSE PATCH;  Surgeon: Rosetta Posner, MD;  Location: [facility];  Service: Vascular;  Laterality: Left;  . Peripheral vascular catheterization N/A 08/05/2014    Procedure: Abdominal Aortogram w/Lower Extremity;  Surgeon: Wellington Hampshire, MD;  Location: [facility];  Service: Cardiovascular;  Laterality: N/A;    REVIEW OF SYSTEMS:  A comprehensive review of systems was negative.   PHYSICAL EXAMINATION: General appearance: alert, cooperative and no distress Head: Normocephalic, without obvious abnormality, atraumatic Neck: no adenopathy, no JVD, supple, symmetrical, trachea midline and thyroid not enlarged, symmetric, no tenderness/mass/nodules Lymph nodes: Cervical, supraclavicular, and axillary nodes normal. Resp: clear to auscultation bilaterally Back: symmetric, no curvature. ROM normal. No CVA tenderness. Cardio: regular rate and rhythm, S1, S2 normal, no murmur, click, rub or gallop GI: soft, non-tender; bowel sounds normal; no masses,  no organomegaly Extremities: extremities normal, atraumatic, no cyanosis or edema  ECOG PERFORMANCE STATUS: 1 - Symptomatic but completely ambulatory  There were no vitals taken for this visit.  LABORATORY DATA: Lab Results  Component Value Date   WBC 2.8* 12/02/2014   HGB 13.1 12/02/2014   HCT  39.5 12/02/2014   MCV 93.4 12/02/2014   PLT 147 12/02/2014      Chemistry      Component Value Date/Time   NA 140 12/02/2014 0929   NA 133* 08/21/2014 0312   K 4.0 12/02/2014 0929   K 4.1 08/21/2014 0312   CL 101 08/21/2014 0312   CO2 26 12/02/2014 0929   CO2 25 08/21/2014 0312   BUN 8.7 12/02/2014 0929   BUN 8 08/21/2014 0312   CREATININE 1.0 12/02/2014 0929   CREATININE 0.96 08/21/2014 0312      Component Value Date/Time   CALCIUM 9.0 12/02/2014 0929   CALCIUM 8.5* 08/21/2014 0312   ALKPHOS 74 12/02/2014 0929   ALKPHOS 82 08/18/2014 0945   AST 36* 12/02/2014 0929   AST 29 08/18/2014 0945   ALT 12 12/02/2014 0929   ALT 9* 08/18/2014 0945   BILITOT 0.35 12/02/2014 0929   BILITOT 0.4 08/18/2014 0945       RADIOGRAPHIC STUDIES: Ct Chest W Contrast  11/05/2014  CLINICAL DATA:  Patient with history of lung cancer on chemotherapy. Remote history of prostate cancer.  EXAM: CT CHEST, ABDOMEN, AND PELVIS WITH CONTRAST  TECHNIQUE: Multidetector CT imaging of the chest, abdomen and pelvis was performed following the standard protocol during bolus administration of intravenous contrast.  CONTRAST:  162m OMNIPAQUE IOHEXOL 300 MG/ML  SOLN  COMPARISON:  Chest CT 06/29/2014; PET-CT 06/18/2014  FINDINGS: CT CHEST FINDINGS  Mediastinum/Nodes: Heterogeneous nodule within the right thyroid lobe. Slight interval decrease in size of right peritracheal lymph node measuring 12 mm, previously 14 mm (image 27; series 2). Normal heart size. No pericardial effusion. Aorta and main pulmonary artery normal in caliber. Coronary arterial vascular calcifications. Status post CABG procedure.  Lungs/Pleura: The central airways are patent. Interval decrease in size of right upper lobe mass measuring 3.3 x 2.4 cm, previously 4.0 x 4.7 cm (image 14; series 5). Interval decrease in size of left lower lobe spiculated nodule measuring 1.7 x 1.1 cm, previously 2.0 x 1.4 cm (image 46; series 5). No new or enlarging  pulmonary nodules or masses are identified. There are multiple new metallic density foci within the left lower lobe (image 45; series 5). No pleural effusion or pneumothorax.  CT ABDOMEN AND PELVIS FINDINGS  Hepatobiliary: Liver is normal in size and contour. No focal hepatic lesion is identified. The patient status post cholecystectomy. No intrahepatic or extrahepatic biliary ductal dilatation.  Pancreas: Unremarkable  Spleen: Nonspecific 9 mm low-attenuation lesion in the spleen (image 72; series 2).  Adrenals/Urinary Tract: The adrenal glands are normal. The kidneys enhance symmetrically with contrast. There is stable bilateral perinephric fat stranding. No hydronephrosis. Urinary bladder is unremarkable. Punctate bilateral nonobstructing renal stones.  Stomach/Bowel: Sigmoid colonic diverticulosis. No CT evidence to suggest acute diverticulitis. No abnormal bowel wall thickening or evidence for bowel obstruction. No free fluid or free intraperitoneal air. Small hiatal hernia.  Vascular/Lymphatic: Patient status post aortic graft which appears patent. Postsurgical changes within the left inguinal region. No retroperitoneal lymphadenopathy.  Other: Multiple seeds within the prostate.  Musculoskeletal: Lumbar spine degenerative changes. No aggressive or acute appearing osseous lesions.  IMPRESSION: Interval decrease in size right upper lobe mass and left lower lobe nodule.  Slight interval decrease in size of right peritracheal lymph node.  Nonspecific low-attenuation splenic lesion, likely benign in etiology. Recommend attention on followup.   Electronically Signed   By: DLovey NewcomerM.D.   On: 11/05/2014 15:03   Ct Abdomen Pelvis W Contrast  11/05/2014   CLINICAL DATA:  Patient with history of lung cancer on chemotherapy. Remote history of prostate cancer.  EXAM: CT CHEST, ABDOMEN, AND PELVIS WITH CONTRAST  TECHNIQUE: Multidetector CT imaging of the chest, abdomen and pelvis was performed following the standard  protocol during bolus administration of intravenous contrast.  CONTRAST:  1082mOMNIPAQUE IOHEXOL 300 MG/ML  SOLN  COMPARISON:  Chest CT 06/29/2014; PET-CT 06/18/2014  FINDINGS: CT CHEST FINDINGS  Mediastinum/Nodes: Heterogeneous nodule within the right thyroid lobe. Slight interval decrease in size of right peritracheal lymph node measuring 12 mm, previously 14 mm (image 27; series 2). Normal heart size. No pericardial effusion. Aorta and main pulmonary artery normal in caliber. Coronary arterial vascular calcifications. Status post CABG procedure.  Lungs/Pleura: The central airways are patent. Interval decrease in size of right upper lobe mass measuring 3.3 x 2.4 cm, previously 4.0 x 4.7 cm (image 14; series 5). Interval decrease in size of left lower lobe spiculated nodule measuring 1.7 x 1.1 cm, previously 2.0 x 1.4  cm (image 46; series 5). No new or enlarging pulmonary nodules or masses are identified. There are multiple new metallic density foci within the left lower lobe (image 45; series 5). No pleural effusion or pneumothorax.  CT ABDOMEN AND PELVIS FINDINGS  Hepatobiliary: Liver is normal in size and contour. No focal hepatic lesion is identified. The patient status post cholecystectomy. No intrahepatic or extrahepatic biliary ductal dilatation.  Pancreas: Unremarkable  Spleen: Nonspecific 9 mm low-attenuation lesion in the spleen (image 72; series 2).  Adrenals/Urinary Tract: The adrenal glands are normal. The kidneys enhance symmetrically with contrast. There is stable bilateral perinephric fat stranding. No hydronephrosis. Urinary bladder is unremarkable. Punctate bilateral nonobstructing renal stones.  Stomach/Bowel: Sigmoid colonic diverticulosis. No CT evidence to suggest acute diverticulitis. No abnormal bowel wall thickening or evidence for bowel obstruction. No free fluid or free intraperitoneal air. Small hiatal hernia.  Vascular/Lymphatic: Patient status post aortic graft which appears patent.  Postsurgical changes within the left inguinal region. No retroperitoneal lymphadenopathy.  Other: Multiple seeds within the prostate.  Musculoskeletal: Lumbar spine degenerative changes. No aggressive or acute appearing osseous lesions.  IMPRESSION: Interval decrease in size right upper lobe mass and left lower lobe nodule.  Slight interval decrease in size of right peritracheal lymph node.  Nonspecific low-attenuation splenic lesion, likely benign in etiology. Recommend attention on followup.   Electronically Signed   By: Lovey Newcomer M.D.   On: 11/05/2014 15:03    ASSESSMENT AND PLAN: This is a very pleasant 74 years old white male with a stage IV non-small cell lung cancer currently undergoing systemic chemotherapy with carboplatin and Alimta status post 3 cycles. The patient tolerated the first cycle well.He will proceed with cycle #4 today as scheduled. The patient was discussed with and also seen by Dr. Julien Nordmann. His recent CT scan revealed interval decrease in the size of his disease. He will continue on his current systemic chemotherapy. He will proceed with cycle #5 today as scheduled. He will continue with weekly labs as previously scheduled and follow-up in 3 weeks prior to the start of cycle #6. He will be given a flu vaccine today. He was referred to our social work department regarding advanced directives. The patient was advised to call immediately if he has any concerning symptoms in the interval. The patient voices understanding of current disease status and treatment options and is in agreement with the current care plan.  All questions were answered. The patient knows to call the clinic with any problems, questions or concerns. We can certainly see the patient much sooner if necessary.  Carlton Adam, PA-C 12/02/2014  ADDENDUM: Hematology/Oncology Attending: I had a face to face encounter with the patient. I recommended his care plan. This is a very pleasant 74 years old white male  with a stage IV non-small cell lung cancer currently undergoing systemic chemotherapy with carboplatin for AUC of 5 and Alimta 500 MG/M2 status post 4 cycles. The patient has been tolerating his treatment fairly well with no significant adverse effects. I recommended for him to proceed with cycle #5 today as a scheduled. The patient will receive flu vaccine today. He would come back for follow-up visit in 3 weeks for reevaluation before starting cycle #6. The patient was advised to call immediately if he has any concerning symptoms in the interval.  Disclaimer: This note was dictated with voice recognition software. Similar sounding words can inadvertently be transcribed and may not be corrected upon review.  Eilleen Kempf., MD 12/04/2014

## 2014-12-02 NOTE — Patient Instructions (Signed)
Continue weekly labs as scheduled. Follow up in 3 weeks, prior to your next scheduled cycle of chemotherapy 

## 2014-12-02 NOTE — Progress Notes (Signed)
Neut = 1.4, okay to treat today, per Awilda Metro, PA.

## 2014-12-03 ENCOUNTER — Other Ambulatory Visit: Payer: Self-pay | Admitting: Radiation Therapy

## 2014-12-03 DIAGNOSIS — C7931 Secondary malignant neoplasm of brain: Secondary | ICD-10-CM

## 2014-12-09 ENCOUNTER — Other Ambulatory Visit (HOSPITAL_BASED_OUTPATIENT_CLINIC_OR_DEPARTMENT_OTHER): Payer: Medicare Other

## 2014-12-09 DIAGNOSIS — C3411 Malignant neoplasm of upper lobe, right bronchus or lung: Secondary | ICD-10-CM | POA: Diagnosis not present

## 2014-12-09 DIAGNOSIS — C3491 Malignant neoplasm of unspecified part of right bronchus or lung: Secondary | ICD-10-CM

## 2014-12-09 LAB — CBC WITH DIFFERENTIAL/PLATELET
BASO%: 0.8 % (ref 0.0–2.0)
BASOS ABS: 0 10*3/uL (ref 0.0–0.1)
EOS%: 2.5 % (ref 0.0–7.0)
Eosinophils Absolute: 0 10*3/uL (ref 0.0–0.5)
HEMATOCRIT: 35.4 % — AB (ref 38.4–49.9)
HGB: 11.8 g/dL — ABNORMAL LOW (ref 13.0–17.1)
LYMPH#: 0.5 10*3/uL — AB (ref 0.9–3.3)
LYMPH%: 44.5 % (ref 14.0–49.0)
MCH: 31.6 pg (ref 27.2–33.4)
MCHC: 33.3 g/dL (ref 32.0–36.0)
MCV: 94.7 fL (ref 79.3–98.0)
MONO#: 0.1 10*3/uL (ref 0.1–0.9)
MONO%: 5 % (ref 0.0–14.0)
NEUT#: 0.6 10*3/uL — ABNORMAL LOW (ref 1.5–6.5)
NEUT%: 47.2 % (ref 39.0–75.0)
PLATELETS: 125 10*3/uL — AB (ref 140–400)
RBC: 3.74 10*6/uL — ABNORMAL LOW (ref 4.20–5.82)
RDW: 18.9 % — ABNORMAL HIGH (ref 11.0–14.6)
WBC: 1.2 10*3/uL — ABNORMAL LOW (ref 4.0–10.3)

## 2014-12-09 LAB — COMPREHENSIVE METABOLIC PANEL (CC13)
ALT: 13 U/L (ref 0–55)
ANION GAP: 9 meq/L (ref 3–11)
AST: 43 U/L — ABNORMAL HIGH (ref 5–34)
Albumin: 3.8 g/dL (ref 3.5–5.0)
Alkaline Phosphatase: 73 U/L (ref 40–150)
BUN: 16.2 mg/dL (ref 7.0–26.0)
CALCIUM: 9.4 mg/dL (ref 8.4–10.4)
CHLORIDE: 104 meq/L (ref 98–109)
CO2: 29 mEq/L (ref 22–29)
Creatinine: 1 mg/dL (ref 0.7–1.3)
EGFR: 77 mL/min/{1.73_m2} — AB (ref >90)
Glucose: 92 mg/dl (ref 70–140)
POTASSIUM: 3.7 meq/L (ref 3.5–5.1)
Sodium: 141 mEq/L (ref 136–145)
Total Bilirubin: 0.82 mg/dL (ref 0.20–1.20)
Total Protein: 6.9 g/dL (ref 6.4–8.3)

## 2014-12-16 ENCOUNTER — Telehealth: Payer: Self-pay | Admitting: Internal Medicine

## 2014-12-16 ENCOUNTER — Other Ambulatory Visit (HOSPITAL_BASED_OUTPATIENT_CLINIC_OR_DEPARTMENT_OTHER): Payer: Medicare Other

## 2014-12-16 ENCOUNTER — Telehealth: Payer: Self-pay | Admitting: *Deleted

## 2014-12-16 DIAGNOSIS — C3411 Malignant neoplasm of upper lobe, right bronchus or lung: Secondary | ICD-10-CM | POA: Diagnosis not present

## 2014-12-16 DIAGNOSIS — C3491 Malignant neoplasm of unspecified part of right bronchus or lung: Secondary | ICD-10-CM

## 2014-12-16 LAB — CBC WITH DIFFERENTIAL/PLATELET
BASO%: 0.7 % (ref 0.0–2.0)
BASOS ABS: 0 10*3/uL (ref 0.0–0.1)
EOS ABS: 0 10*3/uL (ref 0.0–0.5)
EOS%: 1.4 % (ref 0.0–7.0)
HCT: 30.5 % — ABNORMAL LOW (ref 38.4–49.9)
HEMOGLOBIN: 10.3 g/dL — AB (ref 13.0–17.1)
LYMPH#: 0.7 10*3/uL — AB (ref 0.9–3.3)
LYMPH%: 47.8 % (ref 14.0–49.0)
MCH: 31.7 pg (ref 27.2–33.4)
MCHC: 33.8 g/dL (ref 32.0–36.0)
MCV: 93.8 fL (ref 79.3–98.0)
MONO#: 0.4 10*3/uL (ref 0.1–0.9)
MONO%: 26.8 % — AB (ref 0.0–14.0)
NEUT%: 23.3 % — ABNORMAL LOW (ref 39.0–75.0)
NEUTROS ABS: 0.3 10*3/uL — AB (ref 1.5–6.5)
NRBC: 2 % — AB (ref 0–0)
PLATELETS: 27 10*3/uL — AB (ref 140–400)
RBC: 3.25 10*6/uL — AB (ref 4.20–5.82)
RDW: 17.6 % — AB (ref 11.0–14.6)
WBC: 1.4 10*3/uL — AB (ref 4.0–10.3)

## 2014-12-16 LAB — COMPREHENSIVE METABOLIC PANEL (CC13)
ALBUMIN: 3.8 g/dL (ref 3.5–5.0)
ALK PHOS: 84 U/L (ref 40–150)
ALT: 19 U/L (ref 0–55)
AST: 60 U/L — ABNORMAL HIGH (ref 5–34)
Anion Gap: 10 mEq/L (ref 3–11)
BILIRUBIN TOTAL: 0.52 mg/dL (ref 0.20–1.20)
BUN: 11.9 mg/dL (ref 7.0–26.0)
CO2: 25 meq/L (ref 22–29)
CREATININE: 1 mg/dL (ref 0.7–1.3)
Calcium: 9.3 mg/dL (ref 8.4–10.4)
Chloride: 105 mEq/L (ref 98–109)
EGFR: 73 mL/min/{1.73_m2} — AB (ref >90)
GLUCOSE: 92 mg/dL (ref 70–140)
Potassium: 4.1 mEq/L (ref 3.5–5.1)
SODIUM: 140 meq/L (ref 136–145)
TOTAL PROTEIN: 6.8 g/dL (ref 6.4–8.3)

## 2014-12-16 NOTE — Telephone Encounter (Signed)
Lab results reviewed by MD. VO for pt to have Granix x2 days. Called pt unable to reach. LMOVM he will need Granix injection x 2days for low ANC. Request pt to call back to confirm message received. POF to scheduling

## 2014-12-16 NOTE — Telephone Encounter (Signed)
s.w. pt wife and avised on OCT appt.Marland KitchenMarland KitchenMarland KitchenMarland Kitchenpt ok and aware

## 2014-12-17 ENCOUNTER — Ambulatory Visit
Admission: RE | Admit: 2014-12-17 | Discharge: 2014-12-17 | Disposition: A | Discharge status: Home / Self Care | Payer: Medicare Other | Source: Ambulatory Visit | Attending: Radiation Oncology | Admitting: Radiation Oncology

## 2014-12-17 ENCOUNTER — Ambulatory Visit (HOSPITAL_BASED_OUTPATIENT_CLINIC_OR_DEPARTMENT_OTHER): Payer: Medicare Other

## 2014-12-17 ENCOUNTER — Other Ambulatory Visit: Payer: Self-pay | Admitting: *Deleted

## 2014-12-17 DIAGNOSIS — D701 Agranulocytosis secondary to cancer chemotherapy: Secondary | ICD-10-CM | POA: Diagnosis not present

## 2014-12-17 DIAGNOSIS — C7931 Secondary malignant neoplasm of brain: Secondary | ICD-10-CM

## 2014-12-17 DIAGNOSIS — C3491 Malignant neoplasm of unspecified part of right bronchus or lung: Secondary | ICD-10-CM | POA: Diagnosis not present

## 2014-12-17 MED ORDER — TBO-FILGRASTIM 480 MCG/0.8ML ~~LOC~~ SOSY
480.0000 ug | PREFILLED_SYRINGE | Freq: Once | SUBCUTANEOUS | Status: AC
  Administered 2014-12-17: 480 ug via SUBCUTANEOUS
  Filled 2014-12-17: qty 0.8

## 2014-12-17 MED ORDER — TBO-FILGRASTIM 480 MCG/0.8ML ~~LOC~~ SOSY: 480.0000 ug | PREFILLED_SYRINGE | Freq: Once | SUBCUTANEOUS | Status: DC

## 2014-12-17 MED ORDER — GADOBENATE DIMEGLUMINE 529 MG/ML IV SOLN: 20.0000 mL | Freq: Once | INTRAVENOUS | Status: DC | PRN

## 2014-12-18 ENCOUNTER — Ambulatory Visit (HOSPITAL_BASED_OUTPATIENT_CLINIC_OR_DEPARTMENT_OTHER): Payer: Medicare Other

## 2014-12-18 ENCOUNTER — Other Ambulatory Visit: Payer: Self-pay | Admitting: Radiation Therapy

## 2014-12-18 ENCOUNTER — Telehealth: Payer: Self-pay | Admitting: Radiation Oncology

## 2014-12-18 VITALS — BP 116/55 | HR 75 | Temp 97.8°F

## 2014-12-18 DIAGNOSIS — C3411 Malignant neoplasm of upper lobe, right bronchus or lung: Secondary | ICD-10-CM | POA: Diagnosis not present

## 2014-12-18 DIAGNOSIS — C3491 Malignant neoplasm of unspecified part of right bronchus or lung: Secondary | ICD-10-CM

## 2014-12-18 MED ORDER — CYANOCOBALAMIN 1000 MCG/ML IJ SOLN
1000.0000 ug | Freq: Once | INTRAMUSCULAR | Status: AC
  Administered 2014-12-18: 1000 ug via INTRAMUSCULAR

## 2014-12-18 NOTE — Patient Instructions (Signed)

## 2014-12-18 NOTE — Telephone Encounter (Signed)
-----   Message from Tyler Pita, MD sent at 12/17/2014  7:17 PM EDT ----- Steve Baker can call this patient.  The abnormal finding on his brain MRI continues to look OK.  The radiologists do not believe this was a spot of cancer.  We can cancel his 10/17 follow-up and get a new MRI and follow-up in 6 months. MM

## 2014-12-18 NOTE — Telephone Encounter (Signed)
Phoned patient's home with MRI results per Dr. Johny Shears order. Spoke with patient's wife, Izora Gala. Explained that the MRI continues to look OK and the radiologist does not believe the abnormal spot was cancer. She verbalized understanding. Izora Gala cancelled follow up for 10/17. Will request Uday Jantz arrange a follow up and MRI in six months then, phone the patient.

## 2014-12-21 ENCOUNTER — Ambulatory Visit: Payer: Self-pay | Admitting: Radiation Oncology

## 2014-12-22 ENCOUNTER — Encounter: Payer: Self-pay | Admitting: Emergency Medicine

## 2014-12-22 ENCOUNTER — Ambulatory Visit (INDEPENDENT_AMBULATORY_CARE_PROVIDER_SITE_OTHER): Payer: Medicare Other | Admitting: Emergency Medicine

## 2014-12-22 VITALS — BP 122/72 | HR 80 | Ht 68.0 in | Wt 194.0 lb

## 2014-12-22 DIAGNOSIS — J449 Chronic obstructive pulmonary disease, unspecified: Secondary | ICD-10-CM | POA: Diagnosis not present

## 2014-12-22 DIAGNOSIS — C3491 Malignant neoplasm of unspecified part of right bronchus or lung: Secondary | ICD-10-CM | POA: Diagnosis not present

## 2014-12-22 NOTE — Assessment & Plan Note (Signed)
Most important thing for him to do at this point is to stop smoking. We discussed cessation in detail today with some tips for cutting down until he is ready to approach setting quit date. We'll continue his Spiriva. We will start albuterol to be used as needed. Flu shot is up-to-date

## 2014-12-22 NOTE — Assessment & Plan Note (Signed)
Continue treatment and follow-up with Dr. Julien Nordmann

## 2014-12-22 NOTE — Patient Instructions (Signed)
Please continue your Spiriva daily We will get you an albuterol inhaler to use up to every 4 hours if needed for shortness of breath.  We discussed smoking cessation today, with some ways to help you cut down.  Follow with dr Julien Nordmann as planned.  Follow with Dr Lamonte Sakai in 4 months or sooner if you have any problems.

## 2014-12-22 NOTE — Progress Notes (Signed)
Subjective:    Patient ID: Steve Baker, male    DOB: Jan 01, 1941, 74 y.o.   MRN: [id number]  HPI 74 year old active smoker (71 pk-yrs) with history of CAD/CABG, abdominal aneurysm repair, hypertension, prostate CA (2001), hyperlipidemia. He underwent a screening CT scan of the chest 06/04/14 ordered by Dr. Arelia Sneddon that showed a dominant right upper lobe mass and a smaller left lower lobe nodule. He was evaluated in the lung cancer screening program and a PET scan has been obtained 06/18/14 > both lesions are hypermetabolic, as was a slightly enlarged right peritracheal node. There was evidence of pharyngeal esophageal activity without a corresponding anatomical abnormality on CT scan.   He denies any hemoptysis although he has had frequent cough. He has dyspnea that appears to be stable. He is not on any bronchodilators at this time. He has about 6 pounds of unintentional weight loss over the last month.   ROV 08/11/14 -- follow-up visit for tobacco use, presumed COPD, and newly diagnosed adenocarcinoma of the lung. This was obtained from biopsy of his right upper lobe mass. He also had a smaller left lower lobe nodule. Cytology on this was not definitive for malignancy. I placed fiducial markers in the right upper lobe mass and also triangulating the left lower lobe nodule. He has a mediastinal node that is hypermetabolic on PET scan. I considered biopsying this but I do not believe it'll change his management because he is a poor surgical candidate based on his clinical status. He is scheduled for [facility] this Thursday.  Hx taken from the pt and his wife. He is still having cough, has some exertional SOB.   ROV 12/22/14 -- follow-up visit for COPD, tobacco use, and adenocarcinoma the lung for which she is undergoing chemotherapy with Dr. Julien Nordmann. Last Ct scan chest 11/05/14 showed that RUL mass and LLL nodule were shrinking. His breathing continues to limit him. He is on spiriva although unsure whether this helps  him. He has daily cough, prod of clear to white. Smoking 1 pk a day.   Review of Systems As per HPI   Past Medical History  Diagnosis Date  . Hyperlipidemia   . Irregular heart beat     Pt stated his heart skips a beat every now and then  . Shortness of breath dyspnea     with ambulation  . Peripheral vascular disease   . Anxiety   . Coronary artery disease   . Hypertension     lisinopril  . Dysrhythmia   . COPD (chronic obstructive pulmonary disease)   . History of bronchitis   . Anemia   . 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 Brain cancer     brain metastasis  . Lung cancer     adenocarcinoma of right lung     Family History  Problem Relation Age of Onset  . Heart Problems Mother   . Heart attack Father   . Stroke Neg Hx      Social History   Social History  . Marital Status: Married    Spouse Name: nancy  . Number of Children: 2  . Years of Education: college   Occupational History  . retired    Social History Main Topics  . Smoking status: Current Every Day Smoker -- 1.00 packs/day for 56 years    Types: Cigarettes  . Smokeless tobacco: Never Used  . Alcohol Use: 1.8 oz/week    3 Cans of beer, 0 Standard drinks or equivalent  per week     Comment: daily  . Drug Use: No  . Sexual Activity: Not Currently   Other Topics Concern  . 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 aspirin EC 81 MG tablet Take 81 mg by mouth daily.    Marland Kitchen dexamethasone (DECADRON) 4 MG tablet 4 mg po bid, the day before, day of and day after chemo 40 tablet 1  . FeFum-FePoly-FA-B Cmp-C-Biot (INTEGRA PLUS) CAPS Take 1 capsule by mouth every morning. 30 capsule 2  . folic acid (FOLVITE) 1 MG tablet Take 1 tablet (1 mg total) by mouth daily. 30 tablet 4  . furosemide (LASIX) 40 MG tablet  Take 40 mg by mouth daily as needed for edema.   0  . lisinopril (PRINIVIL,ZESTRIL) 20 MG tablet Take 20 mg by mouth daily.    . pravastatin (PRAVACHOL) 40 MG tablet Take 40 mg by mouth daily.    . Tiotropium Bromide Monohydrate (SPIRIVA RESPIMAT) 2.5 MCG/ACT AERS Inhale 2 puffs into the lungs daily. 1 Inhaler 4  . LORazepam (ATIVAN) 1 MG tablet Take 1 tablet (1 mg total) by mouth every 8 (eight) hours. (Patient not taking: Reported on 09/21/2014) 30 tablet 0  . prochlorperazine (COMPAZINE) 10 MG tablet Take 1 tablet (10 mg total) by mouth every 6 (six) hours as needed for nausea or vomiting. (Patient not taking: Reported on 09/21/2014) 30 tablet 0   No facility-administered medications prior to visit.       Objective:   Physical Exam Filed Vitals:   12/22/14 1627  BP: 122/72  Pulse: 80  Height: '5\' 8"'$  (1.727 m)  Weight: 194 lb (87.998 kg)  SpO2: 91%   Gen: Pleasant, well-nourished, in no distress,  normal affect  ENT: No lesions,  Tongue is blackened with cigarette tar,  no postnasal drip  Neck: No JVD, no TMG, no carotid bruits  Lungs: No use of accessory muscles,  clear without rales or rhonchi  Cardiovascular: RRR, heart sounds normal, no murmur or gallops, no peripheral edema  Musculoskeletal: No deformities, no cyanosis or clubbing. He does have nicotine staining on his nails  Neuro: alert, non focal  Skin: Warm, no lesions or rashes     Assessment & Plan:  COPD (chronic obstructive pulmonary disease) Most important thing for him to do at this point is to stop smoking. We discussed cessation in detail today with some tips for cutting down until he is ready to approach setting quit date. We'll continue his Spiriva. We will start albuterol to be used as needed. Flu shot is up-to-date  Adenocarcinoma of right lung Continue treatment and follow-up with Dr. Julien Nordmann

## 2014-12-23 ENCOUNTER — Encounter: Payer: Self-pay | Admitting: *Deleted

## 2014-12-23 ENCOUNTER — Ambulatory Visit (HOSPITAL_BASED_OUTPATIENT_CLINIC_OR_DEPARTMENT_OTHER): Payer: Medicare Other

## 2014-12-23 ENCOUNTER — Ambulatory Visit (HOSPITAL_BASED_OUTPATIENT_CLINIC_OR_DEPARTMENT_OTHER): Payer: Medicare Other | Admitting: Internal Medicine

## 2014-12-23 ENCOUNTER — Other Ambulatory Visit (HOSPITAL_BASED_OUTPATIENT_CLINIC_OR_DEPARTMENT_OTHER): Payer: Medicare Other

## 2014-12-23 ENCOUNTER — Encounter: Payer: Self-pay | Admitting: Internal Medicine

## 2014-12-23 ENCOUNTER — Telehealth: Payer: Self-pay | Admitting: Internal Medicine

## 2014-12-23 VITALS — BP 133/64 | HR 86 | Temp 98.3°F | Resp 18 | Ht 68.0 in | Wt 193.0 lb

## 2014-12-23 DIAGNOSIS — C3411 Malignant neoplasm of upper lobe, right bronchus or lung: Secondary | ICD-10-CM

## 2014-12-23 DIAGNOSIS — Z720 Tobacco use: Secondary | ICD-10-CM | POA: Diagnosis not present

## 2014-12-23 DIAGNOSIS — Z5111 Encounter for antineoplastic chemotherapy: Secondary | ICD-10-CM | POA: Diagnosis not present

## 2014-12-23 DIAGNOSIS — C3491 Malignant neoplasm of unspecified part of right bronchus or lung: Secondary | ICD-10-CM

## 2014-12-23 DIAGNOSIS — J438 Other emphysema: Secondary | ICD-10-CM

## 2014-12-23 DIAGNOSIS — Z789 Other specified health status: Secondary | ICD-10-CM

## 2014-12-23 HISTORY — DX: Other specified health status: Z78.9

## 2014-12-23 LAB — CBC WITH DIFFERENTIAL/PLATELET
BASO%: 0.6 % (ref 0.0–2.0)
BASOS ABS: 0 10*3/uL (ref 0.0–0.1)
EOS ABS: 0.1 10*3/uL (ref 0.0–0.5)
EOS%: 1.4 % (ref 0.0–7.0)
HEMATOCRIT: 29.3 % — AB (ref 38.4–49.9)
HEMOGLOBIN: 9.6 g/dL — AB (ref 13.0–17.1)
LYMPH#: 0.6 10*3/uL — AB (ref 0.9–3.3)
LYMPH%: 17.4 % (ref 14.0–49.0)
MCH: 31.8 pg (ref 27.2–33.4)
MCHC: 32.8 g/dL (ref 32.0–36.0)
MCV: 97 fL (ref 79.3–98.0)
MONO#: 0.8 10*3/uL (ref 0.1–0.9)
MONO%: 21.6 % — ABNORMAL HIGH (ref 0.0–14.0)
NEUT#: 2.1 10*3/uL (ref 1.5–6.5)
NEUT%: 59 % (ref 39.0–75.0)
NRBC: 1 % — AB (ref 0–0)
PLATELETS: 175 10*3/uL (ref 140–400)
RBC: 3.02 10*6/uL — ABNORMAL LOW (ref 4.20–5.82)
RDW: 20.3 % — AB (ref 11.0–14.6)
WBC: 3.6 10*3/uL — ABNORMAL LOW (ref 4.0–10.3)

## 2014-12-23 LAB — COMPREHENSIVE METABOLIC PANEL (CC13)
ALBUMIN: 3.6 g/dL (ref 3.5–5.0)
ALK PHOS: 85 U/L (ref 40–150)
ALT: 10 U/L (ref 0–55)
ANION GAP: 9 meq/L (ref 3–11)
AST: 43 U/L — ABNORMAL HIGH (ref 5–34)
BUN: 7.8 mg/dL (ref 7.0–26.0)
CALCIUM: 8.8 mg/dL (ref 8.4–10.4)
CO2: 24 mEq/L (ref 22–29)
Chloride: 108 mEq/L (ref 98–109)
Creatinine: 1.1 mg/dL (ref 0.7–1.3)
EGFR: 68 mL/min/{1.73_m2} — AB (ref >90)
GLUCOSE: 130 mg/dL (ref 70–140)
POTASSIUM: 3.7 meq/L (ref 3.5–5.1)
SODIUM: 141 meq/L (ref 136–145)
TOTAL PROTEIN: 6.4 g/dL (ref 6.4–8.3)

## 2014-12-23 MED ORDER — SODIUM CHLORIDE 0.9 % IV SOLN
Freq: Once | INTRAVENOUS | Status: AC
  Administered 2014-12-23: 13:00:00 via INTRAVENOUS
  Filled 2014-12-23: qty 8

## 2014-12-23 MED ORDER — CYANOCOBALAMIN 1000 MCG/ML IJ SOLN
INTRAMUSCULAR | Status: AC
  Filled 2014-12-23: qty 1

## 2014-12-23 MED ORDER — SODIUM CHLORIDE 0.9 % IV SOLN
Freq: Once | INTRAVENOUS | Status: AC
  Administered 2014-12-23: 12:00:00 via INTRAVENOUS

## 2014-12-23 MED ORDER — SODIUM CHLORIDE 0.9 % IV SOLN
500.0000 mg/m2 | Freq: Once | INTRAVENOUS | Status: AC
  Administered 2014-12-23: 1000 mg via INTRAVENOUS
  Filled 2014-12-23: qty 40

## 2014-12-23 MED ORDER — CARBOPLATIN CHEMO INJECTION 600 MG/60ML
515.0000 mg | Freq: Once | INTRAVENOUS | Status: AC
  Administered 2014-12-23: 520 mg via INTRAVENOUS
  Filled 2014-12-23: qty 52

## 2014-12-23 MED ORDER — CYANOCOBALAMIN 1000 MCG/ML IJ SOLN: 1000.0000 ug | Freq: Once | INTRAMUSCULAR | Status: DC

## 2014-12-23 NOTE — Patient Instructions (Signed)
Smoking Cessation, Tips for Success If you are ready to quit smoking, congratulations! You have chosen to help yourself be healthier. Cigarettes bring nicotine, tar, carbon monoxide, and other irritants into your body. Your lungs, heart, and blood vessels will be able to work better without these poisons. There are many different ways to quit smoking. Nicotine gum, nicotine patches, a nicotine inhaler, or nicotine nasal spray can help with physical craving. Hypnosis, support groups, and medicines help break the habit of smoking. WHAT THINGS CAN I DO TO MAKE QUITTING EASIER?  Here are some tips to help you quit for good:  Pick a date when you will quit smoking completely. Tell all of your friends and family about your plan to quit on that date.  Do not try to slowly cut down on the number of cigarettes you are smoking. Pick a quit date and quit smoking completely starting on that day.  Throw away all cigarettes.   Clean and remove all ashtrays from your home, work, and car.  On a card, write down your reasons for quitting. Carry the card with you and read it when you get the urge to smoke.  Cleanse your body of nicotine. Drink enough water and fluids to keep your urine clear or pale yellow. Do this after quitting to flush the nicotine from your body.  Learn to predict your moods. Do not let a bad situation be your excuse to have a cigarette. Some situations in your life might tempt you into wanting a cigarette.  Never have "just one" cigarette. It leads to wanting another and another. Remind yourself of your decision to quit.  Change habits associated with smoking. If you smoked while driving or when feeling stressed, try other activities to replace smoking. Stand up when drinking your coffee. Brush your teeth after eating. Sit in a different chair when you read the paper. Avoid alcohol while trying to quit, and try to drink fewer caffeinated beverages. Alcohol and caffeine may urge you to  smoke.  Avoid foods and drinks that can trigger a desire to smoke, such as sugary or spicy foods and alcohol.  Ask people who smoke not to smoke around you.  Have something planned to do right after eating or having a cup of coffee. For example, plan to take a walk or exercise.  Try a relaxation exercise to calm you down and decrease your stress. Remember, you may be tense and nervous for the first 2 weeks after you quit, but this will pass.  Find new activities to keep your hands busy. Play with a pen, coin, or rubber band. Doodle or draw things on paper.  Brush your teeth right after eating. This will help cut down on the craving for the taste of tobacco after meals. You can also try mouthwash.   Use oral substitutes in place of cigarettes. Try using lemon drops, carrots, cinnamon sticks, or chewing gum. Keep them handy so they are available when you have the urge to smoke.  When you have the urge to smoke, try deep breathing.  Designate your home as a nonsmoking area.  If you are a heavy smoker, ask your health care provider about a prescription for nicotine chewing gum. It can ease your withdrawal from nicotine.  Reward yourself. Set aside the cigarette money you save and buy yourself something nice.  Look for support from others. Join a support group or smoking cessation program. Ask someone at home or at work to help you with your plan   to quit smoking.  Always ask yourself, "Do I need this cigarette or is this just a reflex?" Tell yourself, "Today, I choose not to smoke," or "I do not want to smoke." You are reminding yourself of your decision to quit.  Do not replace cigarette smoking with electronic cigarettes (commonly called e-cigarettes). The safety of e-cigarettes is unknown, and some may contain harmful chemicals.  If you relapse, do not give up! Plan ahead and think about what you will do the next time you get the urge to smoke. HOW WILL I FEEL WHEN I QUIT SMOKING? You  may have symptoms of withdrawal because your body is used to nicotine (the addictive substance in cigarettes). You may crave cigarettes, be irritable, feel very hungry, cough often, get headaches, or have difficulty concentrating. The withdrawal symptoms are only temporary. They are strongest when you first quit but will go away within 10-14 days. When withdrawal symptoms occur, stay in control. Think about your reasons for quitting. Remind yourself that these are signs that your body is healing and getting used to being without cigarettes. Remember that withdrawal symptoms are easier to treat than the major diseases that smoking can cause.  Even after the withdrawal is over, expect periodic urges to smoke. However, these cravings are generally short lived and will go away whether you smoke or not. Do not smoke! WHAT RESOURCES ARE AVAILABLE TO HELP ME QUIT SMOKING? Your health care provider can direct you to community resources or hospitals for support, which may include:  Group support.  Education.  Hypnosis.  Therapy.   This information is not intended to replace advice given to you by your health care provider. Make sure you discuss any questions you have with your health care provider.   Document Released: 11/19/2003 Document Revised: 03/13/2014 Document Reviewed: 08/08/2012 Elsevier Interactive Patient Education 2016 [employer]  

## 2014-12-23 NOTE — Patient Instructions (Signed)
[facility] Discharge Instructions for Patients Receiving Chemotherapy  Today you received the following chemotherapy agents: Alimta and Carboplatin.  To help prevent nausea and vomiting after your treatment, we encourage you to take your nausea medication as directed.   If you develop nausea and vomiting that is not controlled by your nausea medication, call the clinic.   BELOW ARE SYMPTOMS THAT SHOULD BE REPORTED IMMEDIATELY:  *FEVER GREATER THAN 100.5 F  *CHILLS WITH OR WITHOUT FEVER  NAUSEA AND VOMITING THAT IS NOT CONTROLLED WITH YOUR NAUSEA MEDICATION  *UNUSUAL SHORTNESS OF BREATH  *UNUSUAL BRUISING OR BLEEDING  TENDERNESS IN MOUTH AND THROAT WITH OR WITHOUT PRESENCE OF ULCERS  *URINARY PROBLEMS  *BOWEL PROBLEMS  UNUSUAL RASH Items with * indicate a potential emergency and should be followed up as soon as possible.  Feel free to call the clinic you have any questions or concerns. The clinic phone number is [phone].  Please show the CHEMO ALERT CARD at check-in to the Emergency Department and triage nurse.   

## 2014-12-23 NOTE — Telephone Encounter (Signed)
Gave adn printed appt sched and avs for OCT and NOV...gv barium

## 2014-12-23 NOTE — Progress Notes (Signed)
[place] [phone]   [phone]  OFFICE PROGRESS NOTE  Leonard Downing, MD [address] [place] [address]   DIAGNOSIS: Adenocarcinoma of right lung  Staging form: Lung, AJCC 7th Edition  Clinical stage from 08/13/2014: Stage IV (T2a, N2, M1a) - Signed by Curt Bears, MD on 08/15/2014  PRIOR THERAPY: None  CURRENT THERAPY: Systemic chemotherapy carboplatin for AUC of 5 and Alimta at 500 mg/m given every 3 weeks. Status post 5 cycles.  INTERVAL HISTORY: Steve Baker 74 y.o. male returns to the clinic today for follow-up visit accompanied by his wife. The patient is currently undergoing systemic chemotherapy with carboplatin and Alimta status post 5 cycles and tolerating his treatment fairly well with no significant adverse effects. He denied having any significant nausea or vomiting. He has no fever or chills. He denied having any significant chest pain but continues to have mild cough with shortness breath with exertion and no hemoptysis. The patient denied having any significant weight loss or night sweats. He had repeat MRI of the brain recently that showed resolution of the questionable lesion in the brain. He is here today to start cycle #6 of his systemic chemotherapy. Unfortunately continues to smoke one pack of cigarette every day but he is trying to quit slowly.  MEDICAL HISTORY: Past Medical History  Diagnosis Date  . Hyperlipidemia   . Irregular heart beat     Pt stated his heart skips a beat every now and then  . Shortness of breath dyspnea     with ambulation  . Peripheral vascular disease ([place])   . Anxiety   . Coronary artery disease   . Hypertension     lisinopril  . Dysrhythmia   . COPD (chronic obstructive pulmonary disease) ([place])   . History of bronchitis   . Anemia   . Prostate cancer ([place]) 2001    seeds  . 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 Lung cancer ([place])     adenocarcinoma of  right lung    ALLERGIES:  has No Known Allergies.  MEDICATIONS:  Current Outpatient Prescriptions  Medication Sig Dispense Refill  . aspirin EC 81 MG tablet Take 81 mg by mouth daily.    Marland Kitchen dexamethasone (DECADRON) 4 MG tablet 4 mg po bid, the day before, day of and day after chemo 40 tablet 1  . FeFum-FePoly-FA-B Cmp-C-Biot (INTEGRA PLUS) CAPS Take 1 capsule by mouth every morning. 30 capsule 2  . folic acid (FOLVITE) 1 MG tablet Take 1 tablet (1 mg total) by mouth daily. 30 tablet 4  . lisinopril (PRINIVIL,ZESTRIL) 20 MG tablet Take 20 mg by mouth daily.    . pravastatin (PRAVACHOL) 40 MG tablet Take 40 mg by mouth daily.    . Tiotropium Bromide Monohydrate (SPIRIVA RESPIMAT) 2.5 MCG/ACT AERS Inhale 2 puffs into the lungs daily. 1 Inhaler 4   No current facility-administered medications for this visit.    SURGICAL HISTORY:  Past Surgical History  Procedure Laterality Date  . Cholecystectomy    . Heart bypass    . Cardiac catheterization  2001  . Skin cancer excision N/A     Nose  . Abdominal aortic aneurysm repair    . Eye surgery Bilateral     cataract removal  . Colonoscopy w/ polypectomy    . Cardiac catheterization N/A 07/16/2014    Procedure: Right/Left Heart Cath and Coronary Angiography;  Surgeon: Burnell Blanks, MD;  Location:  [facility];  Service: Cardiovascular;  Laterality: N/A;  . Coronary artery bypass graft    . Video bronchoscopy with endobronchial navigation N/A 07/29/2014    Procedure: VIDEO BRONCHOSCOPY WITH ENDOBRONCHIAL NAVIGATION;  Surgeon: Collene Gobble, MD;  Location: [facility] [place];  Service: Thoracic;  Laterality: N/A;  . Endarterectomy femoral Left 08/19/2014    Procedure: PROFUNDA FEMORIS ENDARTERECTOMY ;  Surgeon: Rosetta Posner, MD;  Location: [facility];  Service: Vascular;  Laterality: Left;  . Femoral-popliteal bypass graft Left 08/19/2014    Procedure: FEMORAL-POPLITEAL ARTERY BYPASS GRAFT WITH PROPATEN GORTEX ;  Surgeon: Rosetta Posner, MD;   Location: [facility];  Service: Vascular;  Laterality: Left;  . Patch angioplasty Left 08/19/2014    Procedure: LEFT PROFUNDA FEMORIS PATCH ANGIOPLASTY USING HEMASHIELD PLATINUM FINESSE PATCH;  Surgeon: Rosetta Posner, MD;  Location: [facility];  Service: Vascular;  Laterality: Left;  . Peripheral vascular catheterization N/A 08/05/2014    Procedure: Abdominal Aortogram w/Lower Extremity;  Surgeon: Wellington Hampshire, MD;  Location: [facility];  Service: Cardiovascular;  Laterality: N/A;    REVIEW OF SYSTEMS:  Constitutional: negative Eyes: negative Ears, nose, mouth, throat, and face: negative Respiratory: positive for cough and dyspnea on exertion Cardiovascular: negative Gastrointestinal: negative Genitourinary:negative Integument/breast: negative Hematologic/lymphatic: negative Musculoskeletal:negative Neurological: negative Behavioral/Psych: positive for tobacco use Endocrine: negative Allergic/Immunologic: negative   PHYSICAL EXAMINATION: General appearance: alert, cooperative and no distress Head: Normocephalic, without obvious abnormality, atraumatic Neck: no adenopathy, no JVD, supple, symmetrical, trachea midline and thyroid not enlarged, symmetric, no tenderness/mass/nodules Lymph nodes: Cervical, supraclavicular, and axillary nodes normal. Resp: clear to auscultation bilaterally Back: symmetric, no curvature. ROM normal. No CVA tenderness. Cardio: regular rate and rhythm, S1, S2 normal, no murmur, click, rub or gallop GI: soft, non-tender; bowel sounds normal; no masses,  no organomegaly Extremities: extremities normal, atraumatic, no cyanosis or edema Neurologic: Alert and oriented X 3, normal strength and tone. Normal symmetric reflexes. Normal coordination and gait  ECOG PERFORMANCE STATUS: 1 - Symptomatic but completely ambulatory  Blood pressure 133/64, pulse 86, temperature 98.3 F (36.8 C), temperature source Oral, resp. rate 18, height '5\' 8"'$  (1.727 m), weight 193 lb  (87.544 kg), SpO2 96 %.  LABORATORY DATA: Lab Results  Component Value Date   WBC 3.6* 12/23/2014   HGB 9.6* 12/23/2014   HCT 29.3* 12/23/2014   MCV 97.0 12/23/2014   PLT 175 12/23/2014      Chemistry      Component Value Date/Time   NA 140 12/16/2014 0857   NA 133* 08/21/2014 0312   K 4.1 12/16/2014 0857   K 4.1 08/21/2014 0312   CL 101 08/21/2014 0312   CO2 25 12/16/2014 0857   CO2 25 08/21/2014 0312   BUN 11.9 12/16/2014 0857   BUN 8 08/21/2014 0312   CREATININE 1.0 12/16/2014 0857   CREATININE 0.96 08/21/2014 0312      Component Value Date/Time   CALCIUM 9.3 12/16/2014 0857   CALCIUM 8.5* 08/21/2014 0312   ALKPHOS 84 12/16/2014 0857   ALKPHOS 82 08/18/2014 0945   AST 60* 12/16/2014 0857   AST 29 08/18/2014 0945   ALT 19 12/16/2014 0857   ALT 9* 08/18/2014 0945   BILITOT 0.52 12/16/2014 0857   BILITOT 0.4 08/18/2014 0945       RADIOGRAPHIC STUDIES: Mr Jeri Cos Wo Contrast  2015/01/08  CLINICAL DATA:  Lung cancer. Brain metastases. The patient has completed chemotherapy. EXAM: MRI HEAD WITHOUT AND WITH CONTRAST TECHNIQUE: Multiplanar, multiecho  pulse sequences of the brain and surrounding structures were obtained without and with intravenous contrast. CONTRAST:  20 mi MultiHance. COMPARISON:  MRI brain 09/18/2014 FINDINGS: The previously noted punctate focus of enhancement within the left occipital lobe is no longer present. Given the interval appearance of this lesion, this may have been a focus of previous ischemia versus a tiny subacute infarct. Periventricular and subcortical T2 changes are more prominent on today's study. Moderate generalized atrophy is similar to the prior exam. The ventricles are proportionate to the degree of atrophy. No significant extra-axial fluid collection is present. Flow is present in the major intracranial arteries. Bilateral lens replacements are present. The paranasal sinuses are clear. There is no significant fluid in the mastoid air  cells. Skullbase is within normal limits. Midline structures are unremarkable. No pathologic enhancement is present to suggest metastatic disease of the brain or meninges. IMPRESSION: 1. Interval resolution of punctate focus of enhancement in the left occipital lobe. This was more likely ischemic than the treated metastasis. 2. No other significant enhancement to suggest metastatic disease elsewhere. 3. Progressive periventricular and subcortical white matter changes. This may reflect the sequelae of treatment. Electronically Signed   By: San Morelle M.D.   On: 12/17/2014 14:58    ASSESSMENT AND PLAN: This is a very pleasant 74 years old white male with a stage IV non-small cell lung cancer currently undergoing systemic chemotherapy with carboplatin and Alimta status post 5 cycles.  The patient is tolerating his current treatment fairly well with no significant adverse effect except for mild fatigue. I recommended for him to proceed with cycle #6 today as scheduled. He would come back for follow-up visit in 3 weeks for evaluation after repeating CT scan of the chest, abdomen and pelvis for restaging of his disease. His recent MRI of the brain showed resolution of the punctate focus in the left occipital lobe. For smoke cessation, I strongly encouraged the patient to quit smoking and offered him a smoke cessation program. CODE STATUS was discussed with the patient and he is full code for now. The patient was advised to call immediately if he has any concerning symptoms in the interval. The patient voices understanding of current disease status and treatment options and is in agreement with the current care plan.  All questions were answered. The patient knows to call the clinic with any problems, questions or concerns. We can certainly see the patient much sooner if necessary.  Disclaimer: This note was dictated with voice recognition software. Similar sounding words can inadvertently be  transcribed and may not be corrected upon review.

## 2014-12-23 NOTE — Progress Notes (Signed)
[facility] Social Work  Clinical Social Work was referred by patient to review and complete healthcare advance directives.  Clinical Social Worker met with patient and patient's spouse in [place] office.  The patient designated spouse as their primary healthcare agent and son as their secondary agent.  Patient also completed healthcare living will.    Clinical Social Worker notarized documents and made copies for patient/family. Clinical Social Worker will send documents to medical records to be scanned into patient's chart. Clinical Social Worker encouraged patient/family to contact with any additional questions or concerns.  Polo Riley, MSW, [place] Worker [facility] [phone]

## 2014-12-30 ENCOUNTER — Other Ambulatory Visit (HOSPITAL_BASED_OUTPATIENT_CLINIC_OR_DEPARTMENT_OTHER): Payer: Medicare Other

## 2014-12-30 DIAGNOSIS — C3411 Malignant neoplasm of upper lobe, right bronchus or lung: Secondary | ICD-10-CM

## 2014-12-30 DIAGNOSIS — C3491 Malignant neoplasm of unspecified part of right bronchus or lung: Secondary | ICD-10-CM

## 2014-12-30 LAB — CBC WITH DIFFERENTIAL/PLATELET
BASO%: 1.2 % (ref 0.0–2.0)
Basophils Absolute: 0 10*3/uL (ref 0.0–0.1)
EOS ABS: 0 10*3/uL (ref 0.0–0.5)
EOS%: 1.2 % (ref 0.0–7.0)
HCT: 27.8 % — ABNORMAL LOW (ref 38.4–49.9)
HGB: 9.3 g/dL — ABNORMAL LOW (ref 13.0–17.1)
LYMPH#: 0.6 10*3/uL — AB (ref 0.9–3.3)
LYMPH%: 33.1 % (ref 14.0–49.0)
MCH: 33.1 pg (ref 27.2–33.4)
MCHC: 33.5 g/dL (ref 32.0–36.0)
MCV: 98.9 fL — ABNORMAL HIGH (ref 79.3–98.0)
MONO#: 0.1 10*3/uL (ref 0.1–0.9)
MONO%: 4.2 % (ref 0.0–14.0)
NEUT%: 60.3 % (ref 39.0–75.0)
NEUTROS ABS: 1 10*3/uL — AB (ref 1.5–6.5)
PLATELETS: 126 10*3/uL — AB (ref 140–400)
RBC: 2.81 10*6/uL — AB (ref 4.20–5.82)
RDW: 18.6 % — ABNORMAL HIGH (ref 11.0–14.6)
WBC: 1.7 10*3/uL — AB (ref 4.0–10.3)

## 2014-12-30 LAB — COMPREHENSIVE METABOLIC PANEL (CC13)
ALT: 13 U/L (ref 0–55)
ANION GAP: 10 meq/L (ref 3–11)
AST: 53 U/L — AB (ref 5–34)
Albumin: 3.8 g/dL (ref 3.5–5.0)
Alkaline Phosphatase: 80 U/L (ref 40–150)
BILIRUBIN TOTAL: 0.89 mg/dL (ref 0.20–1.20)
BUN: 23 mg/dL (ref 7.0–26.0)
CHLORIDE: 106 meq/L (ref 98–109)
CO2: 23 meq/L (ref 22–29)
CREATININE: 1.2 mg/dL (ref 0.7–1.3)
Calcium: 9.4 mg/dL (ref 8.4–10.4)
EGFR: 58 mL/min/{1.73_m2} — ABNORMAL LOW (ref >90)
GLUCOSE: 92 mg/dL (ref 70–140)
Potassium: 4.1 mEq/L (ref 3.5–5.1)
SODIUM: 140 meq/L (ref 136–145)
TOTAL PROTEIN: 6.9 g/dL (ref 6.4–8.3)

## 2015-01-06 ENCOUNTER — Other Ambulatory Visit (HOSPITAL_BASED_OUTPATIENT_CLINIC_OR_DEPARTMENT_OTHER): Payer: Medicare Other

## 2015-01-06 DIAGNOSIS — C3411 Malignant neoplasm of upper lobe, right bronchus or lung: Secondary | ICD-10-CM

## 2015-01-06 DIAGNOSIS — C3491 Malignant neoplasm of unspecified part of right bronchus or lung: Secondary | ICD-10-CM

## 2015-01-06 LAB — CBC WITH DIFFERENTIAL/PLATELET
BASO%: 0.7 % (ref 0.0–2.0)
BASOS ABS: 0 10*3/uL (ref 0.0–0.1)
EOS ABS: 0.1 10*3/uL (ref 0.0–0.5)
EOS%: 4.6 % (ref 0.0–7.0)
HEMATOCRIT: 23.7 % — AB (ref 38.4–49.9)
HEMOGLOBIN: 7.8 g/dL — AB (ref 13.0–17.1)
LYMPH#: 0.6 10*3/uL — AB (ref 0.9–3.3)
LYMPH%: 42.1 % (ref 14.0–49.0)
MCH: 32.8 pg (ref 27.2–33.4)
MCHC: 32.9 g/dL (ref 32.0–36.0)
MCV: 99.6 fL — AB (ref 79.3–98.0)
MONO#: 0.2 10*3/uL (ref 0.1–0.9)
MONO%: 11.8 % (ref 0.0–14.0)
NEUT#: 0.6 10*3/uL — ABNORMAL LOW (ref 1.5–6.5)
NEUT%: 40.8 % (ref 39.0–75.0)
PLATELETS: 45 10*3/uL — AB (ref 140–400)
RBC: 2.38 10*6/uL — ABNORMAL LOW (ref 4.20–5.82)
RDW: 18.5 % — ABNORMAL HIGH (ref 11.0–14.6)
WBC: 1.5 10*3/uL — ABNORMAL LOW (ref 4.0–10.3)
nRBC: 2 % — ABNORMAL HIGH (ref 0–0)

## 2015-01-06 LAB — COMPREHENSIVE METABOLIC PANEL (CC13)
ALT: 16 U/L (ref 0–55)
ANION GAP: 7 meq/L (ref 3–11)
AST: 57 U/L — ABNORMAL HIGH (ref 5–34)
Albumin: 3.6 g/dL (ref 3.5–5.0)
Alkaline Phosphatase: 70 U/L (ref 40–150)
BILIRUBIN TOTAL: 0.33 mg/dL (ref 0.20–1.20)
BUN: 14.6 mg/dL (ref 7.0–26.0)
CALCIUM: 9.2 mg/dL (ref 8.4–10.4)
CHLORIDE: 108 meq/L (ref 98–109)
CO2: 30 meq/L — AB (ref 22–29)
Creatinine: 1.1 mg/dL (ref 0.7–1.3)
EGFR: 68 mL/min/{1.73_m2} — AB (ref >90)
Glucose: 105 mg/dl (ref 70–140)
Potassium: 3.8 mEq/L (ref 3.5–5.1)
Sodium: 145 mEq/L (ref 136–145)
TOTAL PROTEIN: 6.5 g/dL (ref 6.4–8.3)

## 2015-01-11 ENCOUNTER — Ambulatory Visit (HOSPITAL_COMMUNITY)
Admission: RE | Admit: 2015-01-11 | Discharge: 2015-01-11 | Disposition: A | Discharge status: Home / Self Care | Payer: Medicare Other | Source: Ambulatory Visit | Attending: Internal Medicine | Admitting: Internal Medicine

## 2015-01-11 DIAGNOSIS — C3491 Malignant neoplasm of unspecified part of right bronchus or lung: Secondary | ICD-10-CM

## 2015-01-11 DIAGNOSIS — J438 Other emphysema: Secondary | ICD-10-CM | POA: Insufficient documentation

## 2015-01-11 DIAGNOSIS — Z720 Tobacco use: Secondary | ICD-10-CM | POA: Insufficient documentation

## 2015-01-11 MED ORDER — IOHEXOL 300 MG/ML  SOLN
100.0000 mL | Freq: Once | INTRAMUSCULAR | Status: AC | PRN
  Administered 2015-01-11: 100 mL via INTRAVENOUS

## 2015-01-13 ENCOUNTER — Ambulatory Visit (HOSPITAL_BASED_OUTPATIENT_CLINIC_OR_DEPARTMENT_OTHER): Payer: Medicare Other | Admitting: Internal Medicine

## 2015-01-13 ENCOUNTER — Encounter: Payer: Self-pay | Admitting: *Deleted

## 2015-01-13 ENCOUNTER — Ambulatory Visit: Payer: Medicare Other

## 2015-01-13 ENCOUNTER — Encounter: Payer: Self-pay | Admitting: Internal Medicine

## 2015-01-13 ENCOUNTER — Telehealth: Payer: Self-pay | Admitting: Internal Medicine

## 2015-01-13 ENCOUNTER — Other Ambulatory Visit (HOSPITAL_BASED_OUTPATIENT_CLINIC_OR_DEPARTMENT_OTHER): Payer: Medicare Other

## 2015-01-13 VITALS — BP 134/62 | HR 86 | Temp 98.3°F | Resp 17 | Ht 68.0 in | Wt 198.3 lb

## 2015-01-13 DIAGNOSIS — C3491 Malignant neoplasm of unspecified part of right bronchus or lung: Secondary | ICD-10-CM

## 2015-01-13 DIAGNOSIS — D72819 Decreased white blood cell count, unspecified: Secondary | ICD-10-CM

## 2015-01-13 DIAGNOSIS — Z720 Tobacco use: Secondary | ICD-10-CM

## 2015-01-13 DIAGNOSIS — D709 Neutropenia, unspecified: Secondary | ICD-10-CM | POA: Diagnosis not present

## 2015-01-13 LAB — COMPREHENSIVE METABOLIC PANEL (CC13)
ALBUMIN: 3.9 g/dL (ref 3.5–5.0)
ALK PHOS: 99 U/L (ref 40–150)
ALT: 16 U/L (ref 0–55)
AST: 63 U/L — AB (ref 5–34)
Anion Gap: 11 mEq/L (ref 3–11)
BUN: 7 mg/dL (ref 7.0–26.0)
CO2: 23 mEq/L (ref 22–29)
Calcium: 9.6 mg/dL (ref 8.4–10.4)
Chloride: 105 mEq/L (ref 98–109)
Creatinine: 1 mg/dL (ref 0.7–1.3)
EGFR: 74 mL/min/{1.73_m2} — ABNORMAL LOW (ref >90)
GLUCOSE: 82 mg/dL (ref 70–140)
POTASSIUM: 4.2 meq/L (ref 3.5–5.1)
SODIUM: 140 meq/L (ref 136–145)
TOTAL PROTEIN: 7.2 g/dL (ref 6.4–8.3)
Total Bilirubin: 0.41 mg/dL (ref 0.20–1.20)

## 2015-01-13 LAB — CBC WITH DIFFERENTIAL/PLATELET
BASO%: 1 % (ref 0.0–2.0)
Basophils Absolute: 0 10*3/uL (ref 0.0–0.1)
EOS%: 6.9 % (ref 0.0–7.0)
Eosinophils Absolute: 0.1 10*3/uL (ref 0.0–0.5)
HCT: 30.5 % — ABNORMAL LOW (ref 38.4–49.9)
HEMOGLOBIN: 10.2 g/dL — AB (ref 13.0–17.1)
LYMPH%: 28.1 % (ref 14.0–49.0)
MCH: 34.8 pg — AB (ref 27.2–33.4)
MCHC: 33.4 g/dL (ref 32.0–36.0)
MCV: 104.2 fL — ABNORMAL HIGH (ref 79.3–98.0)
MONO#: 0.7 10*3/uL (ref 0.1–0.9)
MONO%: 33.1 % — AB (ref 0.0–14.0)
NEUT%: 30.9 % — ABNORMAL LOW (ref 39.0–75.0)
NEUTROS ABS: 0.6 10*3/uL — AB (ref 1.5–6.5)
Platelets: 192 10*3/uL (ref 140–400)
RBC: 2.93 10*6/uL — ABNORMAL LOW (ref 4.20–5.82)
RDW: 23.8 % — AB (ref 11.0–14.6)
WBC: 2 10*3/uL — AB (ref 4.0–10.3)
lymph#: 0.6 10*3/uL — ABNORMAL LOW (ref 0.9–3.3)

## 2015-01-13 NOTE — Progress Notes (Signed)
Pt treatment cancelled.

## 2015-01-13 NOTE — Telephone Encounter (Signed)
Gave patient avs report and appointments for January. [facility] will call re ct - patient aware.

## 2015-01-13 NOTE — Progress Notes (Signed)
[place] [phone]   [phone]  OFFICE PROGRESS NOTE  Leonard Downing, MD [address] [place]  [address]   DIAGNOSIS: Adenocarcinoma of right lung  Staging form: Lung, AJCC 7th Edition  Clinical stage from 08/13/2014: Stage IV (T2a, N2, M1a) - Signed by Curt Bears, MD on 08/15/2014  PRIOR THERAPY: Systemic chemotherapy carboplatin for AUC of 5 and Alimta at 500 mg/m given every 3 weeks. Status post 5 cycles.  CURRENT THERAPY: Observation.  INTERVAL HISTORY: Steve Baker 74 y.o. male returns to the clinic today for follow-up visit accompanied by his wife. The patient completed 6 cycles of systemic chemotherapy with carboplatin and Alimta and tolerating his treatment fairly well with no significant adverse effects. He denied having any significant nausea or vomiting. He has no fever or chills. He denied having any significant chest pain but continues to have mild cough with shortness of breath with exertion and no hemoptysis. The patient denied having any significant weight loss or night sweats. He had repeat CT scan of the chest, abdomen and pelvis performed recently and he is here for evaluation and discussion of his scan results.  MEDICAL HISTORY: Past Medical History  Diagnosis Date  . Hyperlipidemia   . Irregular heart beat     Pt stated his heart skips a beat every now and then  . Shortness of breath dyspnea     with ambulation  . Peripheral vascular disease ([place])   . Anxiety   . Coronary artery disease   . Hypertension     lisinopril  . Dysrhythmia   . COPD (chronic obstructive pulmonary disease) ([place])   . History of bronchitis   . Anemia   . Prostate cancer ([place]) 2001    seeds  . Brain cancer ([place])     brain metastasis  . Lung cancer ([place])     adenocarcinoma of right lung  . Full code status 12/23/2014    ALLERGIES:  has No Known Allergies.  MEDICATIONS:  Current Outpatient Prescriptions    Medication Sig Dispense Refill  . aspirin EC 81 MG tablet Take 81 mg by mouth daily.    Marland Kitchen FeFum-FePoly-FA-B Cmp-C-Biot (INTEGRA PLUS) CAPS Take 1 capsule by mouth every morning. 30 capsule 2  . folic acid (FOLVITE) 1 MG tablet Take 1 tablet (1 mg total) by mouth daily. 30 tablet 4  . lisinopril (PRINIVIL,ZESTRIL) 20 MG tablet Take 20 mg by mouth daily.    . pravastatin (PRAVACHOL) 40 MG tablet Take 40 mg by mouth daily.    . Tiotropium Bromide Monohydrate (SPIRIVA RESPIMAT) 2.5 MCG/ACT AERS Inhale 2 puffs into the lungs daily. 1 Inhaler 4   No current facility-administered medications for this visit.    SURGICAL HISTORY:  Past Surgical History  Procedure Laterality Date  . Cholecystectomy    . Heart bypass    . Cardiac catheterization  2001  . Skin cancer excision N/A     Nose  . Abdominal aortic aneurysm repair    . Eye surgery Bilateral     cataract removal  . Colonoscopy w/ polypectomy    . Cardiac catheterization N/A 07/16/2014    Procedure: Right/Left Heart Cath and Coronary Angiography;  Surgeon: Burnell Blanks, MD;  Location: [facility];  Service: Cardiovascular;  Laterality: N/A;  . Coronary artery bypass graft    . Video bronchoscopy with endobronchial navigation N/A 07/29/2014    Procedure: VIDEO BRONCHOSCOPY WITH ENDOBRONCHIAL NAVIGATION;  Surgeon: Rose Fillers  Lamonte Sakai, MD;  Location: [facility];  Service: Thoracic;  Laterality: N/A;  . Endarterectomy femoral Left 08/19/2014    Procedure: PROFUNDA FEMORIS ENDARTERECTOMY ;  Surgeon: Rosetta Posner, MD;  Location: [facility];  Service: Vascular;  Laterality: Left;  . Femoral-popliteal bypass graft Left 08/19/2014    Procedure: FEMORAL-POPLITEAL ARTERY BYPASS GRAFT WITH PROPATEN GORTEX ;  Surgeon: Rosetta Posner, MD;  Location: [facility];  Service: Vascular;  Laterality: Left;  . Patch angioplasty Left 08/19/2014    Procedure: LEFT PROFUNDA FEMORIS PATCH ANGIOPLASTY USING HEMASHIELD PLATINUM FINESSE PATCH;  Surgeon: Rosetta Posner, MD;   Location: [facility];  Service: Vascular;  Laterality: Left;  . Peripheral vascular catheterization N/A 08/05/2014    Procedure: Abdominal Aortogram w/Lower Extremity;  Surgeon: Wellington Hampshire, MD;  Location: [facility];  Service: Cardiovascular;  Laterality: N/A;    REVIEW OF SYSTEMS:  Constitutional: negative Eyes: negative Ears, nose, mouth, throat, and face: negative Respiratory: positive for cough and dyspnea on exertion Cardiovascular: negative Gastrointestinal: negative Genitourinary:negative Integument/breast: negative Hematologic/lymphatic: negative Musculoskeletal:negative Neurological: negative Behavioral/Psych: positive for tobacco use Endocrine: negative Allergic/Immunologic: negative   PHYSICAL EXAMINATION: General appearance: alert, cooperative and no distress Head: Normocephalic, without obvious abnormality, atraumatic Neck: no adenopathy, no JVD, supple, symmetrical, trachea midline and thyroid not enlarged, symmetric, no tenderness/mass/nodules Lymph nodes: Cervical, supraclavicular, and axillary nodes normal. Resp: clear to auscultation bilaterally Back: symmetric, no curvature. ROM normal. No CVA tenderness. Cardio: regular rate and rhythm, S1, S2 normal, no murmur, click, rub or gallop GI: soft, non-tender; bowel sounds normal; no masses,  no organomegaly Extremities: extremities normal, atraumatic, no cyanosis or edema Neurologic: Alert and oriented X 3, normal strength and tone. Normal symmetric reflexes. Normal coordination and gait  ECOG PERFORMANCE STATUS: 1 - Symptomatic but completely ambulatory  Blood pressure 134/62, pulse 86, temperature 98.3 F (36.8 C), temperature source Oral, resp. rate 17, height '5\' 8"'$  (1.727 m), weight 198 lb 4.8 oz (89.948 kg), SpO2 98 %.  LABORATORY DATA: Lab Results  Component Value Date   WBC 2.0* 01/13/2015   HGB 10.2* 01/13/2015   HCT 30.5* 01/13/2015   MCV 104.2* 01/13/2015   PLT 192 01/13/2015       Chemistry      Component Value Date/Time   NA 140 01/13/2015 0844   NA 133* 08/21/2014 0312   K 4.2 01/13/2015 0844   K 4.1 08/21/2014 0312   CL 101 08/21/2014 0312   CO2 23 01/13/2015 0844   CO2 25 08/21/2014 0312   BUN 7.0 01/13/2015 0844   BUN 8 08/21/2014 0312   CREATININE 1.0 01/13/2015 0844   CREATININE 0.96 08/21/2014 0312      Component Value Date/Time   CALCIUM 9.6 01/13/2015 0844   CALCIUM 8.5* 08/21/2014 0312   ALKPHOS 99 01/13/2015 0844   ALKPHOS 82 08/18/2014 0945   AST 63* 01/13/2015 0844   AST 29 08/18/2014 0945   ALT 16 01/13/2015 0844   ALT 9* 08/18/2014 0945   BILITOT 0.41 01/13/2015 0844   BILITOT 0.4 08/18/2014 0945       RADIOGRAPHIC STUDIES: Ct Chest W Contrast  01/11/2015  CLINICAL DATA:  Right lung cancer, adenocarcinoma. Chemotherapy completed October 2016. EXAM: CT CHEST, ABDOMEN, AND PELVIS WITH CONTRAST TECHNIQUE: Multidetector CT imaging of the chest, abdomen and pelvis was performed following the standard protocol during bolus administration of intravenous contrast. CONTRAST:  134m OMNIPAQUE IOHEXOL 300 MG/ML  SOLN COMPARISON:  11/05/2014. FINDINGS: CT CHEST FINDINGS Mediastinum/Nodes: Lower pole right  thyroid is somewhat heterogeneous. No pathologically enlarged mediastinal, hilar or axillary lymph nodes. Heart size normal. No pericardial effusion. Lungs/Pleura: Spiculated nodule in the apical segment right upper lobe measures 2.3 x 2.9 cm, likely stable. 12 mm spiculated left lower lobe nodule (image 41), also stable. Mild centrilobular emphysema. 2 mm right lower lobe nodule (image 37), unchanged. There may be a metallic clip in the left lower lobe. No pleural fluid. Airway is unremarkable. Musculoskeletal: No worrisome lytic or sclerotic lesions. CT ABDOMEN PELVIS FINDINGS Hepatobiliary: Liver is unremarkable. Cholecystectomy. No biliary ductal dilatation. Pancreas: Negative. Spleen: 8 mm low-attenuation lesion in the inferior spleen, unchanged and  likely a cyst or hemangioma. Adrenals/Urinary Tract: Adrenal glands and right kidney are unremarkable. Renal vascular calcifications bilaterally. There may be a punctate stone in the left kidney. Ureters are decompressed. Bladder is low in volume. Stomach/Bowel: Tiny hiatal hernia. Stomach small bowel, appendix and colon are unremarkable. Vascular/Lymphatic: Atherosclerotic calcification of the arterial vasculature without abdominal aortic aneurysm. Aorto bi-iliac bypass graft with patent iliac limbs. No pathologically enlarged lymph nodes. Reproductive: Brachytherapy seeds are seen in the prostate bed. Other: Small right [place], midline supraumbilical, periumbilical and bilateral inguinal hernias contain fat. Musculoskeletal: No worrisome lytic or sclerotic lesions. IMPRESSION: 1. Right upper and left lower lobe nodules are unchanged from 11/05/2014. 2. Possible punctate stone in the left kidney. Electronically Signed   By: Lorin Picket M.D.   On: 01/11/2015 13:04   Mr Jeri Cos DQ Contrast  12/17/2014  CLINICAL DATA:  Lung cancer. Brain metastases. The patient has completed chemotherapy. EXAM: MRI HEAD WITHOUT AND WITH CONTRAST TECHNIQUE: Multiplanar, multiecho pulse sequences of the brain and surrounding structures were obtained without and with intravenous contrast. CONTRAST:  20 mi MultiHance. COMPARISON:  MRI brain 09/18/2014 FINDINGS: The previously noted punctate focus of enhancement within the left occipital lobe is no longer present. Given the interval appearance of this lesion, this may have been a focus of previous ischemia versus a tiny subacute infarct. Periventricular and subcortical T2 changes are more prominent on today's study. Moderate generalized atrophy is similar to the prior exam. The ventricles are proportionate to the degree of atrophy. No significant extra-axial fluid collection is present. Flow is present in the major intracranial arteries. Bilateral lens replacements are present.  The paranasal sinuses are clear. There is no significant fluid in the mastoid air cells. Skullbase is within normal limits. Midline structures are unremarkable. No pathologic enhancement is present to suggest metastatic disease of the brain or meninges. IMPRESSION: 1. Interval resolution of punctate focus of enhancement in the left occipital lobe. This was more likely ischemic than the treated metastasis. 2. No other significant enhancement to suggest metastatic disease elsewhere. 3. Progressive periventricular and subcortical white matter changes. This may reflect the sequelae of treatment. Electronically Signed   By: San Morelle M.D.   On: 12/17/2014 14:58   Ct Abdomen Pelvis W Contrast  01/11/2015  CLINICAL DATA:  Right lung cancer, adenocarcinoma. Chemotherapy completed October 2016. EXAM: CT CHEST, ABDOMEN, AND PELVIS WITH CONTRAST TECHNIQUE: Multidetector CT imaging of the chest, abdomen and pelvis was performed following the standard protocol during bolus administration of intravenous contrast. CONTRAST:  14m OMNIPAQUE IOHEXOL 300 MG/ML  SOLN COMPARISON:  11/05/2014. FINDINGS: CT CHEST FINDINGS Mediastinum/Nodes: Lower pole right thyroid is somewhat heterogeneous. No pathologically enlarged mediastinal, hilar or axillary lymph nodes. Heart size normal. No pericardial effusion. Lungs/Pleura: Spiculated nodule in the apical segment right upper lobe measures 2.3 x 2.9 cm, likely stable. 12 mm  spiculated left lower lobe nodule (image 41), also stable. Mild centrilobular emphysema. 2 mm right lower lobe nodule (image 37), unchanged. There may be a metallic clip in the left lower lobe. No pleural fluid. Airway is unremarkable. Musculoskeletal: No worrisome lytic or sclerotic lesions. CT ABDOMEN PELVIS FINDINGS Hepatobiliary: Liver is unremarkable. Cholecystectomy. No biliary ductal dilatation. Pancreas: Negative. Spleen: 8 mm low-attenuation lesion in the inferior spleen, unchanged and likely a cyst  or hemangioma. Adrenals/Urinary Tract: Adrenal glands and right kidney are unremarkable. Renal vascular calcifications bilaterally. There may be a punctate stone in the left kidney. Ureters are decompressed. Bladder is low in volume. Stomach/Bowel: Tiny hiatal hernia. Stomach small bowel, appendix and colon are unremarkable. Vascular/Lymphatic: Atherosclerotic calcification of the arterial vasculature without abdominal aortic aneurysm. Aorto bi-iliac bypass graft with patent iliac limbs. No pathologically enlarged lymph nodes. Reproductive: Brachytherapy seeds are seen in the prostate bed. Other: Small right [place]dale[place], midline supraumbilical, periumbilical and bilateral inguinal hernias contain fat. Musculoskeletal: No worrisome lytic or sclerotic lesions. IMPRESSION: 1. Right upper and left lower lobe nodules are unchanged from 11/05/2014. 2. Possible punctate stone in the left kidney. Electronically Signed   By: Lorin Picket M.D.   On: 01/11/2015 13:04    ASSESSMENT AND PLAN: This is a very pleasant 74 years old white male with a stage IV non-small cell lung cancer status post 6 cycles of systemic chemotherapy with carboplatin and Alimta. The patient tolerated his current treatment fairly well with no significant adverse effect except for mild fatigue. The recent CT scan of the chest, abdomen and pelvis showed no evidence for disease progression. I discussed the scan results with the patient and his wife. I gave him the option of maintenance systemic chemotherapy with single agent Alimta versus taking a break off chemotherapy for the next 2 months to enjoy the holiday with his family. The patient is interested in taking a break off chemotherapy for now. I will see him back for follow-up visit in 2 months for reevaluation with repeat CT scan of the chest, abdomen and pelvis for restaging of his disease. The patient continues to have leukocytopenia and neutropenia. We will continue to monitor this closely  and this is expected to recover in the next few weeks. For smoke cessation, I strongly encouraged the patient to quit smoking and offered him a smoke cessation program. CODE STATUS: he is full code for now. The patient was advised to call immediately if he has any concerning symptoms in the interval. The patient voices understanding of current disease status and treatment options and is in agreement with the current care plan.  All questions were answered. The patient knows to call the clinic with any problems, questions or concerns. We can certainly see the patient much sooner if necessary.  Disclaimer: This note was dictated with voice recognition software. Similar sounding words can inadvertently be transcribed and may not be corrected upon review.

## 2015-01-13 NOTE — Progress Notes (Signed)
Oncology Nurse Navigator Documentation  Oncology Nurse Navigator Flowsheets 01/13/2015  Navigator Encounter Type Clinic/MDC/spoke with patient and wife today at cancer center.  He is going to be on observation and have scan after the holidays.  He is doing and feeling well today.  I asked that he call if needed   Patient Visit Type Follow-up  Treatment Phase Treatment  Barriers/Navigation Needs No barriers at this time  Time Spent with Patient 15

## 2015-02-18 ENCOUNTER — Ambulatory Visit (INDEPENDENT_AMBULATORY_CARE_PROVIDER_SITE_OTHER): Payer: Medicare Other | Admitting: Cardiology

## 2015-02-18 ENCOUNTER — Other Ambulatory Visit: Payer: Self-pay | Admitting: Nurse Practitioner

## 2015-02-18 ENCOUNTER — Encounter: Payer: Self-pay | Admitting: Cardiology

## 2015-02-18 VITALS — BP 138/70 | HR 98 | Ht 68.0 in | Wt 194.8 lb

## 2015-02-18 DIAGNOSIS — I2510 Atherosclerotic heart disease of native coronary artery without angina pectoris: Secondary | ICD-10-CM | POA: Diagnosis not present

## 2015-02-18 DIAGNOSIS — I739 Peripheral vascular disease, unspecified: Secondary | ICD-10-CM | POA: Diagnosis not present

## 2015-02-18 DIAGNOSIS — I5022 Chronic systolic (congestive) heart failure: Secondary | ICD-10-CM

## 2015-02-18 DIAGNOSIS — I10 Essential (primary) hypertension: Secondary | ICD-10-CM | POA: Diagnosis not present

## 2015-02-18 DIAGNOSIS — E785 Hyperlipidemia, unspecified: Secondary | ICD-10-CM

## 2015-02-18 DIAGNOSIS — Z720 Tobacco use: Secondary | ICD-10-CM

## 2015-02-18 DIAGNOSIS — I420 Dilated cardiomyopathy: Secondary | ICD-10-CM

## 2015-02-18 DIAGNOSIS — I255 Ischemic cardiomyopathy: Secondary | ICD-10-CM | POA: Insufficient documentation

## 2015-02-18 DIAGNOSIS — I2583 Coronary atherosclerosis due to lipid rich plaque: Principal | ICD-10-CM

## 2015-02-18 HISTORY — DX: Hyperlipidemia, unspecified: E78.5

## 2015-02-18 HISTORY — DX: Dilated cardiomyopathy: I42.0

## 2015-02-18 LAB — LIPID PANEL
CHOL/HDL RATIO: 3.5 ratio (ref <=5.0)
Cholesterol: 216 mg/dL — ABNORMAL HIGH (ref 125–200)
HDL: 61 mg/dL (ref >=40)
LDL CALC: 136 mg/dL — AB (ref <130)
Triglycerides: 93 mg/dL (ref <150)
VLDL: 19 mg/dL (ref <30)

## 2015-02-18 LAB — BASIC METABOLIC PANEL
BUN: 8 mg/dL (ref 7–25)
CALCIUM: 9.2 mg/dL (ref 8.6–10.3)
CO2: 27 mmol/L (ref 20–31)
Chloride: 103 mmol/L (ref 98–110)
Creat: 1.17 mg/dL (ref 0.70–1.18)
Glucose, Bld: 63 mg/dL — ABNORMAL LOW (ref 65–99)
Potassium: 3.8 mmol/L (ref 3.5–5.3)
SODIUM: 140 mmol/L (ref 135–146)

## 2015-02-18 LAB — HEPATIC FUNCTION PANEL
ALBUMIN: 3.9 g/dL (ref 3.6–5.1)
ALK PHOS: 69 U/L (ref 40–115)
ALT: 8 U/L — ABNORMAL LOW (ref 9–46)
AST: 38 U/L — ABNORMAL HIGH (ref 10–35)
BILIRUBIN TOTAL: 0.4 mg/dL (ref 0.2–1.2)
Total Protein: 6.9 g/dL (ref 6.1–8.1)

## 2015-02-18 MED ORDER — CARVEDILOL 3.125 MG PO TABS: 3.1250 mg | ORAL_TABLET | Freq: Two times a day (BID) | ORAL | Status: DC

## 2015-02-18 MED ORDER — SPIRONOLACTONE 25 MG PO TABS: 25.0000 mg | ORAL_TABLET | Freq: Every day | ORAL | Status: DC

## 2015-02-18 NOTE — Progress Notes (Addendum)
Cardiology Office Note   Date:  02/18/2015   ID:  Steve Baker, DOB 07/28/1940, MRN [id number]  PCP:  Leonard Downing, MD    No chief complaint on file.     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 Fletcher Anon, prostate CA (2001), hyperlipidemia and stage IV non-small cell lung CA.  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  He was seen by Dr. Fletcher Anon due to severe claudication and subsequently underwent left profundus femoral endarterectomy and patch angioplasty and left femoral to above the knee popliteal bypass by Dr. Donnetta Hutching.  He has had complete resolution of the leg pain since peripheral revascularization.  He denies any chest pain or pressure.  He has chronic DOE that is stable.    He denies any LE edema, palpitations or syncope.     Past Medical History  Diagnosis Date  . Hyperlipidemia   . Irregular heart beat     Pt stated his heart skips a beat every now and then  . Shortness of breath dyspnea     with ambulation  . Peripheral vascular disease ([facility] [facility])     s/p aortobifemoral bypass and left profundus femoral endarterectomy with patch angioplasty and left femoral to above the knee popliteal bypass  . Anxiety   . Hypertension     lisinopril  . Dysrhythmia   . COPD (chronic obstructive pulmonary disease) ([facility])   . History of bronchitis   . Anemia   . Prostate cancer ([facility]) 2001    seeds  . Brain cancer ([facility])     brain metastasis  . Lung cancer ([facility])     adenocarcinoma of right lung  . Full code status 12/23/2014  . Hyperlipidemia LDL goal <70 02/18/2015  . DCM (dilated cardiomyopathy) ([facility]) 02/18/2015   . Coronary artery disease     s/p CABG now with 2/4 grafts patent by cath 2016 - medical management    Past Surgical History  Procedure Laterality Date  . Cholecystectomy    . Heart bypass    . Cardiac catheterization  2001  . Skin cancer excision N/A     Nose  . Abdominal aortic aneurysm repair    . Eye surgery Bilateral     cataract removal  . Colonoscopy w/ polypectomy    . Cardiac catheterization N/A 07/16/2014    Procedure: Right/Left Heart Cath and Coronary Angiography;  Surgeon: Burnell Blanks, MD;  Location: [facility];  Service: Cardiovascular;  Laterality: N/A;  . Coronary artery bypass graft    . Video bronchoscopy with endobronchial navigation N/A 07/29/2014    Procedure: VIDEO BRONCHOSCOPY WITH ENDOBRONCHIAL NAVIGATION;  Surgeon: Collene Gobble, MD;  Location: [facility];  Service: Thoracic;  Laterality: N/A;  . Endarterectomy femoral Left 08/19/2014    Procedure: PROFUNDA FEMORIS ENDARTERECTOMY ;  Surgeon: Rosetta Posner, MD;  Location: [facility];  Service: Vascular;  Laterality: Left;  . Femoral-popliteal bypass graft Left 08/19/2014    Procedure: FEMORAL-POPLITEAL ARTERY BYPASS GRAFT WITH PROPATEN GORTEX ;  Surgeon: Rosetta Posner, MD;  Location: [facility];  Service: Vascular;  Laterality: Left;  . Patch angioplasty Left 08/19/2014    Procedure: LEFT PROFUNDA FEMORIS PATCH ANGIOPLASTY USING HEMASHIELD PLATINUM FINESSE PATCH;  Surgeon: Rosetta Posner, MD;  Location: [facility];  Service: Vascular;  Laterality: Left;  . Peripheral vascular catheterization N/A 08/05/2014    Procedure: Abdominal Aortogram w/Lower Extremity;  Surgeon: Wellington Hampshire, MD;  Location: [facility];  Service: Cardiovascular;  Laterality: N/A;     Current Outpatient Prescriptions  Medication Sig Dispense Refill  . aspirin EC 81 MG tablet Take 81 mg by mouth daily.    Marland Kitchen FeFum-FePoly-FA-B Cmp-C-Biot (INTEGRA PLUS) CAPS Take 1 capsule by mouth every morning. 30 capsule 2  . folic acid (FOLVITE) 1 MG  tablet Take 1 tablet (1 mg total) by mouth daily. 30 tablet 4  . lisinopril (PRINIVIL,ZESTRIL) 20 MG tablet Take 20 mg by mouth daily.    . pravastatin (PRAVACHOL) 40 MG tablet Take 40 mg by mouth daily.    . Tiotropium Bromide Monohydrate (SPIRIVA RESPIMAT) 2.5 MCG/ACT AERS Inhale 2 puffs into the lungs daily. 1 Inhaler 4  . carvedilol (COREG) 3.125 MG tablet Take 1 tablet (3.125 mg total) by mouth 2 (two) times daily with a meal. 60 tablet 11  . spironolactone (ALDACTONE) 25 MG tablet Take 1 tablet (25 mg total) by mouth daily. 30 tablet 11   No current facility-administered medications for this visit.    Allergies:   Review of patient's allergies indicates no known allergies.    Social History:  The patient  reports that he has been smoking Cigarettes.  He has a 85.5 pack-year smoking history. He has never used smokeless tobacco. He reports that he drinks about 1.8 oz of alcohol per week. He reports that he does not use illicit drugs.   Family History:  The patient's family history includes Heart Problems in his mother; Heart attack in his father. There is no history of Stroke.    ROS:  Please see the history of present illness.   Otherwise, review of systems are positive for none.   All other systems are reviewed and negative.    PHYSICAL EXAM: VS:  BP 138/70 mmHg  Pulse 98  Ht '5\' 8"'$  (1.727 m)  Wt 194 lb 12.8 oz (88.361 kg)  BMI 29.63 kg/m2  SpO2 96% , BMI Body mass index is 29.63 kg/(m^2). GEN: Well nourished, well developed, in no acute distress HEENT: normal Neck: no JVD, carotid bruits, or masses Cardiac: RRR; no murmurs, rubs, or gallops,no edema  Respiratory:  clear to auscultation bilaterally, normal work of breathing GI: soft, nontender, nondistended, + BS MS: no deformity or atrophy Skin: warm and dry, no rash Neuro:  Strength and sensation are intact Psych: euthymic mood, full affect   EKG:  EKG is not ordered today.    Recent Labs: 01/13/2015: HGB 10.2*;  Platelets 192 02/18/2015: ALT 8*; BUN 8; Creat 1.17; Potassium 3.8; Sodium 140    Lipid Panel    Component Value Date/Time   CHOL 216* 02/18/2015 1135   TRIG 93 02/18/2015 1135   HDL 61 02/18/2015 1135   CHOLHDL 3.5 02/18/2015 1135   VLDL 19 02/18/2015 1135   LDLCALC 136* 02/18/2015 1135      Wt Readings from Last 3 Encounters:  02/18/15 194 lb 12.8 oz (88.361 kg)  01/13/15 198 lb 4.8 oz (89.948 kg)  12/23/14 193 lb (87.544 kg)    ASSESSMENT AND PLAN:  Coronary artery disease involving native coronary artery of native heart  without angina pectoris - s/p cath with 2/4 grafts patent on medical management.  He has no again at this time.  Continue ASA/statin/BB  Ischemic Cardiomyopathy EF 24% by cath and nuclear stress test. Continue ACE inhibitor.  Add coreg 3.'125mg'$  BID and Aldactone '25mg'$  daily.  Recheck echo in 2 months on medical therapy.  We discussed possible ICD if EF did not improve, although given his underlying lung malignancy that is stage IV, he would probably not be a candidate.  Check BMET today and in 1 week after starting aldactone.  Essential hypertension BP controlled on current regimen.  Hyperlipidemia Continue statin. Check FLP and ALT.  Tobacco abuse He is working on quitting.  Lung mass S/p surgical resection.  Chronic obstructive pulmonary disease, unspecified COPD, unspecified chronic bronchitis type Continue Spiriva.  Edema  Chronic. He takes Lasix prn.  PVD with remote AAA s/p prior aortobifemoral bypass.  Now s/p left profundus femoral endarterectomy and patch angioplasty and left femoral to above the knee popliteal bypass with resolution of claudication.     Current medicines are reviewed at length with the patient today.  The patient does not have concerns regarding medicines.  The following changes have been made:  Add Coreg 3.'125mg'$  BID and Aldactone '25mg'$  daily.    Labs/ tests ordered today: See above Assessment and Plan  Orders  Placed This Encounter  Procedures  . Basic metabolic panel  . Hepatic function panel  . Lipid panel  . Basic metabolic panel  . ECHOCARDIOGRAM COMPLETE     Disposition:   FU with me in 3 months and PA in 3 weeks  Signed, Sueanne Margarita, MD  02/18/2015 8:18 PM    [facility] [place], [place], [place]  [address] Phone: [phone]; Fax: [phone]

## 2015-02-18 NOTE — Patient Instructions (Signed)
Medication Instructions:  Your physician has recommended you make the following change in your medication:  1) START COREG 3.125 mg TWICE DAILY 2) START ALDACTONE 25 mg daily  Labwork: TODAY: BMET, LFTs, Lipids  IN ONE WEEK: BMET  Testing/Procedures: Your physician has requested that you have an echocardiogram in 2 months. Echocardiography is a painless test that uses sound waves to create images of your heart. It provides your doctor with information about the size and shape of your heart and how well your heart's chambers and valves are working. This procedure takes approximately one hour. There are no restrictions for this procedure.  Follow-Up: Your physician recommends that you schedule a follow-up appointment in 3 weeks with a PA or NP.  Your physician recommends that you schedule a follow-up appointment in: 3 months with Dr. Radford Pax.  Any Other Special Instructions Will Be Listed Below (If Applicable).     If you need a refill on your cardiac medications before your next appointment, please call your pharmacy.

## 2015-02-19 ENCOUNTER — Telehealth: Payer: Self-pay | Admitting: *Deleted

## 2015-02-19 DIAGNOSIS — E785 Hyperlipidemia, unspecified: Secondary | ICD-10-CM

## 2015-02-19 MED ORDER — PRAVASTATIN SODIUM 80 MG PO TABS: 80.0000 mg | ORAL_TABLET | Freq: Every evening | ORAL | Status: DC

## 2015-02-19 NOTE — Telephone Encounter (Signed)
Pt gave verbal permission to speak to his wife. Informed wife of results and new orders. Scheduled labs for 04/01/15. Wife verbalized understanding and was in agreement with this plan.

## 2015-02-19 NOTE — Telephone Encounter (Signed)
-----   Message from Sueanne Margarita, MD sent at 02/18/2015  8:59 PM EST ----- LDL not at goal.  Please increase pravastatin to '80mg'$  daily and recheck FLP and ALT in 6 weeks

## 2015-02-25 ENCOUNTER — Other Ambulatory Visit (INDEPENDENT_AMBULATORY_CARE_PROVIDER_SITE_OTHER): Payer: Medicare Other | Admitting: *Deleted

## 2015-02-25 ENCOUNTER — Other Ambulatory Visit: Payer: Self-pay | Admitting: *Deleted

## 2015-02-25 DIAGNOSIS — I2583 Coronary atherosclerosis due to lipid rich plaque: Principal | ICD-10-CM

## 2015-02-25 DIAGNOSIS — I2510 Atherosclerotic heart disease of native coronary artery without angina pectoris: Secondary | ICD-10-CM | POA: Diagnosis not present

## 2015-02-25 DIAGNOSIS — I420 Dilated cardiomyopathy: Secondary | ICD-10-CM

## 2015-02-25 DIAGNOSIS — I5022 Chronic systolic (congestive) heart failure: Secondary | ICD-10-CM | POA: Diagnosis not present

## 2015-02-25 DIAGNOSIS — I10 Essential (primary) hypertension: Secondary | ICD-10-CM | POA: Diagnosis not present

## 2015-02-25 DIAGNOSIS — E785 Hyperlipidemia, unspecified: Secondary | ICD-10-CM

## 2015-02-25 LAB — BASIC METABOLIC PANEL
BUN: 14 mg/dL (ref 7–25)
CHLORIDE: 103 mmol/L (ref 98–110)
CO2: 26 mmol/L (ref 20–31)
CREATININE: 1.36 mg/dL — AB (ref 0.70–1.18)
Calcium: 8.9 mg/dL (ref 8.6–10.3)
Glucose, Bld: 87 mg/dL (ref 65–99)
Potassium: 4.7 mmol/L (ref 3.5–5.3)
Sodium: 137 mmol/L (ref 135–146)

## 2015-03-11 ENCOUNTER — Encounter: Payer: Self-pay | Admitting: Cardiology

## 2015-03-11 ENCOUNTER — Ambulatory Visit (INDEPENDENT_AMBULATORY_CARE_PROVIDER_SITE_OTHER): Payer: Medicare Other | Admitting: Cardiology

## 2015-03-11 ENCOUNTER — Other Ambulatory Visit (HOSPITAL_BASED_OUTPATIENT_CLINIC_OR_DEPARTMENT_OTHER): Payer: Medicare Other

## 2015-03-11 VITALS — BP 142/82 | HR 60 | Ht 68.0 in | Wt 198.4 lb

## 2015-03-11 DIAGNOSIS — C3491 Malignant neoplasm of unspecified part of right bronchus or lung: Secondary | ICD-10-CM | POA: Diagnosis not present

## 2015-03-11 DIAGNOSIS — Z79899 Other long term (current) drug therapy: Secondary | ICD-10-CM | POA: Diagnosis not present

## 2015-03-11 LAB — COMPREHENSIVE METABOLIC PANEL
ALBUMIN: 4 g/dL (ref 3.5–5.0)
ALK PHOS: 78 U/L (ref 40–150)
ALT: 12 U/L (ref 0–55)
ANION GAP: 7 meq/L (ref 3–11)
AST: 39 U/L — ABNORMAL HIGH (ref 5–34)
BILIRUBIN TOTAL: 0.34 mg/dL (ref 0.20–1.20)
BUN: 16.5 mg/dL (ref 7.0–26.0)
CALCIUM: 9.2 mg/dL (ref 8.4–10.4)
CO2: 26 mEq/L (ref 22–29)
CREATININE: 1.2 mg/dL (ref 0.7–1.3)
Chloride: 104 mEq/L (ref 98–109)
EGFR: 61 mL/min/{1.73_m2} — ABNORMAL LOW (ref >90)
Glucose: 93 mg/dl (ref 70–140)
Potassium: 4.6 mEq/L (ref 3.5–5.1)
Sodium: 137 mEq/L (ref 136–145)
TOTAL PROTEIN: 7.3 g/dL (ref 6.4–8.3)

## 2015-03-11 LAB — CBC WITH DIFFERENTIAL/PLATELET
BASO%: 0.5 % (ref 0.0–2.0)
Basophils Absolute: 0 10*3/uL (ref 0.0–0.1)
EOS%: 5.8 % (ref 0.0–7.0)
Eosinophils Absolute: 0.3 10*3/uL (ref 0.0–0.5)
HEMATOCRIT: 38.7 % (ref 38.4–49.9)
HGB: 12.5 g/dL — ABNORMAL LOW (ref 13.0–17.1)
LYMPH#: 1.2 10*3/uL (ref 0.9–3.3)
LYMPH%: 26.8 % (ref 14.0–49.0)
MCH: 32.6 pg (ref 27.2–33.4)
MCHC: 32.3 g/dL (ref 32.0–36.0)
MCV: 100.8 fL — ABNORMAL HIGH (ref 79.3–98.0)
MONO#: 0.6 10*3/uL (ref 0.1–0.9)
MONO%: 12.8 % (ref 0.0–14.0)
NEUT%: 54.1 % (ref 39.0–75.0)
NEUTROS ABS: 2.3 10*3/uL (ref 1.5–6.5)
PLATELETS: 143 10*3/uL (ref 140–400)
RBC: 3.84 10*6/uL — ABNORMAL LOW (ref 4.20–5.82)
RDW: 14.2 % (ref 11.0–14.6)
WBC: 4.3 10*3/uL (ref 4.0–10.3)

## 2015-03-11 MED ORDER — LISINOPRIL 40 MG PO TABS: 40.0000 mg | ORAL_TABLET | Freq: Every day | ORAL | Status: DC

## 2015-03-11 NOTE — Patient Instructions (Signed)
Medication Instructions:  Your physician has recommended you make the following change in your medication:  1- Increase lisinopril 40 mg by mouth daily.  Labwork: Your physician recommends that you return for lab work in: 2 weeks for a BMET.   Testing/Procedures: NONE  Follow-Up: Your physician recommends that you schedule a follow-up appointment in: 2 weeks with APP for medication adjustments for heart failure  Your physician recommends that you weigh, daily, at the same time every day, and in the same amount of clothing. Call our office if you have a weight gain of 3 pounds in 24 hours and 5 pounds in 1 week.  Your physician recommends that you call the office if you have light headedness, dizziness, or any shortness of breath.         If you need a refill on your cardiac medications before your next appointment, please call your pharmacy.  Low-Sodium Eating Plan Sodium raises blood pressure and causes water to be held in the body. Getting less sodium from food will help lower your blood pressure, reduce any swelling, and protect your heart, liver, and kidneys. We get sodium by adding salt (sodium chloride) to food. Most of our sodium comes from canned, boxed, and frozen foods. Restaurant foods, fast foods, and pizza are also very high in sodium. Even if you take medicine to lower your blood pressure or to reduce fluid in your body, getting less sodium from your food is important. WHAT IS MY PLAN? Most people should limit their sodium intake to 2,300 mg a day. Your health care provider recommends that you limit your sodium intake to __________ a day.  WHAT DO I NEED TO KNOW ABOUT THIS EATING PLAN? For the low-sodium eating plan, you will follow these general guidelines:  Choose foods with a % Daily Value for sodium of less than 5% (as listed on the food label).   Use salt-free seasonings or herbs instead of table salt or sea salt.   Check with your health care provider or  pharmacist before using salt substitutes.   Eat fresh foods.  Eat more vegetables and fruits.  Limit canned vegetables. If you do use them, rinse them well to decrease the sodium.   Limit cheese to 1 oz (28 g) per day.   Eat lower-sodium products, often labeled as "lower sodium" or "no salt added."  Avoid foods that contain monosodium glutamate (MSG). MSG is sometimes added to [place] food and some canned foods.  Check food labels (Nutrition Facts labels) on foods to learn how much sodium is in one serving.  Eat more home-cooked food and less restaurant, buffet, and fast food.  When eating at a restaurant, ask that your food be prepared with less salt, or no salt if possible.  HOW DO I READ FOOD LABELS FOR SODIUM INFORMATION? The Nutrition Facts label lists the amount of sodium in one serving of the food. If you eat more than one serving, you must multiply the listed amount of sodium by the number of servings. Food labels may also identify foods as:  Sodium free--Less than 5 mg in a serving.  Very low sodium--35 mg or less in a serving.  Low sodium--140 mg or less in a serving.  Light in sodium--50% less sodium in a serving. For example, if a food that usually has 300 mg of sodium is changed to become light in sodium, it will have 150 mg of sodium.  Reduced sodium--25% less sodium in a serving. For example, if a  food that usually has 400 mg of sodium is changed to reduced sodium, it will have 300 mg of sodium. WHAT FOODS CAN I EAT? Grains Low-sodium cereals, including oats, puffed wheat and rice, and shredded wheat cereals. Low-sodium crackers. Unsalted rice and pasta. Lower-sodium bread.  Vegetables Frozen or fresh vegetables. Low-sodium or reduced-sodium canned vegetables. Low-sodium or reduced-sodium tomato sauce and paste. Low-sodium or reduced-sodium tomato and vegetable juices.  Fruits Fresh, frozen, and canned fruit. Fruit juice.  Meat and Other Protein  Products Low-sodium canned tuna and salmon. Fresh or frozen meat, poultry, seafood, and fish. Lamb. Unsalted nuts. Dried beans, peas, and lentils without added salt. Unsalted canned beans. Homemade soups without salt. Eggs.  Dairy Milk. Soy milk. Ricotta cheese. Low-sodium or reduced-sodium cheeses. Yogurt.  Condiments Fresh and dried herbs and spices. Salt-free seasonings. Onion and garlic powders. Low-sodium varieties of mustard and ketchup. Fresh or refrigerated horseradish. Lemon juice.  Fats and Oils Reduced-sodium salad dressings. Unsalted butter.  Other Unsalted popcorn and pretzels.  The items listed above may not be a complete list of recommended foods or beverages. Contact your dietitian for more options. WHAT FOODS ARE NOT RECOMMENDED? Grains Instant hot cereals. Bread stuffing, pancake, and biscuit mixes. Croutons. Seasoned rice or pasta mixes. Noodle soup cups. Boxed or frozen macaroni and cheese. Self-rising flour. Regular salted crackers. Vegetables Regular canned vegetables. Regular canned tomato sauce and paste. Regular tomato and vegetable juices. Frozen vegetables in sauces. Salted [place] fries. Olives. Angie Fava. Relishes. Sauerkraut. Salsa. Meat and Other Protein Products Salted, canned, smoked, spiced, or pickled meats, seafood, or fish. Bacon, ham, sausage, hot dogs, corned beef, chipped beef, and packaged luncheon meats. Salt pork. Jerky. Pickled herring. Anchovies, regular canned tuna, and sardines. Salted nuts. Dairy Processed cheese and cheese spreads. Cheese curds. Blue cheese and cottage cheese. Buttermilk.  Condiments Onion and garlic salt, seasoned salt, table salt, and sea salt. Canned and packaged gravies. Worcestershire sauce. Tartar sauce. Barbecue sauce. Teriyaki sauce. Soy sauce, including reduced sodium. Steak sauce. Fish sauce. Oyster sauce. Cocktail sauce. Horseradish that you find on the shelf. Regular ketchup and mustard. Meat flavorings and  tenderizers. Bouillon cubes. Hot sauce. Tabasco sauce. Marinades. Taco seasonings. Relishes. Fats and Oils Regular salad dressings. Salted butter. Margarine. Ghee. Bacon fat.  Other Potato and tortilla chips. Corn chips and puffs. Salted popcorn and pretzels. Canned or dried soups. Pizza. Frozen entrees and pot pies.  The items listed above may not be a complete list of foods and beverages to avoid. Contact your dietitian for more information.   This information is not intended to replace advice given to you by your health care provider. Make sure you discuss any questions you have with your health care provider.   Document Released: 08/12/2001 Document Revised: 03/13/2014 Document Reviewed: 12/25/2012 Elsevier Interactive Patient Education 2016 [employer] Low-Sodium Eating Plan Sodium raises blood pressure and causes water to be held in the body. Getting less sodium from food will help lower your blood pressure, reduce any swelling, and protect your heart, liver, and kidneys. We get sodium by adding salt (sodium chloride) to food. Most of our sodium comes from canned, boxed, and frozen foods. Restaurant foods, fast foods, and pizza are also very high in sodium. Even if you take medicine to lower your blood pressure or to reduce fluid in your body, getting less sodium from your food is important. WHAT IS MY PLAN? Most people should limit their sodium intake to 2,000 mg a day. Your health care provider recommends that  you limit your sodium intake to _____2 grams_____ a day.  WHAT DO I NEED TO KNOW ABOUT THIS EATING PLAN? For the low-sodium eating plan, you will follow these general guidelines:  Choose foods with a % Daily Value for sodium of less than 5% (as listed on the food label).   Use salt-free seasonings or herbs instead of table salt or sea salt.   Check with your health care provider or pharmacist before using salt substitutes.   Eat fresh foods.  Eat more vegetables and  fruits.  Limit canned vegetables. If you do use them, rinse them well to decrease the sodium.   Limit cheese to 1 oz (28 g) per day.   Eat lower-sodium products, often labeled as "lower sodium" or "no salt added."  Avoid foods that contain monosodium glutamate (MSG). MSG is sometimes added to [place] food and some canned foods.  Check food labels (Nutrition Facts labels) on foods to learn how much sodium is in one serving.  Eat more home-cooked food and less restaurant, buffet, and fast food.  When eating at a restaurant, ask that your food be prepared with less salt, or no salt if possible.  HOW DO I READ FOOD LABELS FOR SODIUM INFORMATION? The Nutrition Facts label lists the amount of sodium in one serving of the food. If you eat more than one serving, you must multiply the listed amount of sodium by the number of servings. Food labels may also identify foods as:  Sodium free--Less than 5 mg in a serving.  Very low sodium--35 mg or less in a serving.  Low sodium--140 mg or less in a serving.  Light in sodium--50% less sodium in a serving. For example, if a food that usually has 300 mg of sodium is changed to become light in sodium, it will have 150 mg of sodium.  Reduced sodium--25% less sodium in a serving. For example, if a food that usually has 400 mg of sodium is changed to reduced sodium, it will have 300 mg of sodium. WHAT FOODS CAN I EAT? Grains Low-sodium cereals, including oats, puffed wheat and rice, and shredded wheat cereals. Low-sodium crackers. Unsalted rice and pasta. Lower-sodium bread.  Vegetables Frozen or fresh vegetables. Low-sodium or reduced-sodium canned vegetables. Low-sodium or reduced-sodium tomato sauce and paste. Low-sodium or reduced-sodium tomato and vegetable juices.  Fruits Fresh, frozen, and canned fruit. Fruit juice.  Meat and Other Protein Products Low-sodium canned tuna and salmon. Fresh or frozen meat, poultry, seafood, and fish.  Lamb. Unsalted nuts. Dried beans, peas, and lentils without added salt. Unsalted canned beans. Homemade soups without salt. Eggs.  Dairy Milk. Soy milk. Ricotta cheese. Low-sodium or reduced-sodium cheeses. Yogurt.  Condiments Fresh and dried herbs and spices. Salt-free seasonings. Onion and garlic powders. Low-sodium varieties of mustard and ketchup. Fresh or refrigerated horseradish. Lemon juice.  Fats and Oils Reduced-sodium salad dressings. Unsalted butter.  Other Unsalted popcorn and pretzels.  The items listed above may not be a complete list of recommended foods or beverages. Contact your dietitian for more options. WHAT FOODS ARE NOT RECOMMENDED? Grains Instant hot cereals. Bread stuffing, pancake, and biscuit mixes. Croutons. Seasoned rice or pasta mixes. Noodle soup cups. Boxed or frozen macaroni and cheese. Self-rising flour. Regular salted crackers. Vegetables Regular canned vegetables. Regular canned tomato sauce and paste. Regular tomato and vegetable juices. Frozen vegetables in sauces. Salted [place] fries. Olives. Angie Fava. Relishes. Sauerkraut. Salsa. Meat and Other Protein Products Salted, canned, smoked, spiced, or pickled meats, seafood, or fish.  Bacon, ham, sausage, hot dogs, corned beef, chipped beef, and packaged luncheon meats. Salt pork. Jerky. Pickled herring. Anchovies, regular canned tuna, and sardines. Salted nuts. Dairy Processed cheese and cheese spreads. Cheese curds. Blue cheese and cottage cheese. Buttermilk.  Condiments Onion and garlic salt, seasoned salt, table salt, and sea salt. Canned and packaged gravies. Worcestershire sauce. Tartar sauce. Barbecue sauce. Teriyaki sauce. Soy sauce, including reduced sodium. Steak sauce. Fish sauce. Oyster sauce. Cocktail sauce. Horseradish that you find on the shelf. Regular ketchup and mustard. Meat flavorings and tenderizers. Bouillon cubes. Hot sauce. Tabasco sauce. Marinades. Taco seasonings. Relishes. Fats  and Oils Regular salad dressings. Salted butter. Margarine. Ghee. Bacon fat.  Other Potato and tortilla chips. Corn chips and puffs. Salted popcorn and pretzels. Canned or dried soups. Pizza. Frozen entrees and pot pies.  The items listed above may not be a complete list of foods and beverages to avoid. Contact your dietitian for more information.   This information is not intended to replace advice given to you by your health care provider. Make sure you discuss any questions you have with your health care provider.   Document Released: 08/12/2001 Document Revised: 03/13/2014 Document Reviewed: 12/25/2012 Elsevier Interactive Patient Education [employer].

## 2015-03-11 NOTE — Progress Notes (Signed)
03/11/2015 Steve Baker   [birth year]  [id number]  Primary Physician Leonard Downing, MD Primary Cardiologist: Dr. Radford Pax   Reason for Visit/CC:  Chronic Systolic CHF; Medication Monitoring   HPI:  The patient is a 75 y/o male, followed by Dr. Radford Pax, who presents to clinic today for f/u reagading chronic systolic CHF and for medicaiton monintoring. 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 Fletcher Anon, prostate CA (2001), hyperlipidemia and stage IV non-small cell lung CA. 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 He was seen by Dr. Fletcher Anon due to severe claudication and subsequently underwent left profundus femoral endarterectomy and patch angioplasty and left femoral to above the knee popliteal bypass by Dr. Donnetta Hutching.  He was seen by Dr. Radford Pax 2 weeks ago on 02/18/15 and she placed him on Coreg 3.125 mg BID and Aldactone 25 mg daily for systolic CHF. He was continued on 20 mg of lisinopril daily. She instructed for him to return in 2 weeks for f/u BMP and for office visit.  Today in clinic, he reports that he has done well since his last OV. He has tolerated the medications well w/o side effects. BP today is 142/82. HR is 60 bpm. BMP was obtained earlier today. K is WNL at 4.6 and renal function is also normal with SCr at 1.2. He denies any dyspnea, orthopnea, PND or LEE. He also denies CP.     Current Outpatient Prescriptions  Medication Sig Dispense Refill  . aspirin EC 81 MG tablet Take 81 mg by mouth daily.    . carvedilol (COREG) 3.125 MG tablet Take 1 tablet (3.125 mg total) by mouth 2 (two) times daily with a meal. 60 tablet 11  . FeFum-FePoly-FA-B Cmp-C-Biot (INTEGRA PLUS) CAPS Take 1 capsule by mouth every  morning. 30 capsule 2  . folic acid (FOLVITE) 1 MG tablet Take 1 tablet (1 mg total) by mouth daily. 30 tablet 4  . lisinopril (PRINIVIL,ZESTRIL) 20 MG tablet Take 20 mg by mouth daily.    . pravastatin (PRAVACHOL) 80 MG tablet Take 1 tablet (80 mg total) by mouth every evening. 90 tablet 3  . spironolactone (ALDACTONE) 25 MG tablet Take 1 tablet (25 mg total) by mouth daily. 30 tablet 11  . Tiotropium Bromide Monohydrate (SPIRIVA RESPIMAT) 2.5 MCG/ACT AERS Inhale 2 puffs into the lungs daily. 1 Inhaler 4   No current facility-administered medications for this visit.    No Known Allergies  Social History   Social History  . Marital Status: Married    Spouse Name: nancy  . Number of Children: 2  . Years of Education: college   Occupational History  . retired    Social History Main Topics  . Smoking status: Current Every Day Smoker -- 1.50 packs/day for 57 years    Types: Cigarettes  . Smokeless tobacco: Never Used  . Alcohol Use: 1.8 oz/week    3 Cans of beer, 0 Standard drinks or equivalent per week     Comment: daily  . Drug Use: No  . Sexual Activity: Not Currently   Other Topics Concern  . Not on file   Social History Narrative     Review of Systems: General: negative for chills,  fever, night sweats or weight changes.  Cardiovascular: negative for chest pain, dyspnea on exertion, edema, orthopnea, palpitations, paroxysmal nocturnal dyspnea or shortness of breath Dermatological: negative for rash Respiratory: negative for cough or wheezing Urologic: negative for hematuria Abdominal: negative for nausea, vomiting, diarrhea, bright red blood per rectum, melena, or hematemesis Neurologic: negative for visual changes, syncope, or dizziness All other systems reviewed and are otherwise negative except as noted above.    Blood pressure 142/82, pulse 60, height '5\' 8"'$  (1.727 m), weight 198 lb 6.4 oz (89.994 kg).  General appearance: alert, cooperative and no  distress Neck: no carotid bruit and no JVD Lungs: clear to auscultation bilaterally Heart: regular rate and rhythm, S1, S2 normal, no murmur, click, rub or gallop Extremities: no LEE Pulses: 2+ and symmetric Skin: warm and dry Neurologic: Grossly normal  EKG not performed  ASSESSMENT AND PLAN:   1. Chronic Systolic CHF: EF 26% at time of last assessment. Plan is for medical therapy followed by repeat 2D echo in 2 months. He has tolerated the initiation of Coreg and Aldactone well w/o side effects. BP, HR, potatssium level and renal function are all stable. Given a resting HR of 60 bpm, we will not increase his Coreg at this time. Continue at 3.125 mg BID. His BP is 142/82. We will increase his lisinopril to 40 mg daily and will check a f/u BMP in 10 days. Patient was instructed to continue Aldactone 25 mg daily and to notify our office if any symptoms of low BP. He was also advised to check weight daily and maintain a low sodium diet. F/u in 2 weeks with APP for further medication titration. Will attempt to increase Coreg at next office visit, if HR and BP allows.   2. CAD: s/p CABG in 2001 with 2/4 patent grafts on cath 06/2014. He is stable w/o CP. Continue medical therapy.    PLAN  Continue guideline directed medical therapy for systolic CHF as outlined above. F/u in 2 weeks with APP for further medication titration and repeat BMP. Will attempt to increase Coreg at next office visit, if HR and BP allows.    Lyda Jester PA-C 03/11/2015 10:36 AM

## 2015-03-12 ENCOUNTER — Encounter: Payer: Self-pay | Admitting: Vascular Surgery

## 2015-03-12 ENCOUNTER — Ambulatory Visit (HOSPITAL_COMMUNITY)
Admission: RE | Admit: 2015-03-12 | Discharge: 2015-03-12 | Disposition: A | Discharge status: Home / Self Care | Payer: Medicare Other | Source: Ambulatory Visit | Attending: Internal Medicine | Admitting: Internal Medicine

## 2015-03-12 DIAGNOSIS — I2510 Atherosclerotic heart disease of native coronary artery without angina pectoris: Secondary | ICD-10-CM | POA: Diagnosis not present

## 2015-03-12 DIAGNOSIS — Z951 Presence of aortocoronary bypass graft: Secondary | ICD-10-CM | POA: Insufficient documentation

## 2015-03-12 DIAGNOSIS — I517 Cardiomegaly: Secondary | ICD-10-CM | POA: Diagnosis not present

## 2015-03-12 DIAGNOSIS — Z9221 Personal history of antineoplastic chemotherapy: Secondary | ICD-10-CM | POA: Insufficient documentation

## 2015-03-12 DIAGNOSIS — K5730 Diverticulosis of large intestine without perforation or abscess without bleeding: Secondary | ICD-10-CM | POA: Diagnosis not present

## 2015-03-12 DIAGNOSIS — C3491 Malignant neoplasm of unspecified part of right bronchus or lung: Secondary | ICD-10-CM | POA: Diagnosis present

## 2015-03-12 MED ORDER — IOHEXOL 300 MG/ML  SOLN
100.0000 mL | Freq: Once | INTRAMUSCULAR | Status: AC | PRN
  Administered 2015-03-12: 100 mL via INTRAVENOUS

## 2015-03-15 ENCOUNTER — Encounter: Payer: Self-pay | Admitting: Internal Medicine

## 2015-03-15 ENCOUNTER — Ambulatory Visit (HOSPITAL_BASED_OUTPATIENT_CLINIC_OR_DEPARTMENT_OTHER): Payer: Medicare Other | Admitting: Internal Medicine

## 2015-03-15 VITALS — BP 131/63 | HR 62 | Resp 18 | Ht 68.0 in | Wt 199.7 lb

## 2015-03-15 DIAGNOSIS — C3491 Malignant neoplasm of unspecified part of right bronchus or lung: Secondary | ICD-10-CM

## 2015-03-15 DIAGNOSIS — C3411 Malignant neoplasm of upper lobe, right bronchus or lung: Secondary | ICD-10-CM

## 2015-03-15 DIAGNOSIS — Z720 Tobacco use: Secondary | ICD-10-CM | POA: Diagnosis not present

## 2015-03-15 NOTE — Progress Notes (Signed)
[place] [phone]   [phone]  OFFICE PROGRESS NOTE  Leonard Downing, MD [address] [place] [address]   DIAGNOSIS: Adenocarcinoma of right lung  Staging form: Lung, AJCC 7th Edition  Clinical stage from 08/13/2014: Stage IV (T2a, N2, M1a) - Signed by Curt Bears, MD on 08/15/2014  PRIOR THERAPY: Systemic chemotherapy carboplatin for AUC of 5 and Alimta at 500 mg/m given every 3 weeks. Status post 6 cycles.  CURRENT THERAPY: Observation.  INTERVAL HISTORY: Steve Baker 75 y.o. male returns to the clinic today for follow-up visit accompanied by his wife. The patient completed 6 cycles of systemic chemotherapy with carboplatin and Alimta and tolerating his treatment fairly well with no significant adverse effects. He has been on observation for the last few months and feeling much better. He denied having any significant nausea or vomiting. He has no fever or chills. He denied having any significant chest pain, shortness breath, cough or hemoptysis. The patient denied having any significant weight loss or night sweats. He had repeat CT scan of the chest, abdomen and pelvis performed recently and he is here for evaluation and discussion of his scan results.  MEDICAL HISTORY: Past Medical History  Diagnosis Date  . Hyperlipidemia   . Irregular heart beat     Pt stated his heart skips a beat every now and then  . Shortness of breath dyspnea     with ambulation  . Peripheral vascular disease ([place])     s/p aortobifemoral bypass and left profundus femoral endarterectomy with patch angioplasty and left femoral to above the knee popliteal bypass  . Anxiety   . Hypertension     lisinopril  . Dysrhythmia   . COPD (chronic obstructive pulmonary disease) ([place])   . History of bronchitis   . Anemia   . Prostate cancer ([place]) 2001    seeds  . 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 Lung cancer ([place])     adenocarcinoma of  right lung  . Full code status 12/23/2014  . Hyperlipidemia LDL goal <70 02/18/2015  . DCM (dilated cardiomyopathy) ([place]) 02/18/2015  . Coronary artery disease     s/p CABG now with 2/4 grafts patent by cath 2016 - medical management    ALLERGIES:  has No Known Allergies.  MEDICATIONS:  Current Outpatient Prescriptions  Medication Sig Dispense Refill  . aspirin EC 81 MG tablet Take 81 mg by mouth daily.    . carvedilol (COREG) 3.125 MG tablet Take 1 tablet (3.125 mg total) by mouth 2 (two) times daily with a meal. 60 tablet 11  . lisinopril (PRINIVIL,ZESTRIL) 40 MG tablet Take 1 tablet (40 mg total) by mouth daily. 90 tablet 3  . pravastatin (PRAVACHOL) 80 MG tablet Take 1 tablet (80 mg total) by mouth every evening. 90 tablet 3  . spironolactone (ALDACTONE) 25 MG tablet Take 1 tablet (25 mg total) by mouth daily. 30 tablet 11  . Tiotropium Bromide Monohydrate (SPIRIVA RESPIMAT) 2.5 MCG/ACT AERS Inhale 2 puffs into the lungs daily. 1 Inhaler 4   No current facility-administered medications for this visit.    SURGICAL HISTORY:  Past Surgical History  Procedure Laterality Date  . Cholecystectomy    . Heart bypass    . Cardiac catheterization  2001  . Skin cancer excision N/A     Nose  . Abdominal aortic aneurysm repair    . Eye surgery Bilateral     cataract  removal  . Colonoscopy w/ polypectomy    . Cardiac catheterization N/A 07/16/2014    Procedure: Right/Left Heart Cath and Coronary Angiography;  Surgeon: Burnell Blanks, MD;  Location: [facility];  Service: Cardiovascular;  Laterality: N/A;  . Coronary artery bypass graft    . Video bronchoscopy with endobronchial navigation N/A 07/29/2014    Procedure: VIDEO BRONCHOSCOPY WITH ENDOBRONCHIAL NAVIGATION;  Surgeon: Collene Gobble, MD;  Location: [facility];  Service: Thoracic;  Laterality: N/A;  . Endarterectomy femoral Left 08/19/2014    Procedure: PROFUNDA FEMORIS ENDARTERECTOMY ;  Surgeon: Rosetta Posner, MD;   Location: [facility];  Service: Vascular;  Laterality: Left;  . Femoral-popliteal bypass graft Left 08/19/2014    Procedure: FEMORAL-POPLITEAL ARTERY BYPASS GRAFT WITH PROPATEN GORTEX ;  Surgeon: Rosetta Posner, MD;  Location: [facility];  Service: Vascular;  Laterality: Left;  . Patch angioplasty Left 08/19/2014    Procedure: LEFT PROFUNDA FEMORIS PATCH ANGIOPLASTY USING HEMASHIELD PLATINUM FINESSE PATCH;  Surgeon: Rosetta Posner, MD;  Location: [facility];  Service: Vascular;  Laterality: Left;  . Peripheral vascular catheterization N/A 08/05/2014    Procedure: Abdominal Aortogram w/Lower Extremity;  Surgeon: Wellington Hampshire, MD;  Location: [facility];  Service: Cardiovascular;  Laterality: N/A;    REVIEW OF SYSTEMS:  A comprehensive review of systems was negative.   PHYSICAL EXAMINATION: General appearance: alert, cooperative and no distress Head: Normocephalic, without obvious abnormality, atraumatic Neck: no adenopathy, no JVD, supple, symmetrical, trachea midline and thyroid not enlarged, symmetric, no tenderness/mass/nodules Lymph nodes: Cervical, supraclavicular, and axillary nodes normal. Resp: clear to auscultation bilaterally Back: symmetric, no curvature. ROM normal. No CVA tenderness. Cardio: regular rate and rhythm, S1, S2 normal, no murmur, click, rub or gallop GI: soft, non-tender; bowel sounds normal; no masses,  no organomegaly Extremities: extremities normal, atraumatic, no cyanosis or edema Neurologic: Alert and oriented X 3, normal strength and tone. Normal symmetric reflexes. Normal coordination and gait  ECOG PERFORMANCE STATUS: 1 - Symptomatic but completely ambulatory  Blood pressure 131/63, pulse 62, resp. rate 18, height '5\' 8"'$  (1.727 m), weight 199 lb 11.2 oz (90.583 kg), SpO2 100 %.  LABORATORY DATA: Lab Results  Component Value Date   WBC 4.3 03/11/2015   HGB 12.5* 03/11/2015   HCT 38.7 03/11/2015   MCV 100.8* 03/11/2015   PLT 143 03/11/2015      Chemistry        Component Value Date/Time   NA 137 03/11/2015 0918   NA 137 02/25/2015 1030   K 4.6 03/11/2015 0918   K 4.7 02/25/2015 1030   CL 103 02/25/2015 1030   CO2 26 03/11/2015 0918   CO2 26 02/25/2015 1030   BUN 16.5 03/11/2015 0918   BUN 14 02/25/2015 1030   CREATININE 1.2 03/11/2015 0918   CREATININE 1.36* 02/25/2015 1030   CREATININE 0.96 08/21/2014 0312      Component Value Date/Time   CALCIUM 9.2 03/11/2015 0918   CALCIUM 8.9 02/25/2015 1030   ALKPHOS 78 03/11/2015 0918   ALKPHOS 69 02/18/2015 1135   AST 39* 03/11/2015 0918   AST 38* 02/18/2015 1135   ALT 12 03/11/2015 0918   ALT 8* 02/18/2015 1135   BILITOT 0.34 03/11/2015 0918   BILITOT 0.4 02/18/2015 1135       RADIOGRAPHIC STUDIES: Ct Chest W Contrast  03/12/2015  CLINICAL DATA:  75 year old male with history of right-sided lung cancer diagnosed in February 2016 status post chemotherapy (completed in October 2016). Followup  study. EXAM: CT CHEST, ABDOMEN, AND PELVIS WITH CONTRAST TECHNIQUE: Multidetector CT imaging of the chest, abdomen and pelvis was performed following the standard protocol during bolus administration of intravenous contrast. CONTRAST:  114m OMNIPAQUE IOHEXOL 300 MG/ML  SOLN COMPARISON:  CT of the chest, abdomen and pelvis 01/11/2015. FINDINGS: CT CHEST FINDINGS Mediastinum/Lymph Nodes: Heart size is enlarged with mild left ventricular dilatation (left ventricle measures up to 58 mm in diameter on today's nongated study). There is no significant pericardial fluid, thickening or pericardial calcification. There is atherosclerosis of the thoracic aorta, the great vessels of the mediastinum and the coronary arteries, including calcified atherosclerotic plaque in the left main, left anterior descending, left circumflex and right coronary arteries. Status post median sternotomy for CABG, including LIMA to the LAD. No pathologically enlarged mediastinal or hilar lymph nodes. Esophagus is unremarkable in appearance. No  axillary lymphadenopathy. Lungs/Pleura: Interval enlargement of the previously noted right upper lobe pulmonary nodule, which is currently a 2.6 x 3.1 x 3.2 cm macrolobulated mass with spiculated margins extending to the overlying pleura in the apex of the right upper lobe (image 12 of series 5, and image 54 of coronal series 602). Previously noted linear density in the left lower lobe is similar in appearance on image 41 of series 5, measuring approximately 15 x 4 mm on today's study. No other new suspicious appearing pulmonary nodules or masses are noted. Small fiducial markers noted in the left lower lobe. No acute consolidative airspace disease. No pleural effusions. Musculoskeletal/Soft Tissues: Median sternotomy wires. There are no aggressive appearing lytic or blastic lesions noted in the visualized portions of the skeleton. CT ABDOMEN AND PELVIS FINDINGS Hepatobiliary: No cystic or solid hepatic lesions. No intra or extrahepatic biliary ductal dilatation. Status post cholecystectomy. Pancreas: No pancreatic mass. No pancreatic ductal dilatation. No pancreatic or peripancreatic fluid or inflammatory changes. Spleen: Unremarkable. Adrenals/Urinary Tract: Bilateral adrenal glands are normal in appearance. Mild diffuse cortical thinning in the kidneys bilaterally. No suspicious renal lesions. No hydroureteronephrosis. Urinary bladder is normal in appearance. Stomach/Bowel: Normal appearance of the stomach. No pathologic dilatation of small bowel or colon. A few scattered colonic diverticulae are noted, without surrounding inflammatory changes to suggest an acute diverticulitis at this time. Vascular/Lymphatic: Extensive atherosclerosis throughout the abdominal and pelvic vasculature, without evidence of aneurysm or dissection. Status post aortobifemoral bypass graft placement, which appears widely patent. Native superficial femoral arteries appear occluded bilaterally, as do the native common femoral arteries  bilaterally. No lymphadenopathy noted in the abdomen or pelvis. Reproductive: Brachytherapy seeds throughout the prostate gland. Calcification in the seminal vesicles bilaterally. Other: No significant volume of ascites.  No pneumoperitoneum. Musculoskeletal: There are no aggressive appearing lytic or blastic lesions noted in the visualized portions of the skeleton. IMPRESSION: 1. Today's study demonstrates slight progression of disease, with enlarging right upper lobe pulmonary mass, which currently measures 2.6 x 3.1 x 3.2 cm (previously 2.3 x 2.9 cm on prior study 01/11/2015). 2. Other area of linear scarring at site of treated left lower lobe pulmonary nodule is similar in appearance to the prior study. 3. No new nodules or findings of metastatic disease in the abdomen or pelvis. 4. Colonic diverticulosis without evidence of acute diverticulitis at this time. 5. Atherosclerosis, including left main and 3 vessel coronary artery disease. Status post median sternotomy for CABG, including LIMA to the LAD. Patient is also status post aortobifemoral bypass graft. 6. Cardiomegaly with mild left atrial dilatation cardiomegaly with mild left ventricular dilatation. Electronically Signed  By: Vinnie Langton M.D.   On: 03/12/2015 13:30   Ct Abdomen Pelvis W Contrast  03/12/2015  CLINICAL DATA:  75 year old male with history of right-sided lung cancer diagnosed in February 2016 status post chemotherapy (completed in October 2016). Followup study. EXAM: CT CHEST, ABDOMEN, AND PELVIS WITH CONTRAST TECHNIQUE: Multidetector CT imaging of the chest, abdomen and pelvis was performed following the standard protocol during bolus administration of intravenous contrast. CONTRAST:  14m OMNIPAQUE IOHEXOL 300 MG/ML  SOLN COMPARISON:  CT of the chest, abdomen and pelvis 01/11/2015. FINDINGS: CT CHEST FINDINGS Mediastinum/Lymph Nodes: Heart size is enlarged with mild left ventricular dilatation (left ventricle measures up to 58 mm  in diameter on today's nongated study). There is no significant pericardial fluid, thickening or pericardial calcification. There is atherosclerosis of the thoracic aorta, the great vessels of the mediastinum and the coronary arteries, including calcified atherosclerotic plaque in the left main, left anterior descending, left circumflex and right coronary arteries. Status post median sternotomy for CABG, including LIMA to the LAD. No pathologically enlarged mediastinal or hilar lymph nodes. Esophagus is unremarkable in appearance. No axillary lymphadenopathy. Lungs/Pleura: Interval enlargement of the previously noted right upper lobe pulmonary nodule, which is currently a 2.6 x 3.1 x 3.2 cm macrolobulated mass with spiculated margins extending to the overlying pleura in the apex of the right upper lobe (image 12 of series 5, and image 54 of coronal series 602). Previously noted linear density in the left lower lobe is similar in appearance on image 41 of series 5, measuring approximately 15 x 4 mm on today's study. No other new suspicious appearing pulmonary nodules or masses are noted. Small fiducial markers noted in the left lower lobe. No acute consolidative airspace disease. No pleural effusions. Musculoskeletal/Soft Tissues: Median sternotomy wires. There are no aggressive appearing lytic or blastic lesions noted in the visualized portions of the skeleton. CT ABDOMEN AND PELVIS FINDINGS Hepatobiliary: No cystic or solid hepatic lesions. No intra or extrahepatic biliary ductal dilatation. Status post cholecystectomy. Pancreas: No pancreatic mass. No pancreatic ductal dilatation. No pancreatic or peripancreatic fluid or inflammatory changes. Spleen: Unremarkable. Adrenals/Urinary Tract: Bilateral adrenal glands are normal in appearance. Mild diffuse cortical thinning in the kidneys bilaterally. No suspicious renal lesions. No hydroureteronephrosis. Urinary bladder is normal in appearance. Stomach/Bowel: Normal  appearance of the stomach. No pathologic dilatation of small bowel or colon. A few scattered colonic diverticulae are noted, without surrounding inflammatory changes to suggest an acute diverticulitis at this time. Vascular/Lymphatic: Extensive atherosclerosis throughout the abdominal and pelvic vasculature, without evidence of aneurysm or dissection. Status post aortobifemoral bypass graft placement, which appears widely patent. Native superficial femoral arteries appear occluded bilaterally, as do the native common femoral arteries bilaterally. No lymphadenopathy noted in the abdomen or pelvis. Reproductive: Brachytherapy seeds throughout the prostate gland. Calcification in the seminal vesicles bilaterally. Other: No significant volume of ascites.  No pneumoperitoneum. Musculoskeletal: There are no aggressive appearing lytic or blastic lesions noted in the visualized portions of the skeleton. IMPRESSION: 1. Today's study demonstrates slight progression of disease, with enlarging right upper lobe pulmonary mass, which currently measures 2.6 x 3.1 x 3.2 cm (previously 2.3 x 2.9 cm on prior study 01/11/2015). 2. Other area of linear scarring at site of treated left lower lobe pulmonary nodule is similar in appearance to the prior study. 3. No new nodules or findings of metastatic disease in the abdomen or pelvis. 4. Colonic diverticulosis without evidence of acute diverticulitis at this time. 5. Atherosclerosis, including left  main and 3 vessel coronary artery disease. Status post median sternotomy for CABG, including LIMA to the LAD. Patient is also status post aortobifemoral bypass graft. 6. Cardiomegaly with mild left atrial dilatation cardiomegaly with mild left ventricular dilatation. Electronically Signed   By: Vinnie Langton M.D.   On: 03/12/2015 13:30    ASSESSMENT AND PLAN: This is a very pleasant 75 years old white male with a stage IV non-small cell lung cancer status post 6 cycles of systemic  chemotherapy with carboplatin and Alimta. The patient tolerated his current treatment fairly well with no significant adverse effect except for mild fatigue. The patient has been observation and feeling fine with no specific complaints. His recent CT scan of the chest showed mild disease progression and the right upper lobe lung mass. I discussed the scan results and showed the images to the patient and his wife. I gave him the option of continuous observation and close monitoring versus referral to Dr. Sondra Come for consideration of palliative radiotherapy to the enlarging right upper lobe lung mass. The patient is interested in proceeding with radiotherapy. I will refer him to see Dr. Sondra Come. I would see him back for follow-up visit in 3 months for reevaluation with repeat CT scan of the chest. For smoke cessation, I strongly encouraged the patient to quit smoking and offered him a smoke cessation program. CODE STATUS: he is full code for now. The patient was advised to call immediately if he has any concerning symptoms in the interval. The patient voices understanding of current disease status and treatment options and is in agreement with the current care plan.  All questions were answered. The patient knows to call the clinic with any problems, questions or concerns. We can certainly see the patient much sooner if necessary.  Disclaimer: This note was dictated with voice recognition software. Similar sounding words can inadvertently be transcribed and may not be corrected upon review.

## 2015-03-16 ENCOUNTER — Ambulatory Visit (INDEPENDENT_AMBULATORY_CARE_PROVIDER_SITE_OTHER)
Admission: RE | Admit: 2015-03-16 | Discharge: 2015-03-16 | Disposition: A | Discharge status: Home / Self Care | Payer: Medicare Other | Source: Ambulatory Visit | Attending: Vascular Surgery | Admitting: Vascular Surgery

## 2015-03-16 ENCOUNTER — Ambulatory Visit (HOSPITAL_COMMUNITY)
Admission: RE | Admit: 2015-03-16 | Discharge: 2015-03-16 | Disposition: A | Discharge status: Home / Self Care | Payer: Medicare Other | Source: Ambulatory Visit | Attending: Vascular Surgery | Admitting: Vascular Surgery

## 2015-03-16 ENCOUNTER — Ambulatory Visit (INDEPENDENT_AMBULATORY_CARE_PROVIDER_SITE_OTHER): Payer: Medicare Other | Admitting: Vascular Surgery

## 2015-03-16 ENCOUNTER — Encounter: Payer: Self-pay | Admitting: Vascular Surgery

## 2015-03-16 VITALS — BP 121/72 | HR 68 | Ht 68.0 in | Wt 201.0 lb

## 2015-03-16 DIAGNOSIS — I739 Peripheral vascular disease, unspecified: Secondary | ICD-10-CM

## 2015-03-16 DIAGNOSIS — R938 Abnormal findings on diagnostic imaging of other specified body structures: Secondary | ICD-10-CM | POA: Insufficient documentation

## 2015-03-16 DIAGNOSIS — E785 Hyperlipidemia, unspecified: Secondary | ICD-10-CM | POA: Insufficient documentation

## 2015-03-16 DIAGNOSIS — I10 Essential (primary) hypertension: Secondary | ICD-10-CM | POA: Diagnosis not present

## 2015-03-16 DIAGNOSIS — Z48812 Encounter for surgical aftercare following surgery on the circulatory system: Secondary | ICD-10-CM

## 2015-03-16 NOTE — Progress Notes (Signed)
[facility]  Patient name: Steve Baker MRN: [id number] DOB: Feb 28, 1941 Sex: male  REASON FOR VISIT:  Follow-up left femoropopliteal bypass  HPI: Steve Baker is a 74 y.o. male  2% was liquid critical limb ischemia in June 2016. Also with a recent diagnosis of metastatic lung cancer. He underwent urgent left performed endarterectomy and left femoral to above-knee popliteal bypass with prosthetic graft. He had prior vein harvest on that side. He looks quite good today. He reports that he is being treated with both chemotherapy and radiation therapy. He reports no claudication type symptoms.  Past Medical History  Diagnosis Date  . Hyperlipidemia   . Irregular heart beat     Pt stated his heart skips a beat every now and then  . Shortness of breath dyspnea     with ambulation  . Peripheral vascular disease ([place])     s/p aortobifemoral bypass and left profundus femoral endarterectomy with patch angioplasty and left femoral to above the knee popliteal bypass  . Anxiety   . Hypertension     lisinopril  . Dysrhythmia   . COPD (chronic obstructive pulmonary disease) ([place])   . History of bronchitis   . Anemia   . Prostate cancer ([place]) 2001    seeds  . 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 Lung cancer ([place])     adenocarcinoma of right lung  . Full code status 12/23/2014  . Hyperlipidemia LDL goal <70 02/18/2015  . DCM (dilated cardiomyopathy) ([place]) 02/18/2015  . Coronary artery disease     s/p CABG now with 2/4 grafts patent by cath 2016 - medical management    Family History  Problem Relation Age of Onset  . Heart Problems Mother   . Heart attack Father   . Stroke Neg Hx     SOCIAL HISTORY: Social History  Substance Use Topics  . Smoking status: Current Every Day Smoker -- 2.00 packs/day for 57 years    Types: Cigarettes  . Smokeless tobacco: Never Used  . Alcohol Use: 1.8 oz/week    3 Cans of beer, 0 Standard drinks or equivalent per week       Comment: daily    No Known Allergies  Current Outpatient Prescriptions  Medication Sig Dispense Refill  . aspirin EC 81 MG tablet Take 81 mg by mouth daily.    . carvedilol (COREG) 3.125 MG tablet Take 1 tablet (3.125 mg total) by mouth 2 (two) times daily with a meal. 60 tablet 11  . lisinopril (PRINIVIL,ZESTRIL) 40 MG tablet Take 1 tablet (40 mg total) by mouth daily. 90 tablet 3  . pravastatin (PRAVACHOL) 80 MG tablet Take 1 tablet (80 mg total) by mouth every evening. 90 tablet 3  . spironolactone (ALDACTONE) 25 MG tablet Take 1 tablet (25 mg total) by mouth daily. 30 tablet 11  . Tiotropium Bromide Monohydrate (SPIRIVA RESPIMAT) 2.5 MCG/ACT AERS Inhale 2 puffs into the lungs daily. 1 Inhaler 4   No current facility-administered medications for this visit.    REVIEW OF SYSTEMS:  '[X]'$  denotes positive finding, '[ ]'$  denotes negative finding Cardiac  Comments:  Chest pain or chest pressure:    Shortness of breath upon exertion: x   Short of breath when lying flat:    Irregular heart rhythm:        Vascular    Pain in calf, thigh, or hip brought on by ambulation:    Pain in feet at night that  wakes you up from your sleep:     Blood clot in your veins:    Leg swelling:         Pulmonary    Oxygen at home:    Productive cough:     Wheezing:         Neurologic    Sudden weakness in arms or legs:     Sudden numbness in arms or legs:     Sudden onset of difficulty speaking or slurred speech:    Temporary loss of vision in one eye:     Problems with dizziness:         Gastrointestinal    Blood in stool:     Vomited blood:         Genitourinary    Burning when urinating:     Blood in urine:        Psychiatric    Major depression:         Hematologic    Bleeding problems:    Problems with blood clotting too easily:        Skin    Rashes or ulcers:        Constitutional    Fever or chills:      PHYSICAL EXAM: Filed Vitals:   03/16/15 1525  BP: 121/72   Pulse: 68  Height: '5\' 8"'$  (1.727 m)  Weight: 201 lb (91.173 kg)  SpO2: 99%    GENERAL: The patient is a well-nourished male, in no acute distress. The vital signs are documented above. CARDIAC: There is a regular rate and rhythm.  VASCULAR:  Palpable left femoral and palpable posterior tibial pulse on the left PULMONARY: There is good air exchange  MUSCULOSKELETAL: There are no major deformities or cyanosis. NEUROLOGIC: No focal weakness or paresthesias are detected. SKIN: There are no ulcers or rashes noted. PSYCHIATRIC: The patient has a normal affect.  DATA:   noninvasive vascular lab studies today revealed normal ankle arm index at 1.0. Preoperative study was critical at 0.1. His duplex shows good flow throughout his bypass with biphasic and triphasic flow and no elevated velocities  MEDICAL ISSUES:  stable status post urgent femoropopliteal bypass for critical limb ischemia. He continues to do well. He will continue his walking program. We will see him again in 6 months with repeat noninvasive studies  No Follow-up on file.   Curt Jews [facility] [place]: [phone]

## 2015-03-17 ENCOUNTER — Other Ambulatory Visit: Payer: Self-pay

## 2015-03-17 ENCOUNTER — Telehealth: Payer: Self-pay | Admitting: Internal Medicine

## 2015-03-17 NOTE — Telephone Encounter (Signed)
Left message for patient confirm April appointments and mailed schedule. [facility] will call re ct - patient already on schedule for radonc.

## 2015-03-17 NOTE — Addendum Note (Signed)
Addended by: Dorthula Rue L on: 03/17/2015 09:20 AM   Modules accepted: Orders

## 2015-03-24 NOTE — Progress Notes (Signed)
Cardiology Office Note:    Date:  03/25/2015   ID:  [name], DOB February 06, 1941, MRN [id number]  PCP:  Leonard Downing, MD  Cardiologist:  Dr. Fransico Him   Electrophysiologist:  n/a  Chief Complaint  Patient presents with  . Congestive Heart Failure    Follow-up    History of Present Illness:    Steve Baker is a 75 y.o. male with a hx of CAD s/p CABG in 2001, ischemic cardiomyopathy, AAA s/p repair, HTN, Prostate CA, HL, tobacco abuse, PAD, stage IV non-small cell lung CA. LHC done in 5/16 demonstrated 3 vessel CAD with 2/4 grafts patent. Medical therapy was recommended. The patient has been treated with chemotherapy with carboplatin and Alimta. He is being referred for radiotherapy as well. He developed left leg claudication and saw Dr. Fletcher Anon and subsequently underwent left profundus femoral endarterectomy and Dacron patch angioplasty and left femoral to above-knee popliteal bypass in 6/16 with Dr. Donnetta Hutching. Echo in 5/16 demonstrated EF 40-45%. At cardiac catheterization in 5/16, EF was 25%.  Patient was seen by Dr. Radford Pax 12/16. Beta blocker and spironolactone were added to his medical regimen.  He saw Lyda Jester, PA-C 03/11/15.  Lisinopril dose was adjusted. He returns for further medication titration.  Here today with his wife. He is overall doing well. Denies chest pain. He denies significant dyspnea. He denies syncope. He does get lightheaded at times and loses balance. He denies that may, PND or significant pedal edema.     Past Medical History  Diagnosis Date  . Hyperlipidemia   . Irregular heart beat     Pt stated his heart skips a beat every now and then  . Shortness of breath dyspnea     with ambulation  . Peripheral vascular disease ([place])     s/p aortobifemoral bypass and left profundus femoral endarterectomy with patch angioplasty and left femoral to above the knee popliteal bypass  . Anxiety   . Hypertension     lisinopril  . Dysrhythmia   . COPD (chronic  obstructive pulmonary disease) ([place])   . History of bronchitis   . Anemia   . Prostate cancer ([place]) 2001    seeds  . 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 Lung cancer ([place])     adenocarcinoma of right lung  . Full code status 12/23/2014  . Hyperlipidemia LDL goal <70 02/18/2015  . DCM (dilated cardiomyopathy) ([place]) 02/18/2015  . Coronary artery disease     s/p CABG now with 2/4 grafts patent by cath 2016 - medical management    Past Surgical History  Procedure Laterality Date  . Cholecystectomy    . Heart bypass    . Cardiac catheterization  2001  . Skin cancer excision N/A     Nose  . Abdominal aortic aneurysm repair    . Eye surgery Bilateral     cataract removal  . Colonoscopy w/ polypectomy    . Cardiac catheterization N/A 07/16/2014    Procedure: Right/Left Heart Cath and Coronary Angiography;  Surgeon: Burnell Blanks, MD;  Location: [facility];  Service: Cardiovascular;  Laterality: N/A;  . Coronary artery bypass graft    . Video bronchoscopy with endobronchial navigation N/A 07/29/2014    Procedure: VIDEO BRONCHOSCOPY WITH ENDOBRONCHIAL NAVIGATION;  Surgeon: Collene Gobble, MD;  Location: [facility];  Service: Thoracic;  Laterality: N/A;  . Endarterectomy femoral Left 08/19/2014    Procedure: PROFUNDA FEMORIS ENDARTERECTOMY ;  Surgeon: Rosetta Posner,  MD;  Location: [facility];  Service: Vascular;  Laterality: Left;  . Femoral-popliteal bypass graft Left 08/19/2014    Procedure: FEMORAL-POPLITEAL ARTERY BYPASS GRAFT WITH PROPATEN GORTEX ;  Surgeon: Rosetta Posner, MD;  Location: [facility];  Service: Vascular;  Laterality: Left;  . Patch angioplasty Left 08/19/2014    Procedure: LEFT PROFUNDA FEMORIS PATCH ANGIOPLASTY USING HEMASHIELD PLATINUM FINESSE PATCH;  Surgeon: Rosetta Posner, MD;  Location: [facility];  Service: Vascular;  Laterality: Left;  . Peripheral vascular catheterization N/A 08/05/2014    Procedure: Abdominal Aortogram w/Lower Extremity;  Surgeon: Wellington Hampshire,  MD;  Location: [facility];  Service: Cardiovascular;  Laterality: N/A;    Current Medications: Outpatient Prescriptions Prior to Visit  Medication Sig Dispense Refill  . aspirin EC 81 MG tablet Take 81 mg by mouth daily.    . carvedilol (COREG) 3.125 MG tablet Take 1 tablet (3.125 mg total) by mouth 2 (two) times daily with a meal. 60 tablet 11  . lisinopril (PRINIVIL,ZESTRIL) 40 MG tablet Take 1 tablet (40 mg total) by mouth daily. 90 tablet 3  . pravastatin (PRAVACHOL) 80 MG tablet Take 1 tablet (80 mg total) by mouth every evening. 90 tablet 3  . spironolactone (ALDACTONE) 25 MG tablet Take 1 tablet (25 mg total) by mouth daily. 30 tablet 11  . Tiotropium Bromide Monohydrate (SPIRIVA RESPIMAT) 2.5 MCG/ACT AERS Inhale 2 puffs into the lungs daily. 1 Inhaler 4   No facility-administered medications prior to visit.     Allergies:   Review of patient's allergies indicates no known allergies.   Social History   Social History  . Marital Status: Married    Spouse Name: nancy  . Number of Children: 2  . Years of Education: college   Occupational History  . retired    Social History Main Topics  . Smoking status: Current Every Day Smoker -- 2.00 packs/day for 57 years    Types: Cigarettes  . Smokeless tobacco: Never Used  . Alcohol Use: 1.8 oz/week    3 Cans of beer, 0 Standard drinks or equivalent per week     Comment: daily  . Drug Use: No  . Sexual Activity: Not Currently   Other Topics Concern  . None   Social History Narrative     Family History:  The patient's family history includes Heart Problems in his mother; Heart attack in his father. There is no history of Stroke.   ROS:   Please see the history of present illness.    ROS All other systems reviewed and are negative.   Physical Exam:    VS:  BP 130/70 mmHg  Pulse 64  Ht '5\' 8"'$  (1.727 m)  Wt 200 lb 9.6 oz (90.992 kg)  BMI 30.51 kg/m2   GEN: Well nourished, well developed, in no acute  distress HEENT: normal Neck: no JVD, no masses Cardiac: Normal S1/S2, RRR; no murmurs   Respiratory:  Decreased breath sounds bilaterally; no wheezing, rhonchi or rales GI: soft, nontender Skin: warm and dry, no rash Neuro:  Bilateral strength equal, no focal deficits  Psych: Alert and oriented x 3, normal affect  Wt Readings from Last 3 Encounters:  03/25/15 200 lb 9.6 oz (90.992 kg)  03/16/15 201 lb (91.173 kg)  03/15/15 199 lb 11.2 oz (90.583 kg)      Studies/Labs Reviewed:    EKG:  EKG is  ordered today.  The ekg ordered today demonstrates NSR, HR 64, normal axis, poor R-wave progression,  PACs, T-wave inversions in V5-V6, QTc 420 ms, no significant changes when compared to prior tracings  Recent Labs: 03/11/2015: ALT 12; BUN 16.5; Creatinine 1.2; HGB 12.5*; Platelets 143; Potassium 4.6; Sodium 137   Recent Lipid Panel    Component Value Date/Time   CHOL 216* 02/18/2015 1135   TRIG 93 02/18/2015 1135   HDL 61 02/18/2015 1135   CHOLHDL 3.5 02/18/2015 1135   VLDL 19 02/18/2015 1135   LDLCALC 136* 02/18/2015 1135    Additional studies/ records that were reviewed today include:   LHC 07/16/14 LAD mid 100% CTO, D1 80%, D2 ostial 50% LCx Roxanol 50%, mid 95%, distal 100% RCA proximal 100% CTO SVG-RPDA okay SVG-OM2 occluded SVG-D1 occluded LIMA-LAD okay EF 25% Recommendations: I would continue medical management of his CAD. The stenosis in his small caliber distal Circumflex and OM system is severe but he is having no angina and his LV dysfunction is out of proportion to this small potentially ischemic territory. I would proceed with planning for his lung tumor diagnosis. I would not recommend stenting of the small distal Circumflex at this time.   Nm Myocar Multi W/spect W/wall Motion / Ef 07/09/2014  Inferolateral scar with peri-infarct ischemia, EF 24%, high risk  Echo 07/06/14  MildLVH, EF 40-45%,Inferolateral AK, inferior AK, restrictive physiology, trivial AI, mild  MR, mod BAE, PASP 54 mmHg   ASSESSMENT:    1. Chronic systolic heart failure ([place])   2. Ischemic cardiomyopathy   3. Coronary artery disease involving autologous vein coronary bypass graft without angina pectoris   4. Benign essential HTN   5. Hyperlipidemia LDL goal <70     PLAN:    In order of problems listed above:  1. Chronic systolic CHF - He is NYHA 2-2b. He does not have any evidence of volume excess on exam. He has not had a recent admission for heart failure and I do not see an assessment of his BNP.  Heart rate is stable. Blood pressure is controlled. At this point, I do not believe that initiating Entresto, hydralazine, nitrates or Corlanor is indicated. With his HR, I do not think he can tolerate a higher dose of his beta-blocker.  Continue current therapy.  2. Ischemic cardiomyopathy - EF 25% by left ventriculogram in 5/16. EF 40-45% by echocardiogram at that same time. Continue ACE inhibitor, beta blocker, spironolactone. Repeat echo pending in 2/17. Follow-up with Dr. Radford Pax 3/17.   3. Coronary artery disease - No angina. Continue ASA, statin, ACE inhibitor.  4. Essential hypertension - BP controlled.  5. Hyperlipidemia - Continue statin.     Medication Adjustments/Labs and Tests Ordered: Current medicines are reviewed at length with the patient today.  Concerns regarding medicines are outlined above.  Medication changes, Labs and Tests ordered today are outlined in the Patient Instructions noted below. Patient Instructions  Medication Instructions:  Your physician recommends that you continue on your current medications as directed. Please refer to the Current Medication list given to you today.   Labwork: TODAY ; LAB ORDER IN SYSTEM ALREADY FROM DR. TURNER  Testing/Procedures: ECHO ALREADY SCHEDULED FOR 04/2015  Follow-Up: KEEP YOUR FOLLOW UP WITH DR. Radford Pax AS ALREADY SCHEDULED  Any Other Special Instructions Will Be Listed Below (If Applicable).   If  you need a refill on your cardiac medications before your next appointment, please call your pharmacy.    Signed, Richardson Dopp, PA-C  03/25/2015 10:36 AM    [facility] [place],  [place]  [address] Phone: [phone]; Fax: [phone]

## 2015-03-25 ENCOUNTER — Other Ambulatory Visit (INDEPENDENT_AMBULATORY_CARE_PROVIDER_SITE_OTHER): Payer: Medicare Other

## 2015-03-25 ENCOUNTER — Encounter: Payer: Self-pay | Admitting: Physician Assistant

## 2015-03-25 ENCOUNTER — Ambulatory Visit (INDEPENDENT_AMBULATORY_CARE_PROVIDER_SITE_OTHER): Payer: Medicare Other | Admitting: Physician Assistant

## 2015-03-25 VITALS — BP 130/70 | HR 64 | Ht 68.0 in | Wt 200.6 lb

## 2015-03-25 DIAGNOSIS — E785 Hyperlipidemia, unspecified: Secondary | ICD-10-CM

## 2015-03-25 DIAGNOSIS — I255 Ischemic cardiomyopathy: Secondary | ICD-10-CM | POA: Diagnosis not present

## 2015-03-25 DIAGNOSIS — I739 Peripheral vascular disease, unspecified: Secondary | ICD-10-CM

## 2015-03-25 DIAGNOSIS — I25810 Atherosclerosis of coronary artery bypass graft(s) without angina pectoris: Secondary | ICD-10-CM

## 2015-03-25 DIAGNOSIS — I10 Essential (primary) hypertension: Secondary | ICD-10-CM | POA: Diagnosis not present

## 2015-03-25 DIAGNOSIS — I5022 Chronic systolic (congestive) heart failure: Secondary | ICD-10-CM | POA: Diagnosis not present

## 2015-03-25 DIAGNOSIS — C3491 Malignant neoplasm of unspecified part of right bronchus or lung: Secondary | ICD-10-CM

## 2015-03-25 DIAGNOSIS — Z79899 Other long term (current) drug therapy: Secondary | ICD-10-CM | POA: Diagnosis not present

## 2015-03-25 LAB — BASIC METABOLIC PANEL
BUN: 17 mg/dL (ref 7–25)
CALCIUM: 9.8 mg/dL (ref 8.6–10.3)
CO2: 27 mmol/L (ref 20–31)
CREATININE: 1.23 mg/dL — AB (ref 0.70–1.18)
Chloride: 103 mmol/L (ref 98–110)
GLUCOSE: 78 mg/dL (ref 65–99)
Potassium: 4.9 mmol/L (ref 3.5–5.3)
Sodium: 137 mmol/L (ref 135–146)

## 2015-03-25 NOTE — Patient Instructions (Signed)
Medication Instructions:  Your physician recommends that you continue on your current medications as directed. Please refer to the Current Medication list given to you today.   Labwork: TODAY ; LAB ORDER IN SYSTEM ALREADY FROM DR. TURNER  Testing/Procedures: ECHO ALREADY SCHEDULED FOR 04/2015  Follow-Up: KEEP YOUR FOLLOW UP WITH DR. Radford Pax AS ALREADY SCHEDULED  Any Other Special Instructions Will Be Listed Below (If Applicable).   If you need a refill on your cardiac medications before your next appointment, please call your pharmacy.

## 2015-03-26 NOTE — Progress Notes (Signed)
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    Biopsies revealed:   07/29/14 Diagnosis 1. Endobronchial biopsy, LUL - BENIGN LUNG TISSUE. - NEGATIVE FOR ATYPIA OR MALIGNANCY. 2. Lung, biopsy, RUL - ATYPICAL EPITHELIUM, SEE COMMENT. 3. Lung, biopsy, LLL - BENIGN LUNG TISSUE SEE COMMENT. - NEGATIVE FOR ATYPIA OR MALIGNANCY.  Diagnosis TRANSBRONCHIAL NEEDLE ASPIRATION, NAVIGATION, RUL, BRUSHING, A (SPECIMEN 1 OF 4, COLLECTED ON 07/29/14): MALIGNANT CELLS PRESENT SEE COMMENT COMMENT: THE MALIGNANT CELLS ARE CONSISTENT WITH ADENOCARCINOMA. THERE IS NO CELL BLOCK AVAILABLE FOR ANCILLARY TUMOR TESTING. THE CASE WAS REVIEWED BY DR HILLARD WHO CONCURS.  Diagnosis TRANSBRONCHIAL NEEDLE ASPIRATION, NAVIGATION, RUL, B (SPECIMEN 2 OF 4, COLLECTED ON 07/29/14): MALIGNANT CELLS PRESENT SEE COMMENT COMMENT: THE MALIGNANT CELLS ARE CONSISTENT WITH ADENOCARCINOMA. THERE IS INADEQUATE TUMOR PRESENT IN THE CELL BLOCK AVAILABLE FOR ANCILLARY TUMOR TESTING.  Diagnosis WANG NEEDLE, FINE NEEDLE ASPIRATION (C) NAVIGATION RIGHT UPPER LOBE MASS, (SPECIMEN 3 OF 4, COLLECTED ON 07/29/2014): MALIGNANT CELLS PRESENT SEE COMMENT COMMENT: THE MALIGNANT CELLS ARE CONSISTENT WITH ADENOCARCINOMA. THERE IS IN ADEQUATE TUMOR PRESENT WITHIN THE CELL BLOCK AVAILABLE FOR ANCILLARY TUMOR TESTING.  Diagnosis BRONCHIAL BRUSHING (D) NAVIGATION LEFT LOWER LOBE LUNG (SPECIMEN 4 OF 4, COLLECTED ON 07/29/2014): ATYPICAL CELLS PRESENT SEE COMMENT COMMENT: THERE AER ATYPICAL EPITHELIAL CELLS PRESENT THAT ARE NOT DIAGNOSTIC OF MALIGNANCY.  Tobacco/Marijuana/Snuff/ETOH use: currently smokes 2 ppd. He has smoked for 57 years, daily alcohol use (3 cans of beer per day).  Denies snuff, marijuana use.  Past/Anticipated interventions by cardiothoracic surgery, if any: 07/29/14 - Procedure: VIDEO BRONCHOSCOPY WITH  ENDOBRONCHIAL NAVIGATION;  Surgeon: Collene Gobble, MD;  Location: [facility];  Service: Thoracic;  Laterality: N/A; no further surgery planned.  Past/Anticipated interventions by medical oncology, if any: Systemic chemotherapy carboplatin for AUC of 5 and Alimta at 500 mg/m given every 3 weeks. Status post 6 cycles in November 2016.  Signs/Symptoms  Weight changes, if any: no  Respiratory complaints, if any: yes - has a frequent cough with clear/white sputum.  Reports shortness of breath with a lot of activity.  Hemoptysis, if any: no  Pain issues, if any:  no  SAFETY ISSUES:  Prior radiation? Yes - had external beam radiation and seeds to his prostate in 2001  Pacemaker/ICD? no   Possible current pregnancy?no  Is the patient on methotrexate? no  Current Complaints / other details:  Patient had vascular surgery to his left leg on 08/19/14.  Patient is here with his wife.    BP 130/63 mmHg  Pulse 68  Temp(Src) 98.4 F (36.9 C) (Oral)  Resp 18  Ht '5\' 8"'$  (1.727 m)  Wt 200 lb 3.2 oz (90.81 kg)  BMI 30.45 kg/m2  SpO2 100%

## 2015-03-31 ENCOUNTER — Ambulatory Visit: Payer: Medicare Other | Attending: Radiation Oncology

## 2015-03-31 ENCOUNTER — Telehealth: Payer: Self-pay | Admitting: *Deleted

## 2015-03-31 ENCOUNTER — Ambulatory Visit
Admission: RE | Admit: 2015-03-31 | Discharge: 2015-03-31 | Disposition: A | Discharge status: Home / Self Care | Payer: Medicare Other | Source: Ambulatory Visit | Attending: Radiation Oncology | Admitting: Radiation Oncology

## 2015-03-31 ENCOUNTER — Encounter: Payer: Self-pay | Admitting: Radiation Oncology

## 2015-03-31 ENCOUNTER — Ambulatory Visit: Payer: Medicare Other

## 2015-03-31 VITALS — BP 130/63 | HR 68 | Temp 98.4°F | Resp 18 | Ht 68.0 in | Wt 200.2 lb

## 2015-03-31 DIAGNOSIS — C3491 Malignant neoplasm of unspecified part of right bronchus or lung: Secondary | ICD-10-CM | POA: Insufficient documentation

## 2015-03-31 DIAGNOSIS — Z510 Encounter for antineoplastic radiation therapy: Secondary | ICD-10-CM | POA: Diagnosis not present

## 2015-03-31 NOTE — Telephone Encounter (Signed)
CALLED PATIENT TO GET HIM TO COME IN EARLIER FOR CONSULT, PATIENT AGREED TO COME IN @ 2:30 PM FOR NURSE AND 3 PM FOR DR., SPOKE WITH PATIENT'S WIFE AND SHE AGREED TO THIS

## 2015-03-31 NOTE — Progress Notes (Signed)
Radiation Oncology         (336) [phone] ________________________________  Name: Steve Baker MRN: [id number]  Date: 03/31/2015  DOB: 06/07/1940  Re-evaluation Note  CC: Leonard Downing, MD  Curt Bears, MD    ICD-9-CM ICD-10-CM   1. Adenocarcinoma of right lung (HCC) 162.9 C34.91     Diagnosis: Adenocarcinoma of right lung  Staging form: Lung, AJCC 7th Edition  Clinical stage from 08/13/2014: Stage IV (T2a, N2, M1a) -   Narrative:  The patient returns today for further evaluation and consideration for radiation therapy.Marland Kitchen He was originally seen by me in multidisciplinary lung clinic on 08/13/14.  The patient has successfully completed 6 cycles of systemic chemotherapy with carboplatin and Alimta. He tolerated his treatment fairly well with no significant adverse effects. Patient was initially felt to have a solitary brain metastasis however with additional imaging this was found not to be the case. In retrospect the patient is likely a stage IIIA disease. He did have a small  nodule in the left lower lung which is been stable. Patient completed his 6 cycles of chemotherapy in November of last year.   His recent CT scan of the chest showed mild disease progression in the right upper lobe lung mass.  On today's visit, he complains of wheezing and headaches after coughing bouts. States he still is smoking.  Denies coughing up blood, no new back aches, denies food feeling stuck when eating          Prior Radiation History: Yes, prostate cancer in 2001. Brachytherapy seeds present in prostate bed.   ALLERGIES:  has No Known Allergies.  Meds: Current Outpatient Prescriptions  Medication Sig Dispense Refill  . aspirin EC 81 MG tablet Take 81 mg by mouth daily.    . carvedilol (COREG) 3.125 MG tablet Take 1 tablet (3.125 mg total) by mouth 2 (two) times daily with a meal. 60 tablet 11  . lisinopril (PRINIVIL,ZESTRIL) 40 MG tablet Take 1 tablet (40 mg total) by mouth daily. 90 tablet  3  . pravastatin (PRAVACHOL) 80 MG tablet Take 1 tablet (80 mg total) by mouth every evening. 90 tablet 3  . spironolactone (ALDACTONE) 25 MG tablet Take 1 tablet (25 mg total) by mouth daily. 30 tablet 11  . Tiotropium Bromide Monohydrate (SPIRIVA RESPIMAT) 2.5 MCG/ACT AERS Inhale 2 puffs into the lungs daily. 1 Inhaler 4   No current facility-administered medications for this encounter.    Physical Findings: The patient is in no acute distress. Patient is alert and oriented. Accompanied by wife on evaluation today  height is '5\' 8"'$  (1.727 m) and weight is 200 lb 3.2 oz (90.81 kg). His oral temperature is 98.4 F (36.9 C). His blood pressure is 130/63 and his pulse is 68. His respiration is 18 and oxygen saturation is 100%. .  No significant changes. General: Well-developed, in no acute distress HEENT: Normocephalic, atraumatic, oral cavity clear  Cardiovascular: Regular rate and rhythm Respiratory: Clear to auscultation bilaterally GI: Soft, nontender, normal bowel sounds Extremities: No edema present, motor strength 5/5 No palpable cervical, supraclavicular or axillary lymphoadenopathy  Lab Findings: Lab Results  Component Value Date   WBC 4.3 03/11/2015   HGB 12.5* 03/11/2015   HCT 38.7 03/11/2015   MCV 100.8* 03/11/2015   PLT 143 03/11/2015    Radiographic Findings: Ct Chest W Contrast  03/12/2015  CLINICAL DATA:  75 year old male with history of right-sided lung cancer diagnosed in February 2016 status post chemotherapy (completed in October 2016). Followup study. EXAM:  CT CHEST, ABDOMEN, AND PELVIS WITH CONTRAST TECHNIQUE: Multidetector CT imaging of the chest, abdomen and pelvis was performed following the standard protocol during bolus administration of intravenous contrast. CONTRAST:  155m OMNIPAQUE IOHEXOL 300 MG/ML  SOLN COMPARISON:  CT of the chest, abdomen and pelvis 01/11/2015. FINDINGS: CT CHEST FINDINGS Mediastinum/Lymph Nodes: Heart size is enlarged with mild left  ventricular dilatation (left ventricle measures up to 58 mm in diameter on today's nongated study). There is no significant pericardial fluid, thickening or pericardial calcification. There is atherosclerosis of the thoracic aorta, the great vessels of the mediastinum and the coronary arteries, including calcified atherosclerotic plaque in the left main, left anterior descending, left circumflex and right coronary arteries. Status post median sternotomy for CABG, including LIMA to the LAD. No pathologically enlarged mediastinal or hilar lymph nodes. Esophagus is unremarkable in appearance. No axillary lymphadenopathy. Lungs/Pleura: Interval enlargement of the previously noted right upper lobe pulmonary nodule, which is currently a 2.6 x 3.1 x 3.2 cm macrolobulated mass with spiculated margins extending to the overlying pleura in the apex of the right upper lobe (image 12 of series 5, and image 54 of coronal series 602). Previously noted linear density in the left lower lobe is similar in appearance on image 41 of series 5, measuring approximately 15 x 4 mm on today's study. No other new suspicious appearing pulmonary nodules or masses are noted. Small fiducial markers noted in the left lower lobe. No acute consolidative airspace disease. No pleural effusions. Musculoskeletal/Soft Tissues: Median sternotomy wires. There are no aggressive appearing lytic or blastic lesions noted in the visualized portions of the skeleton. CT ABDOMEN AND PELVIS FINDINGS Hepatobiliary: No cystic or solid hepatic lesions. No intra or extrahepatic biliary ductal dilatation. Status post cholecystectomy. Pancreas: No pancreatic mass. No pancreatic ductal dilatation. No pancreatic or peripancreatic fluid or inflammatory changes. Spleen: Unremarkable. Adrenals/Urinary Tract: Bilateral adrenal glands are normal in appearance. Mild diffuse cortical thinning in the kidneys bilaterally. No suspicious renal lesions. No hydroureteronephrosis.  Urinary bladder is normal in appearance. Stomach/Bowel: Normal appearance of the stomach. No pathologic dilatation of small bowel or colon. A few scattered colonic diverticulae are noted, without surrounding inflammatory changes to suggest an acute diverticulitis at this time. Vascular/Lymphatic: Extensive atherosclerosis throughout the abdominal and pelvic vasculature, without evidence of aneurysm or dissection. Status post aortobifemoral bypass graft placement, which appears widely patent. Native superficial femoral arteries appear occluded bilaterally, as do the native common femoral arteries bilaterally. No lymphadenopathy noted in the abdomen or pelvis. Reproductive: Brachytherapy seeds throughout the prostate gland. Calcification in the seminal vesicles bilaterally. Other: No significant volume of ascites.  No pneumoperitoneum. Musculoskeletal: There are no aggressive appearing lytic or blastic lesions noted in the visualized portions of the skeleton. IMPRESSION: 1. Today's study demonstrates slight progression of disease, with enlarging right upper lobe pulmonary mass, which currently measures 2.6 x 3.1 x 3.2 cm (previously 2.3 x 2.9 cm on prior study 01/11/2015). 2. Other area of linear scarring at site of treated left lower lobe pulmonary nodule is similar in appearance to the prior study. 3. No new nodules or findings of metastatic disease in the abdomen or pelvis. 4. Colonic diverticulosis without evidence of acute diverticulitis at this time. 5. Atherosclerosis, including left main and 3 vessel coronary artery disease. Status post median sternotomy for CABG, including LIMA to the LAD. Patient is also status post aortobifemoral bypass graft. 6. Cardiomegaly with mild left atrial dilatation cardiomegaly with mild left ventricular dilatation. Electronically Signed   By: DQuillian Quince  Entrikin M.D.   On: 03/12/2015 13:30   Ct Abdomen Pelvis W Contrast  03/12/2015  CLINICAL DATA:  75 year old male with history of  right-sided lung cancer diagnosed in February 2016 status post chemotherapy (completed in October 2016). Followup study. EXAM: CT CHEST, ABDOMEN, AND PELVIS WITH CONTRAST TECHNIQUE: Multidetector CT imaging of the chest, abdomen and pelvis was performed following the standard protocol during bolus administration of intravenous contrast. CONTRAST:  150m OMNIPAQUE IOHEXOL 300 MG/ML  SOLN COMPARISON:  CT of the chest, abdomen and pelvis 01/11/2015. FINDINGS: CT CHEST FINDINGS Mediastinum/Lymph Nodes: Heart size is enlarged with mild left ventricular dilatation (left ventricle measures up to 58 mm in diameter on today's nongated study). There is no significant pericardial fluid, thickening or pericardial calcification. There is atherosclerosis of the thoracic aorta, the great vessels of the mediastinum and the coronary arteries, including calcified atherosclerotic plaque in the left main, left anterior descending, left circumflex and right coronary arteries. Status post median sternotomy for CABG, including LIMA to the LAD. No pathologically enlarged mediastinal or hilar lymph nodes. Esophagus is unremarkable in appearance. No axillary lymphadenopathy. Lungs/Pleura: Interval enlargement of the previously noted right upper lobe pulmonary nodule, which is currently a 2.6 x 3.1 x 3.2 cm macrolobulated mass with spiculated margins extending to the overlying pleura in the apex of the right upper lobe (image 12 of series 5, and image 54 of coronal series 602). Previously noted linear density in the left lower lobe is similar in appearance on image 41 of series 5, measuring approximately 15 x 4 mm on today's study. No other new suspicious appearing pulmonary nodules or masses are noted. Small fiducial markers noted in the left lower lobe. No acute consolidative airspace disease. No pleural effusions. Musculoskeletal/Soft Tissues: Median sternotomy wires. There are no aggressive appearing lytic or blastic lesions noted in the  visualized portions of the skeleton. CT ABDOMEN AND PELVIS FINDINGS Hepatobiliary: No cystic or solid hepatic lesions. No intra or extrahepatic biliary ductal dilatation. Status post cholecystectomy. Pancreas: No pancreatic mass. No pancreatic ductal dilatation. No pancreatic or peripancreatic fluid or inflammatory changes. Spleen: Unremarkable. Adrenals/Urinary Tract: Bilateral adrenal glands are normal in appearance. Mild diffuse cortical thinning in the kidneys bilaterally. No suspicious renal lesions. No hydroureteronephrosis. Urinary bladder is normal in appearance. Stomach/Bowel: Normal appearance of the stomach. No pathologic dilatation of small bowel or colon. A few scattered colonic diverticulae are noted, without surrounding inflammatory changes to suggest an acute diverticulitis at this time. Vascular/Lymphatic: Extensive atherosclerosis throughout the abdominal and pelvic vasculature, without evidence of aneurysm or dissection. Status post aortobifemoral bypass graft placement, which appears widely patent. Native superficial femoral arteries appear occluded bilaterally, as do the native common femoral arteries bilaterally. No lymphadenopathy noted in the abdomen or pelvis. Reproductive: Brachytherapy seeds throughout the prostate gland. Calcification in the seminal vesicles bilaterally. Other: No significant volume of ascites.  No pneumoperitoneum. Musculoskeletal: There are no aggressive appearing lytic or blastic lesions noted in the visualized portions of the skeleton. IMPRESSION: 1. Today's study demonstrates slight progression of disease, with enlarging right upper lobe pulmonary mass, which currently measures 2.6 x 3.1 x 3.2 cm (previously 2.3 x 2.9 cm on prior study 01/11/2015). 2. Other area of linear scarring at site of treated left lower lobe pulmonary nodule is similar in appearance to the prior study. 3. No new nodules or findings of metastatic disease in the abdomen or pelvis. 4. Colonic  diverticulosis without evidence of acute diverticulitis at this time. 5. Atherosclerosis, including left main and  3 vessel coronary artery disease. Status post median sternotomy for CABG, including LIMA to the LAD. Patient is also status post aortobifemoral bypass graft. 6. Cardiomegaly with mild left atrial dilatation cardiomegaly with mild left ventricular dilatation. Electronically Signed   By: Vinnie Langton M.D.   On: 03/12/2015 13:30   Impression:  The patient has been diagnosed with adenocarcinoma of the right lung with an enlarging right upper lobe lung mass now measured at 2.3 x 3.1 x 3.2 cm.  The patient would be a good candidate to receive radiation treatment to the lungs and paratracheal lymph nodes. Since this is the only area of active disease and in retrospect the patient may have had stage IIIa disease,  I will likely be more aggressive with his treatments and continue over approximately 6 weeks.  Plan:  We discussed the possible side effects and risks of treatment in addition to the possible benefits of treatment. We discussed the protocol for radiation treatment.  All of the patient's questions were answered. The patient does wish to proceed with this treatment. A simulation will be scheduled such that we can proceed with treatment planning. Simulation scheduled for January 31st.    -----------------------------------  Blair Promise, PhD, MD  This document serves as a record of services personally performed by Gery Pray, MD. It was created on his behalf by Derek Mound, a trained medical scribe. The creation of this record is based on the scribe's personal observations and the provider's statements to them. This document has been checked and approved by the attending provider.

## 2015-03-31 NOTE — Progress Notes (Signed)
Please see the Nurse Progress Note in the MD Initial Consult Encounter for this patient. 

## 2015-04-01 ENCOUNTER — Other Ambulatory Visit: Payer: Medicare Other

## 2015-04-01 NOTE — Addendum Note (Signed)
Addended by: Eulis Foster on: 04/01/2015 08:35 AM   Modules accepted: Orders

## 2015-04-02 ENCOUNTER — Other Ambulatory Visit (INDEPENDENT_AMBULATORY_CARE_PROVIDER_SITE_OTHER): Payer: Medicare Other | Admitting: *Deleted

## 2015-04-02 DIAGNOSIS — E785 Hyperlipidemia, unspecified: Secondary | ICD-10-CM | POA: Diagnosis not present

## 2015-04-02 LAB — LIPID PANEL
CHOLESTEROL: 173 mg/dL (ref 125–200)
HDL: 51 mg/dL (ref >=40)
LDL CALC: 98 mg/dL (ref <130)
Total CHOL/HDL Ratio: 3.4 Ratio (ref <=5.0)
Triglycerides: 119 mg/dL (ref <150)
VLDL: 24 mg/dL (ref <30)

## 2015-04-02 LAB — ALT: ALT: 10 U/L (ref 9–46)

## 2015-04-05 ENCOUNTER — Telehealth: Payer: Self-pay | Admitting: *Deleted

## 2015-04-05 DIAGNOSIS — E785 Hyperlipidemia, unspecified: Secondary | ICD-10-CM

## 2015-04-05 DIAGNOSIS — I25810 Atherosclerosis of coronary artery bypass graft(s) without angina pectoris: Secondary | ICD-10-CM

## 2015-04-05 MED ORDER — ROSUVASTATIN CALCIUM 20 MG PO TABS: 20.0000 mg | ORAL_TABLET | Freq: Every day | ORAL | Status: DC

## 2015-04-05 NOTE — Telephone Encounter (Signed)
S/w wife about lab results and findings for pt. We will d/c pravastating and crestor 20 mg daily, LFT/FLP 05/17/15. Need DPR to be filled out next time in the office. Wife verbalized understanding. Wife was out hiking during our call.

## 2015-04-06 ENCOUNTER — Ambulatory Visit
Admission: RE | Admit: 2015-04-06 | Discharge: 2015-04-06 | Disposition: A | Discharge status: Home / Self Care | Payer: Medicare Other | Source: Ambulatory Visit | Attending: Radiation Oncology | Admitting: Radiation Oncology

## 2015-04-06 DIAGNOSIS — C3491 Malignant neoplasm of unspecified part of right bronchus or lung: Secondary | ICD-10-CM

## 2015-04-06 DIAGNOSIS — Z510 Encounter for antineoplastic radiation therapy: Secondary | ICD-10-CM | POA: Diagnosis not present

## 2015-04-08 NOTE — Progress Notes (Signed)
  Radiation Oncology         [phone] ________________________________  Name: Steve Baker MRN: [id number]  Date: 04/06/2015  DOB: 10-Jun-1940  SIMULATION AND TREATMENT PLANNING NOTE    ICD-9-CM ICD-10-CM   1. Adenocarcinoma of right lung (HCC) 162.9 C34.91     DIAGNOSIS:  Stage III adenocarcinoma of the right lung  NARRATIVE:  The patient was brought to [facility].  Identity was confirmed.  All relevant records and images related to the planned course of therapy were reviewed.  The patient freely provided informed written consent to proceed with treatment after reviewing the details related to the planned course of therapy. The consent form was witnessed and verified by the simulation staff.  Then, the patient was set-up in a stable reproducible  supine position for radiation therapy.  CT images were obtained.  Surface markings were placed.  The CT images were loaded into the planning software.  Then the target and avoidance structures were contoured.  Treatment planning then occurred.  The radiation prescription was entered and confirmed.  Then, I designed and supervised the construction of a total of 5 medically necessary complex treatment devices.  I have requested : 3D Simulation  I have requested a DVH of the following structures: GTV.PTV, heart, lungs, esophagus.  I have ordered:dose calc.  PLAN:  The patient will receive 50.4 Gy in 28 fractions directed at the right upper lung mass and right paratracheal lymph node region.  Patient will then undergo additional planning and continue with a boost directed at the right upper lung mass and continue with an additional 12.6 gray for a cumulative dose of 63 gray.    Blair Promise, PhD, MD  ________________________________

## 2015-04-12 DIAGNOSIS — Z510 Encounter for antineoplastic radiation therapy: Secondary | ICD-10-CM | POA: Diagnosis not present

## 2015-04-13 ENCOUNTER — Ambulatory Visit
Admission: RE | Admit: 2015-04-13 | Discharge: 2015-04-13 | Disposition: A | Discharge status: Home / Self Care | Payer: Medicare Other | Source: Ambulatory Visit | Attending: Radiation Oncology | Admitting: Radiation Oncology

## 2015-04-13 DIAGNOSIS — Z510 Encounter for antineoplastic radiation therapy: Secondary | ICD-10-CM | POA: Diagnosis not present

## 2015-04-13 DIAGNOSIS — C3491 Malignant neoplasm of unspecified part of right bronchus or lung: Secondary | ICD-10-CM

## 2015-04-13 NOTE — Progress Notes (Signed)
  Radiation Oncology         [phone] ________________________________  Name: Steve Baker MRN: [id number]  Date: 04/13/2015  DOB: 04/29/1940  Simulation Verification Note    ICD-9-CM ICD-10-CM   1. 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 The clinical setup was verified. Then port films were obtained and uploaded to the radiation oncology medical record software.  The treatment beams were carefully compared against the planned radiation fields. The position location and shape of the radiation fields was reviewed. They targeted volume of tissue appears to be appropriately covered by the radiation beams. Organs at risk appear to be excluded as planned.  Based on my personal review, I approved the simulation verification. The patient's treatment will proceed as planned.  -----------------------------------  Blair Promise, PhD, MD

## 2015-04-14 ENCOUNTER — Ambulatory Visit
Admission: RE | Admit: 2015-04-14 | Discharge: 2015-04-14 | Disposition: A | Discharge status: Home / Self Care | Payer: Medicare Other | Source: Ambulatory Visit | Attending: Radiation Oncology | Admitting: Radiation Oncology

## 2015-04-14 DIAGNOSIS — Z510 Encounter for antineoplastic radiation therapy: Secondary | ICD-10-CM | POA: Diagnosis not present

## 2015-04-15 ENCOUNTER — Other Ambulatory Visit: Payer: Self-pay | Admitting: Emergency Medicine

## 2015-04-15 ENCOUNTER — Ambulatory Visit
Admission: RE | Admit: 2015-04-15 | Discharge: 2015-04-15 | Disposition: A | Discharge status: Home / Self Care | Payer: Medicare Other | Source: Ambulatory Visit | Attending: Radiation Oncology | Admitting: Radiation Oncology

## 2015-04-15 DIAGNOSIS — Z510 Encounter for antineoplastic radiation therapy: Secondary | ICD-10-CM | POA: Diagnosis not present

## 2015-04-16 ENCOUNTER — Ambulatory Visit
Admission: RE | Admit: 2015-04-16 | Discharge: 2015-04-16 | Disposition: A | Discharge status: Home / Self Care | Payer: Medicare Other | Source: Ambulatory Visit | Attending: Radiation Oncology | Admitting: Radiation Oncology

## 2015-04-16 DIAGNOSIS — C3491 Malignant neoplasm of unspecified part of right bronchus or lung: Secondary | ICD-10-CM | POA: Insufficient documentation

## 2015-04-16 DIAGNOSIS — Z510 Encounter for antineoplastic radiation therapy: Secondary | ICD-10-CM | POA: Diagnosis not present

## 2015-04-16 MED ORDER — RADIAPLEXRX EX GEL
Freq: Once | CUTANEOUS | Status: AC
  Administered 2015-04-16: 15:00:00 via TOPICAL

## 2015-04-16 NOTE — Progress Notes (Signed)
Pt here for patient teaching.  Pt given Radiation and You booklet and Radiaplex gel. Pt reports they have not watched the Radiation Therapy Education video and has been given the link to watch at home.  Reviewed areas of pertinence such as fatigue, skin changes, throat changes and cough . Pt able to give teach back of to pat skin and use unscented/gentle soap,apply Radiaplex bid and avoid applying anything to skin within 4 hours of treatment. Pt demonstrated understanding and verbalizes understanding of information given and will contact nursing with any questions or concerns.     [email or web address]

## 2015-04-19 ENCOUNTER — Ambulatory Visit
Admission: RE | Admit: 2015-04-19 | Discharge: 2015-04-19 | Disposition: A | Discharge status: Home / Self Care | Payer: Medicare Other | Source: Ambulatory Visit | Attending: Radiation Oncology | Admitting: Radiation Oncology

## 2015-04-19 DIAGNOSIS — Z510 Encounter for antineoplastic radiation therapy: Secondary | ICD-10-CM | POA: Diagnosis not present

## 2015-04-20 ENCOUNTER — Ambulatory Visit
Admission: RE | Admit: 2015-04-20 | Discharge: 2015-04-20 | Disposition: A | Discharge status: Home / Self Care | Payer: Medicare Other | Source: Ambulatory Visit | Attending: Radiation Oncology | Admitting: Radiation Oncology

## 2015-04-20 ENCOUNTER — Encounter: Payer: Self-pay | Admitting: Radiation Oncology

## 2015-04-20 VITALS — BP 149/87 | HR 61 | Temp 97.5°F | Resp 16 | Ht 68.0 in | Wt 203.1 lb

## 2015-04-20 DIAGNOSIS — Z510 Encounter for antineoplastic radiation therapy: Secondary | ICD-10-CM | POA: Diagnosis not present

## 2015-04-20 DIAGNOSIS — C3491 Malignant neoplasm of unspecified part of right bronchus or lung: Secondary | ICD-10-CM

## 2015-04-20 NOTE — Progress Notes (Signed)
  Radiation Oncology         [phone] ________________________________  Name: Steve Baker MRN: [id number]  Date: 04/20/2015  DOB: 1941-01-14  Weekly Radiation Therapy Management  Current Dose: 9 Gy     Planned Dose:  63 Gy  Narrative . . . . . . . . The patient presents for routine under treatment assessment.                                 He has completed 5 fractions to his lung. He denies having pain, sore throat, trouble swallowing, fatigue or skin irritation. He continues to have shortness of breath. He reports having a productive cough with clear sputum.                                  Set-up films were reviewed.                                 The chart was checked. Physical Findings. . .  height is '5\' 8"'$  (1.727 m) and weight is 203 lb 1.6 oz (92.126 kg). His oral temperature is 97.5 F (36.4 C). His blood pressure is 149/87 and his pulse is 61. His respiration is 16 and oxygen saturation is 98%. . Weight essentially stable.  No significant changes. Lungs clear, heart regular rhythm and rate. Impression . . . . . . . The patient is tolerating radiation. Plan . . . . . . . . . . . . Continue treatment as planned.  ________________________________   Blair Promise, PhD, MD

## 2015-04-20 NOTE — Progress Notes (Signed)
Steve Baker has completed 5 fractions to his lung.  He denies having pain, sore throat, trouble swallowing, fatigue or skin irritation.  He continues to have shortness of breath.  He reports having a productive cough with clear sputum.    BP 149/87 mmHg  Pulse 61  Temp(Src) 97.5 F (36.4 C) (Oral)  Resp 16  Ht '5\' 8"'$  (1.727 m)  Wt 203 lb 1.6 oz (92.126 kg)  BMI 30.89 kg/m2  SpO2 98%

## 2015-04-21 ENCOUNTER — Other Ambulatory Visit: Payer: Self-pay

## 2015-04-21 ENCOUNTER — Ambulatory Visit (HOSPITAL_COMMUNITY): Payer: Medicare Other | Attending: Cardiovascular Disease

## 2015-04-21 ENCOUNTER — Ambulatory Visit
Admission: RE | Admit: 2015-04-21 | Discharge: 2015-04-21 | Disposition: A | Discharge status: Home / Self Care | Payer: Medicare Other | Source: Ambulatory Visit | Attending: Radiation Oncology | Admitting: Radiation Oncology

## 2015-04-21 DIAGNOSIS — I517 Cardiomegaly: Secondary | ICD-10-CM | POA: Diagnosis not present

## 2015-04-21 DIAGNOSIS — I429 Cardiomyopathy, unspecified: Secondary | ICD-10-CM | POA: Diagnosis present

## 2015-04-21 DIAGNOSIS — I10 Essential (primary) hypertension: Secondary | ICD-10-CM | POA: Diagnosis not present

## 2015-04-21 DIAGNOSIS — E785 Hyperlipidemia, unspecified: Secondary | ICD-10-CM | POA: Diagnosis not present

## 2015-04-21 DIAGNOSIS — R29898 Other symptoms and signs involving the musculoskeletal system: Secondary | ICD-10-CM | POA: Diagnosis not present

## 2015-04-21 DIAGNOSIS — Z510 Encounter for antineoplastic radiation therapy: Secondary | ICD-10-CM | POA: Diagnosis not present

## 2015-04-21 DIAGNOSIS — Z951 Presence of aortocoronary bypass graft: Secondary | ICD-10-CM | POA: Insufficient documentation

## 2015-04-21 DIAGNOSIS — I420 Dilated cardiomyopathy: Secondary | ICD-10-CM | POA: Insufficient documentation

## 2015-04-22 ENCOUNTER — Ambulatory Visit
Admission: RE | Admit: 2015-04-22 | Discharge: 2015-04-22 | Disposition: A | Discharge status: Home / Self Care | Payer: Medicare Other | Source: Ambulatory Visit | Attending: Radiation Oncology | Admitting: Radiation Oncology

## 2015-04-22 DIAGNOSIS — Z510 Encounter for antineoplastic radiation therapy: Secondary | ICD-10-CM | POA: Diagnosis not present

## 2015-04-23 ENCOUNTER — Ambulatory Visit
Admission: RE | Admit: 2015-04-23 | Discharge: 2015-04-23 | Disposition: A | Discharge status: Home / Self Care | Payer: Medicare Other | Source: Ambulatory Visit | Attending: Radiation Oncology | Admitting: Radiation Oncology

## 2015-04-23 DIAGNOSIS — Z510 Encounter for antineoplastic radiation therapy: Secondary | ICD-10-CM | POA: Diagnosis not present

## 2015-04-26 ENCOUNTER — Ambulatory Visit
Admission: RE | Admit: 2015-04-26 | Discharge: 2015-04-26 | Disposition: A | Discharge status: Home / Self Care | Payer: Medicare Other | Source: Ambulatory Visit | Attending: Radiation Oncology | Admitting: Radiation Oncology

## 2015-04-26 DIAGNOSIS — Z510 Encounter for antineoplastic radiation therapy: Secondary | ICD-10-CM | POA: Diagnosis not present

## 2015-04-27 ENCOUNTER — Ambulatory Visit
Admission: RE | Admit: 2015-04-27 | Discharge: 2015-04-27 | Disposition: A | Discharge status: Home / Self Care | Payer: Medicare Other | Source: Ambulatory Visit | Attending: Radiation Oncology | Admitting: Radiation Oncology

## 2015-04-27 ENCOUNTER — Ambulatory Visit: Payer: Medicare Other | Admitting: Emergency Medicine

## 2015-04-27 ENCOUNTER — Encounter: Payer: Self-pay | Admitting: Radiation Oncology

## 2015-04-27 VITALS — BP 156/80 | HR 64 | Temp 97.8°F | Resp 16 | Ht 68.0 in | Wt 200.7 lb

## 2015-04-27 DIAGNOSIS — Z510 Encounter for antineoplastic radiation therapy: Secondary | ICD-10-CM | POA: Diagnosis not present

## 2015-04-27 DIAGNOSIS — C3491 Malignant neoplasm of unspecified part of right bronchus or lung: Secondary | ICD-10-CM

## 2015-04-27 NOTE — Progress Notes (Signed)
  Radiation Oncology         [phone] ________________________________  Name: Steve Baker MRN: [id number]  Date: 04/27/2015  DOB: [birth year]  Weekly Radiation Therapy Management  DIAGNOSIS: Stage III adenocarcinoma of the right lung  Current Dose: 18 Gy     Planned Dose:  63 Gy  Narrative . . . . . . . . The patient presents for routine under treatment assessment. Steve Baker has completed 10 fractions to his lung. He denies chest pain. He reports shortness of breath, but it is not any worse since starting radiation. He reports a productive cough with clear sputum. He denies hemoptysis or sore throat/difficulty swallowing. He reports fatigue. He continues to smoke 2 packs per day. The nurse advised him to apply radiaplex to the treatment area twice a day.                                 Set-up films were reviewed.                                 The chart was checked. Physical Findings. . .  height is '5\' 8"'$  (1.727 m) and weight is 200 lb 11.2 oz (91.037 kg). His oral temperature is 97.8 F (36.6 C). His blood pressure is 156/80 and his pulse is 64. His respiration is 16 and oxygen saturation is 98%. . Weight essentially stable.  No significant changes. Lungs clear, heart regular rhythm and rate. Impression . . . . . . . The patient is tolerating radiation. Plan . . . . . . . . . . . . Continue treatment as planned.  ________________________________   Blair Promise, PhD, MD  This document serves as a record of services personally performed by Gery Pray, MD. It was created on his behalf by Darcus Austin, a trained medical scribe. The creation of this record is based on the scribe's personal observations and the provider's statements to them. This document has been checked and approved by the attending provider.

## 2015-04-27 NOTE — Progress Notes (Signed)
Steve Baker has completed 10 fractions to his lung.  He denies having pain.  He reports shortness of breath but it is not any worse since starting radiation.  He reports having a productive cough with clear sputum.  He denies hemoptysis or sore throat/trouble swallowing.  He reports feeling fatigued.  He continues to smoke 2 packs per day.  The skin on his chest is intact.  The skin on his upper back is red.  Advised him to apply radiaplex twice a day.  BP 156/80 mmHg  Pulse 64  Temp(Src) 97.8 F (36.6 C) (Oral)  Resp 16  Ht '5\' 8"'$  (1.727 m)  Wt 200 lb 11.2 oz (91.037 kg)  BMI 30.52 kg/m2  SpO2 98%

## 2015-04-28 ENCOUNTER — Ambulatory Visit
Admission: RE | Admit: 2015-04-28 | Discharge: 2015-04-28 | Disposition: A | Discharge status: Home / Self Care | Payer: Medicare Other | Source: Ambulatory Visit | Attending: Radiation Oncology | Admitting: Radiation Oncology

## 2015-04-28 DIAGNOSIS — Z510 Encounter for antineoplastic radiation therapy: Secondary | ICD-10-CM | POA: Diagnosis not present

## 2015-04-29 ENCOUNTER — Ambulatory Visit
Admission: RE | Admit: 2015-04-29 | Discharge: 2015-04-29 | Disposition: A | Discharge status: Home / Self Care | Payer: Medicare Other | Source: Ambulatory Visit | Attending: Radiation Oncology | Admitting: Radiation Oncology

## 2015-04-29 DIAGNOSIS — Z510 Encounter for antineoplastic radiation therapy: Secondary | ICD-10-CM | POA: Diagnosis not present

## 2015-04-30 ENCOUNTER — Ambulatory Visit
Admission: RE | Admit: 2015-04-30 | Discharge: 2015-04-30 | Disposition: A | Discharge status: Home / Self Care | Payer: Medicare Other | Source: Ambulatory Visit | Attending: Radiation Oncology | Admitting: Radiation Oncology

## 2015-04-30 DIAGNOSIS — Z510 Encounter for antineoplastic radiation therapy: Secondary | ICD-10-CM | POA: Diagnosis not present

## 2015-05-03 ENCOUNTER — Ambulatory Visit
Admission: RE | Admit: 2015-05-03 | Discharge: 2015-05-03 | Disposition: A | Discharge status: Home / Self Care | Payer: Medicare Other | Source: Ambulatory Visit | Attending: Radiation Oncology | Admitting: Radiation Oncology

## 2015-05-03 DIAGNOSIS — Z510 Encounter for antineoplastic radiation therapy: Secondary | ICD-10-CM | POA: Diagnosis not present

## 2015-05-04 ENCOUNTER — Ambulatory Visit
Admission: RE | Admit: 2015-05-04 | Discharge: 2015-05-04 | Disposition: A | Discharge status: Home / Self Care | Payer: Medicare Other | Source: Ambulatory Visit | Attending: Radiation Oncology | Admitting: Radiation Oncology

## 2015-05-04 ENCOUNTER — Encounter: Payer: Self-pay | Admitting: Radiation Oncology

## 2015-05-04 VITALS — BP 135/55 | HR 67 | Temp 98.2°F | Ht 68.0 in | Wt 199.3 lb

## 2015-05-04 DIAGNOSIS — Z510 Encounter for antineoplastic radiation therapy: Secondary | ICD-10-CM | POA: Diagnosis not present

## 2015-05-04 DIAGNOSIS — C3491 Malignant neoplasm of unspecified part of right bronchus or lung: Secondary | ICD-10-CM

## 2015-05-04 NOTE — Progress Notes (Signed)
Steve Baker has completed 15 fractions to his lung.  He denies pain, increased shortness of breath, sore throat or trouble swallowing.  He continues to report having a productive cough with clear sputum.  He reports having fatigue and denies having skin irritation.  BP 135/55 mmHg  Pulse 67  Temp(Src) 98.2 F (36.8 C) (Oral)  Ht '5\' 8"'$  (1.727 m)  Wt 199 lb 4.8 oz (90.402 kg)  BMI 30.31 kg/m2  SpO2 98%   Wt Readings from Last 3 Encounters:  05/04/15 199 lb 4.8 oz (90.402 kg)  04/27/15 200 lb 11.2 oz (91.037 kg)  04/20/15 203 lb 1.6 oz (92.126 kg)

## 2015-05-04 NOTE — Progress Notes (Signed)
  Radiation Oncology         (336) [phone] ________________________________  Name: Clemens Lachman MRN: [id number]  Date: 05/04/2015  DOB: Jun 01, 1940  Weekly Radiation Therapy Management    ICD-9-CM ICD-10-CM   1. 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 . . . . . . . The patient presents for routine under treatment assessment.                   Berman Mccaskill has completed 15 fractions to his lung. He denies pain, increased shortness of breath, sore throat or trouble swallowing. He continues to report having a productive cough with clear sputum. He reports having fatigue and denies having skin irritation.  Patient complains of some soreness in the anterior neck region and a mild sore throat                                   Set-up films were reviewed.                                 The chart was checked. Physical Findings. . .  height is '5\' 8"'$  (1.727 m) and weight is 199 lb 4.8 oz (90.402 kg). His oral temperature is 98.2 F (36.8 C). His blood pressure is 135/55 and his pulse is 67. His oxygen saturation is 98%. . The lungs are clear. No palpable adenopathy in the neck region. The oropharynx is clear. Possibly a small papule on the left tonsillar fossa  The heart has a regular rhythm and rate.  Impression . . . . . . . The patient is tolerating radiation. Plan . . . . . . . . . . . . Continue treatment as planned.  ________________________________   Blair Promise, PhD, MD

## 2015-05-05 ENCOUNTER — Ambulatory Visit
Admission: RE | Admit: 2015-05-05 | Discharge: 2015-05-05 | Disposition: A | Discharge status: Home / Self Care | Payer: Medicare Other | Source: Ambulatory Visit | Attending: Radiation Oncology | Admitting: Radiation Oncology

## 2015-05-05 DIAGNOSIS — Z510 Encounter for antineoplastic radiation therapy: Secondary | ICD-10-CM | POA: Diagnosis not present

## 2015-05-06 ENCOUNTER — Ambulatory Visit
Admission: RE | Admit: 2015-05-06 | Discharge: 2015-05-06 | Disposition: A | Discharge status: Home / Self Care | Payer: Medicare Other | Source: Ambulatory Visit | Attending: Radiation Oncology | Admitting: Radiation Oncology

## 2015-05-06 DIAGNOSIS — Z510 Encounter for antineoplastic radiation therapy: Secondary | ICD-10-CM | POA: Diagnosis not present

## 2015-05-07 ENCOUNTER — Ambulatory Visit
Admission: RE | Admit: 2015-05-07 | Discharge: 2015-05-07 | Disposition: A | Discharge status: Home / Self Care | Payer: Medicare Other | Source: Ambulatory Visit | Attending: Radiation Oncology | Admitting: Radiation Oncology

## 2015-05-07 DIAGNOSIS — Z510 Encounter for antineoplastic radiation therapy: Secondary | ICD-10-CM | POA: Diagnosis not present

## 2015-05-10 ENCOUNTER — Ambulatory Visit
Admission: RE | Admit: 2015-05-10 | Discharge: 2015-05-10 | Disposition: A | Discharge status: Home / Self Care | Payer: Medicare Other | Source: Ambulatory Visit | Attending: Radiation Oncology | Admitting: Radiation Oncology

## 2015-05-10 DIAGNOSIS — Z510 Encounter for antineoplastic radiation therapy: Secondary | ICD-10-CM | POA: Diagnosis not present

## 2015-05-11 ENCOUNTER — Ambulatory Visit
Admission: RE | Admit: 2015-05-11 | Discharge: 2015-05-11 | Disposition: A | Discharge status: Home / Self Care | Payer: Medicare Other | Source: Ambulatory Visit | Attending: Radiation Oncology | Admitting: Radiation Oncology

## 2015-05-11 ENCOUNTER — Encounter: Payer: Self-pay | Admitting: Radiation Oncology

## 2015-05-11 VITALS — BP 147/70 | HR 67 | Temp 97.8°F | Ht 68.0 in | Wt 199.0 lb

## 2015-05-11 DIAGNOSIS — C3491 Malignant neoplasm of unspecified part of right bronchus or lung: Secondary | ICD-10-CM

## 2015-05-11 DIAGNOSIS — Z510 Encounter for antineoplastic radiation therapy: Secondary | ICD-10-CM | POA: Diagnosis not present

## 2015-05-11 NOTE — Progress Notes (Signed)
Elester Vernier has completed 20 fractions to his lung.  He denies having pain, sore throat or trouble swallowing.  He reports his cough is the same with clear sputum.  He reports shortness of breath but said it has not increased since radiation started.  He denies having any skin irritation.  He reports having fatigue.  BP 147/70 mmHg  Pulse 67  Temp(Src) 97.8 F (36.6 C) (Oral)  Ht '5\' 8"'$  (1.727 m)  Wt 199 lb (90.266 kg)  BMI 30.26 kg/m2  SpO2 98%   Wt Readings from Last 3 Encounters:  05/11/15 199 lb (90.266 kg)  05/04/15 199 lb 4.8 oz (90.402 kg)  04/27/15 200 lb 11.2 oz (91.037 kg)

## 2015-05-11 NOTE — Progress Notes (Signed)
  Radiation Oncology         [phone] ________________________________  Name: Steve Baker MRN: [id number]  Date: 05/11/2015  DOB: Feb 18, 1941  Weekly Radiation Therapy Management    ICD-9-CM ICD-10-CM   1. Adenocarcinoma of right lung (HCC) 162.9 C34.91      Current Dose: 36 Gy     Planned Dose:  62.4 Gy  Narrative . . . . . . . . The patient presents for routine under treatment assessment.                                   Steve Baker has completed 20 fractions to his lung. He denies having pain, sore throat or trouble swallowing. He reports his cough is the same with clear sputum. He reports shortness of breath but said it has not increased since radiation started. He denies having any skin irritation. He reports having fatigue.                                 Set-up films were reviewed.                                 The chart was checked. Physical Findings. . .  height is '5\' 8"'$  (1.727 m) and weight is 199 lb (90.266 kg). His oral temperature is 97.8 F (36.6 C). His blood pressure is 147/70 and his pulse is 67. His oxygen saturation is 98%. . Weight essentially stable.  No significant changes. The lungs are clear. The heart has a regular rhythm and rate. Impression . . . . . . . The patient is tolerating radiation. Plan . . . . . . . . . . . . Continue treatment as planned.  ________________________________   Blair Promise, PhD, MD

## 2015-05-12 ENCOUNTER — Ambulatory Visit
Admission: RE | Admit: 2015-05-12 | Discharge: 2015-05-12 | Disposition: A | Discharge status: Home / Self Care | Payer: Medicare Other | Source: Ambulatory Visit | Attending: Radiation Oncology | Admitting: Radiation Oncology

## 2015-05-12 DIAGNOSIS — Z510 Encounter for antineoplastic radiation therapy: Secondary | ICD-10-CM | POA: Diagnosis not present

## 2015-05-13 ENCOUNTER — Ambulatory Visit (INDEPENDENT_AMBULATORY_CARE_PROVIDER_SITE_OTHER): Payer: Medicare Other | Admitting: Emergency Medicine

## 2015-05-13 ENCOUNTER — Encounter: Payer: Self-pay | Admitting: Emergency Medicine

## 2015-05-13 ENCOUNTER — Ambulatory Visit
Admission: RE | Admit: 2015-05-13 | Discharge: 2015-05-13 | Disposition: A | Discharge status: Home / Self Care | Payer: Medicare Other | Source: Ambulatory Visit | Attending: Radiation Oncology | Admitting: Radiation Oncology

## 2015-05-13 VITALS — BP 130/74 | HR 64 | Ht 68.0 in | Wt 197.0 lb

## 2015-05-13 DIAGNOSIS — C3491 Malignant neoplasm of unspecified part of right bronchus or lung: Secondary | ICD-10-CM | POA: Diagnosis not present

## 2015-05-13 DIAGNOSIS — J438 Other emphysema: Secondary | ICD-10-CM | POA: Diagnosis not present

## 2015-05-13 DIAGNOSIS — Z510 Encounter for antineoplastic radiation therapy: Secondary | ICD-10-CM | POA: Diagnosis not present

## 2015-05-13 NOTE — Assessment & Plan Note (Signed)
Fairly stable. He does have daily symptoms that are primarily cough and exertional dyspnea. He has not had any exacerbations. Continued smoking is a huge problem but he has not been ready or willing to stop. We will continue Spiriva and albuterol when necessary

## 2015-05-13 NOTE — Progress Notes (Signed)
Subjective:    Patient ID: Steve Baker, male    DOB: [birth year], 75 y.o.   MRN: [id number]  HPI 75 year old active smoker (39 pk-yrs) with history of CAD/CABG, abdominal aneurysm repair, hypertension, prostate CA (2001), hyperlipidemia. He underwent a screening CT scan of the chest 06/04/14 ordered by Dr. Arelia Sneddon that showed a dominant right upper lobe mass and a smaller left lower lobe nodule. He was evaluated in the lung cancer screening program and a PET scan has been obtained 06/18/14 > both lesions are hypermetabolic, as was a slightly enlarged right peritracheal node. There was evidence of pharyngeal esophageal activity without a corresponding anatomical abnormality on CT scan.   He denies any hemoptysis although he has had frequent cough. He has dyspnea that appears to be stable. He is not on any bronchodilators at this time. He has about 6 pounds of unintentional weight loss over the last month.   ROV 08/11/14 -- follow-up visit for tobacco use, presumed COPD, and newly diagnosed adenocarcinoma of the lung. This was obtained from biopsy of his right upper lobe mass. He also had a smaller left lower lobe nodule. Cytology on this was not definitive for malignancy. I placed fiducial markers in the right upper lobe mass and also triangulating the left lower lobe nodule. He has a mediastinal node that is hypermetabolic on PET scan. I considered biopsying this but I do not believe it'll change his management because he is a poor surgical candidate based on his clinical status. He is scheduled for [place] this Thursday.  Hx taken from the pt and his wife. He is still having cough, has some exertional SOB.   ROV 12/22/14 -- follow-up visit for COPD, tobacco use, and adenocarcinoma the lung for which she is undergoing chemotherapy with Dr. Julien Nordmann. Last Ct scan chest 11/05/14 showed that RUL mass and LLL nodule were shrinking. His breathing continues to limit him. He is on spiriva although unsure whether this helps  him. He has daily cough, prod of clear to white. Smoking 1 pk a day.   ROV 05/13/15 -- this is a follow-up visit for history of COPD, continued tobacco abuse and adenocarcinoma the lung. His last CT scan of the chest was 03/12/15 which showed some slight interval increase in his right upper lobe mass. He has started radiation therapy treatment, follows with Dr Sondra Come. Plan for CT chest in April. He has exertional SOB, has sore throat, lot of cough. Prod of thick white / clear.  Bothers him most days. Tolerates spiriva.  Has SABA available but has not used. Smoking 2.5 pk/day.  No flares, ED, pred , abx.       Review of Systems As per HPI   Past Medical History  Diagnosis Date  . Hyperlipidemia   . Irregular heart beat     Pt stated his heart skips a beat every now and then  . Shortness of breath dyspnea     with ambulation  . Peripheral vascular disease ([place])     s/p aortobifemoral bypass and left profundus femoral endarterectomy with patch angioplasty and left femoral to above the knee popliteal bypass  . Anxiety   . Hypertension     lisinopril  . Dysrhythmia   . COPD (chronic obstructive pulmonary disease) ([place])   . History of bronchitis   . Anemia   . Prostate cancer ([place]) 2001    seeds  . 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 Lung cancer ([place])  adenocarcinoma of right lung  . Full code status 12/23/2014  . Hyperlipidemia LDL goal <70 02/18/2015  . DCM (dilated cardiomyopathy) ([place]) 02/18/2015  . Coronary artery disease     s/p CABG now with 2/4 grafts patent by cath 2016 - medical management     Family History  Problem Relation Age of Onset  . Heart Problems Mother   . Heart attack Father   . Stroke Neg Hx   . Colon cancer Sister      Social History   Social History  . Marital Status: Married    Spouse Name: nancy  . Number of Children: 2  . Years of Education: college   Occupational History  . retired    Social History Main Topics  . Smoking  status: Current Every Day Smoker -- 2.50 packs/day for 57 years    Types: Cigarettes  . Smokeless tobacco: Never Used  . Alcohol Use: 1.8 oz/week    3 Cans of beer, 0 Standard drinks or equivalent per week     Comment: daily  . Drug Use: No  . Sexual Activity: Not Currently   Other Topics Concern  . 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 aspirin EC 81 MG tablet Take 81 mg by mouth daily.    . carvedilol (COREG) 3.125 MG tablet Take 1 tablet (3.125 mg total) by mouth 2 (two) times daily with a meal. 60 tablet 11  . hyaluronate sodium (RADIAPLEXRX) GEL Apply 1 application topically 2 (two) times daily. Reported on 04/27/2015    . lisinopril (PRINIVIL,ZESTRIL) 40 MG tablet Take 1 tablet (40 mg total) by mouth daily. 90 tablet 3  . rosuvastatin (CRESTOR) 20 MG tablet Take 1 tablet (20 mg total) by mouth daily. 30 tablet 11  . SPIRIVA RESPIMAT 2.5 MCG/ACT AERS INHALE TWO SPRAY(S) BY MOUTH ONCE DAILY 1 Inhaler 5  . spironolactone (ALDACTONE) 25 MG tablet Take 1 tablet (25 mg total) by mouth daily. 30 tablet 11   No facility-administered medications prior to visit.       Objective:   Physical Exam Filed Vitals:   05/13/15 1209  BP: 130/74  Pulse: 64  Height: '5\' 8"'$  (1.727 m)  Weight: 197 lb (89.359 kg)  SpO2: 98%   Gen: Pleasant, well-nourished, in no distress,  normal affect  ENT: No lesions,  Tongue is blackened with cigarette tar,  no postnasal drip  Neck: No JVD, no TMG, no carotid bruits  Lungs: No use of accessory muscles,  clear without rales or rhonchi  Cardiovascular: RRR, heart sounds normal, no murmur or gallops, no peripheral edema  Musculoskeletal: No deformities, no cyanosis or clubbing. He does have nicotine staining on his nails  Neuro: alert, non focal  Skin:  Warm, no lesions or rashes     Assessment & Plan:  COPD (chronic obstructive pulmonary disease) Fairly stable. He does have daily symptoms that are primarily cough and exertional dyspnea. He has not had any exacerbations. Continued smoking is a huge problem but he has not been ready or willing to stop. We will continue Spiriva and albuterol when necessary  Adenocarcinoma of right lung He has undergone chemotherapy and is currently receiving radiation therapy. Will follow with Dr. Sondra Come and Dr Julien Nordmann. Next CT scan of the chest planned for  April

## 2015-05-13 NOTE — Assessment & Plan Note (Addendum)
He has undergone chemotherapy and is currently receiving radiation therapy. Will follow with Dr. Sondra Come and Dr Julien Nordmann. Next CT scan of the chest planned for April

## 2015-05-13 NOTE — Patient Instructions (Signed)
Please continue Spiriva 2 puffs once a day as you have been taking it Please keep albuterol available to use 2 puffs as needed for shortness of breath Work to decrease your cigarette use. This will help both your breathing and coughing Follow with Dr. Sondra Come as planned.  Follow with Dr Lamonte Sakai in 6 months or sooner if you have any problems

## 2015-05-14 ENCOUNTER — Ambulatory Visit
Admission: RE | Admit: 2015-05-14 | Discharge: 2015-05-14 | Disposition: A | Discharge status: Home / Self Care | Payer: Medicare Other | Source: Ambulatory Visit | Attending: Radiation Oncology | Admitting: Radiation Oncology

## 2015-05-14 DIAGNOSIS — Z510 Encounter for antineoplastic radiation therapy: Secondary | ICD-10-CM | POA: Diagnosis not present

## 2015-05-17 ENCOUNTER — Ambulatory Visit
Admission: RE | Admit: 2015-05-17 | Discharge: 2015-05-17 | Disposition: A | Discharge status: Home / Self Care | Payer: Medicare Other | Source: Ambulatory Visit | Attending: Radiation Oncology | Admitting: Radiation Oncology

## 2015-05-17 ENCOUNTER — Other Ambulatory Visit (INDEPENDENT_AMBULATORY_CARE_PROVIDER_SITE_OTHER): Payer: Medicare Other | Admitting: *Deleted

## 2015-05-17 DIAGNOSIS — E785 Hyperlipidemia, unspecified: Secondary | ICD-10-CM | POA: Diagnosis not present

## 2015-05-17 DIAGNOSIS — Z510 Encounter for antineoplastic radiation therapy: Secondary | ICD-10-CM | POA: Diagnosis not present

## 2015-05-17 DIAGNOSIS — I25810 Atherosclerosis of coronary artery bypass graft(s) without angina pectoris: Secondary | ICD-10-CM

## 2015-05-17 LAB — LIPID PANEL
Cholesterol: 144 mg/dL (ref 125–200)
HDL: 49 mg/dL (ref >=40)
LDL CALC: 68 mg/dL (ref <130)
TRIGLYCERIDES: 137 mg/dL (ref <150)
Total CHOL/HDL Ratio: 2.9 Ratio (ref <=5.0)
VLDL: 27 mg/dL (ref <30)

## 2015-05-17 LAB — HEPATIC FUNCTION PANEL
ALK PHOS: 63 U/L (ref 40–115)
ALT: 10 U/L (ref 9–46)
AST: 28 U/L (ref 10–35)
Albumin: 3.8 g/dL (ref 3.6–5.1)
BILIRUBIN INDIRECT: 0.3 mg/dL (ref 0.2–1.2)
BILIRUBIN TOTAL: 0.4 mg/dL (ref 0.2–1.2)
Bilirubin, Direct: 0.1 mg/dL (ref <=0.2)
TOTAL PROTEIN: 6.4 g/dL (ref 6.1–8.1)

## 2015-05-18 ENCOUNTER — Ambulatory Visit
Admission: RE | Admit: 2015-05-18 | Discharge: 2015-05-18 | Disposition: A | Discharge status: Home / Self Care | Payer: Medicare Other | Source: Ambulatory Visit | Attending: Radiation Oncology | Admitting: Radiation Oncology

## 2015-05-18 ENCOUNTER — Encounter: Payer: Self-pay | Admitting: Radiation Oncology

## 2015-05-18 VITALS — BP 126/71 | HR 66 | Temp 97.9°F | Ht 68.0 in | Wt 199.8 lb

## 2015-05-18 DIAGNOSIS — Z510 Encounter for antineoplastic radiation therapy: Secondary | ICD-10-CM | POA: Diagnosis not present

## 2015-05-18 DIAGNOSIS — C3491 Malignant neoplasm of unspecified part of right bronchus or lung: Secondary | ICD-10-CM

## 2015-05-18 NOTE — Progress Notes (Signed)
Ishmael Ullom has completed 25 Fractions to his lung.  He denies pain and increase in shortness of breath.  He continues to have a productive cough with clear sputum.  He reports having have a sore throat and feels like food is not going down.  He reports having fatigue.  The skin on his chest and upper back is slightly red.  He does use radiaplex.  BP 126/71 mmHg  Pulse 66  Temp(Src) 97.9 F (36.6 C) (Oral)  Ht '5\' 8"'$  (1.727 m)  Wt 199 lb 12.8 oz (90.629 kg)  BMI 30.39 kg/m2  SpO2 98%   Wt Readings from Last 3 Encounters:  05/18/15 199 lb 12.8 oz (90.629 kg)  05/13/15 197 lb (89.359 kg)  05/11/15 199 lb (90.266 kg)

## 2015-05-18 NOTE — Progress Notes (Signed)
  Radiation Oncology         (336) [phone] ________________________________  Name: Steve Baker MRN: [id number]  Date: 05/18/2015  DOB: [birth year]  Weekly Radiation Therapy Management    ICD-9-CM ICD-10-CM   1. Adenocarcinoma of right lung (HCC) 162.9 C34.91     Current Dose: 45 Gy     Planned Dose:  63 Gy  Narrative . . . . . . . . The patient presents for routine under treatment assessment.                                   The patient Having some mild esophageal symptoms but does not wish to take medication for this issue. Occasionally will  food getting hung up for a few seconds but is able to swallow without emesis. Weight is stable                                 Set-up films were reviewed.                                 The chart was checked. Physical Findings. . .  height is '5\' 8"'$  (1.727 m) and weight is 199 lb 12.8 oz (90.629 kg). His oral temperature is 97.9 F (36.6 C). His blood pressure is 126/71 and his pulse is 66. His oxygen saturation is 98%. . The lungs are clear. The heart has a regular rhythm and rate. Small approximately 2-3 mm papule along the left tonsillar region unchanged since last evaluation. Impression . . . . . . . The patient is tolerating radiation. Plan . . . . . . . . . . . . Continue treatment as planned.  ________________________________   Blair Promise, PhD, MD

## 2015-05-19 ENCOUNTER — Encounter: Payer: Self-pay | Admitting: Cardiology

## 2015-05-19 ENCOUNTER — Encounter: Payer: Self-pay | Admitting: Radiation Oncology

## 2015-05-19 ENCOUNTER — Ambulatory Visit
Admission: RE | Admit: 2015-05-19 | Discharge: 2015-05-19 | Disposition: A | Discharge status: Home / Self Care | Payer: Medicare Other | Source: Ambulatory Visit | Attending: Radiation Oncology | Admitting: Radiation Oncology

## 2015-05-19 DIAGNOSIS — C3491 Malignant neoplasm of unspecified part of right bronchus or lung: Secondary | ICD-10-CM

## 2015-05-19 DIAGNOSIS — Z510 Encounter for antineoplastic radiation therapy: Secondary | ICD-10-CM | POA: Diagnosis not present

## 2015-05-19 NOTE — Progress Notes (Signed)
Cardiology Office Note   Date:  05/20/2015   ID:  Steve Baker, DOB [birth year], MRN [id number]  PCP:  Leonard Downing, MD    Chief Complaint  Patient presents with  . Coronary Artery Disease    no sx  . Hyperlipidemia  . Hypertension      History of Present Illness: Steve Baker is a 75 y.o. 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 Fletcher Anon, prostate CA (2001), hyperlipidemia and stage IV non-small cell lung CA. He had a cath done 07/2014 due to abnormal nuclear stress test done for pre op workup for lung surgery and was found to have severe 3 vessel ASCAD with 2/4 grafts patent and has been on medical therapy. He is followed by Dr. Fletcher Anon due to severe claudication and has a history of left profundus femoral endarterectomy and patch angioplasty and left femoral to above the knee popliteal bypass by Dr. Donnetta Hutching. He has had complete resolution of the leg pain since peripheral revascularization. He denies any chest pain or pressure. He has chronic DOE that is stable. He denies any  palpitations or syncope.  He occasionally has some mild ankle edema in the right leg.     Past Medical History  Diagnosis Date  . Hyperlipidemia   . Shortness of breath dyspnea     with ambulation  . Peripheral vascular disease ([place])     s/p aortobifemoral bypass and left profundus femoral endarterectomy with patch angioplasty and left femoral to above the knee popliteal bypass  . Anxiety   . Hypertension     lisinopril  . Dysrhythmia   . COPD (chronic obstructive pulmonary disease) ([place])   . History of bronchitis   . Anemia   . Prostate cancer ([place]) 2001    seeds  . 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 Lung cancer ([place])     adenocarcinoma of right lung  . Full code status 12/23/2014  . Hyperlipidemia LDL goal <70 02/18/2015  . DCM  (dilated cardiomyopathy) ([place]) 02/18/2015  . Coronary artery disease     s/p CABG now with 2/4 grafts patent by cath 2016 - medical management    Past Surgical History  Procedure Laterality Date  . Cholecystectomy    . Heart bypass    . Cardiac catheterization  2001  . Skin cancer excision N/A     Nose  . Abdominal aortic aneurysm repair    . Eye surgery Bilateral     cataract removal  . Colonoscopy w/ polypectomy    . Cardiac catheterization N/A 07/16/2014    Procedure: Right/Left Heart Cath and Coronary Angiography;  Surgeon: Burnell Blanks, MD;  Location: [facility];  Service: Cardiovascular;  Laterality: N/A;  . Coronary artery bypass graft    . Video bronchoscopy with endobronchial navigation N/A 07/29/2014    Procedure: VIDEO BRONCHOSCOPY WITH ENDOBRONCHIAL NAVIGATION;  Surgeon: Collene Gobble, MD;  Location: [facility];  Service: Thoracic;  Laterality: N/A;  . Endarterectomy femoral Left 08/19/2014    Procedure: PROFUNDA FEMORIS ENDARTERECTOMY ;  Surgeon: Rosetta Posner, MD;  Location: [facility];  Service: Vascular;  Laterality: Left;  . Femoral-popliteal bypass graft Left 08/19/2014    Procedure: FEMORAL-POPLITEAL ARTERY BYPASS GRAFT WITH PROPATEN GORTEX ;  Surgeon: Rosetta Posner, MD;  Location: [facility];  Service: Vascular;  Laterality: Left;  . Patch angioplasty Left 08/19/2014    Procedure: LEFT PROFUNDA FEMORIS PATCH ANGIOPLASTY USING HEMASHIELD PLATINUM FINESSE PATCH;  Surgeon: Rosetta Posner, MD;  Location: [facility];  Service: Vascular;  Laterality: Left;  . Peripheral vascular catheterization N/A 08/05/2014    Procedure: Abdominal Aortogram w/Lower Extremity;  Surgeon: Wellington Hampshire, MD;  Location: [facility];  Service: Cardiovascular;  Laterality: N/A;     Current Outpatient Prescriptions  Medication Sig Dispense Refill  . aspirin EC 81 MG tablet Take 81 mg by mouth daily.    . carvedilol (COREG) 3.125 MG tablet Take 1 tablet (3.125 mg total) by mouth 2 (two) times  daily with a meal. 60 tablet 11  . hyaluronate sodium (RADIAPLEXRX) GEL Apply 1 application topically 2 (two) times daily. Reported on 04/27/2015    . rosuvastatin (CRESTOR) 20 MG tablet Take 1 tablet (20 mg total) by mouth daily. 30 tablet 11  . SPIRIVA RESPIMAT 2.5 MCG/ACT AERS INHALE TWO SPRAY(S) BY MOUTH ONCE DAILY 1 Inhaler 5  . spironolactone (ALDACTONE) 25 MG tablet Take 1 tablet (25 mg total) by mouth daily. 30 tablet 11   No current facility-administered medications for this visit.    Allergies:   Review of patient's allergies indicates no known allergies.    Social History:  The patient  reports that he has been smoking Cigarettes.  He has a 142.5 pack-year smoking history. He has never used smokeless tobacco. He reports that he drinks about 1.8 oz of alcohol per week. He reports that he does not use illicit drugs.   Family History:  The patient's family history includes Colon cancer in his sister; Heart Problems in his mother; Heart attack in his father. There is no history of Stroke.    ROS:  Please see the history of present illness.   Otherwise, review of systems are positive for none.   All other systems are reviewed and negative.    PHYSICAL EXAM: VS:  BP 140/74 mmHg  Pulse 65  Ht '5\' 8"'$  (1.727 m)  Wt 199 lb 12.8 oz (90.629 kg)  BMI 30.39 kg/m2  SpO2 95% , BMI Body mass index is 30.39 kg/(m^2). GEN: Well nourished, well developed, in no acute distress HEENT: normal Neck: no JVD, carotid bruits, or masses Cardiac: RRR; no murmurs, rubs, or gallops,no edema.  Occasional ectopy Respiratory:  clear to auscultation bilaterally, normal work of breathing GI: soft, nontender, nondistended, + BS MS: no deformity or atrophy Skin: warm and dry, no rash Neuro:  Strength and sensation are intact Psych: euthymic mood, full affect   EKG:  EKG was ordered today and showed NSR at 62bpm with  Sinus arrhythmia with inferior infarct and anterior infarct with lateral ST/T wave  abnormality    Recent Labs: 03/11/2015: HGB 12.5*; Platelets 143 03/25/2015: BUN 17; Creat 1.23*; Potassium 4.9; Sodium 137 05/17/2015: ALT 10    Lipid Panel    Component Value Date/Time   CHOL 144 05/17/2015 1136   TRIG 137 05/17/2015 1136   HDL 49 05/17/2015 1136   CHOLHDL 2.9 05/17/2015 1136   VLDL 27 05/17/2015 1136   LDLCALC 68 05/17/2015 1136      Wt Readings from Last 3 Encounters:  05/20/15 199 lb 12.8 oz (90.629 kg)  05/18/15 199 lb 12.8 oz (90.629 kg)  05/13/15 197 lb (89.359 kg)     ASSESSMENT AND PLAN:  Coronary artery disease involving native coronary artery of native heart  without angina pectoris - s/p cath with 2/4 grafts patent on medical management. EKG shows new T wave inversions in the inferior leads and V4 but he has no agina at this time. Continue ASA/statin/BB  Ischemic Cardiomyopathy EF 24% by cath and nuclear stress test and now increased to 35-40% by echo on medical therapy. Continue Coreg/Aldactone. Check BMET today.  He got confused when he got a call about stopping pravastatin and starting Crestor.  He thought he was supposed to stop Lisinopril so he is not on it.  I have asked him to restart Lisinopril at '40mg'$  daily.  Recheck BMET in 1 week.    Essential hypertension BP controlled on current regimen.  Hyperlipidemia Continue statin. LDL at goal this month at 68.  Tobacco abuse He is working on quitting.  Lung mass S/p surgical resection.  Chronic obstructive pulmonary disease, unspecified COPD, unspecified chronic bronchitis type Continue Spiriva.  Edema  Chronic. He takes Lasix prn.  PVD with remote AAA s/p prior aortobifemoral bypass. Now s/p left profundus femoral endarterectomy and patch angioplasty and left femoral to above the knee popliteal bypass with resolution of claudication.    Current medicines are reviewed at length with the patient today.  The patient does not have concerns regarding medicines.  The following  changes have been made:  no change  Labs/ tests ordered today: See above Assessment and Plan No orders of the defined types were placed in this encounter.     Disposition:   FU with me in 6 months  Signed, Sueanne Margarita, MD  05/20/2015 10:38 AM    [facility] [place], [place], [place]  [address] Phone: [phone]; Fax: [phone]

## 2015-05-19 NOTE — Progress Notes (Signed)
  Radiation Oncology         (336) [phone] ________________________________  Name: Steve Baker MRN: [id number]  Date: 05/19/2015  DOB: 1941-01-18  SIMULATION NOTE    ICD-9-CM ICD-10-CM   1. Adenocarcinoma of right lung (HCC) 162.9 C34.91     DIAGNOSIS:  Stage III non-small cell lung cancer  NARRATIVE:  The patient CT scan was reviewed .  Identity was confirmed.  All relevant records and images related to the planned course of therapy were reviewed.  The patient freely provided informed written consent to proceed with treatment after reviewing the details related to the planned course of therapy. The consent form was witnessed and verified by the simulation staff.  Then, the patient was set-up in a stable reproducible  supine position for radiation therapy.  CT images were obtained.  Surface markings were placed.  The CT images were loaded into the planning software.  Then the target and avoidance structures were contoured.  Treatment planning then occurred.  The radiation prescription was entered and confirmed.  Then, I designed and supervised the construction of a total of 3 medically necessary complex treatment devices.  I have requested : 3D Simulation  I have requested a DVH of the following structures: GTV, PTV  Lungs, esophagus and spinal cord, heart.  I have ordered:dose calc.  PLAN:  The patient will receive 12.6 Gy in 7 fractions with this boost field for a cumulative dose of 63 gray.  ________________________________  -----------------------------------  Blair Promise, PhD, MD

## 2015-05-20 ENCOUNTER — Telehealth: Payer: Self-pay | Admitting: *Deleted

## 2015-05-20 ENCOUNTER — Ambulatory Visit
Admission: RE | Admit: 2015-05-20 | Discharge: 2015-05-20 | Disposition: A | Discharge status: Home / Self Care | Payer: Medicare Other | Source: Ambulatory Visit | Attending: Radiation Oncology | Admitting: Radiation Oncology

## 2015-05-20 ENCOUNTER — Encounter: Payer: Self-pay | Admitting: Cardiology

## 2015-05-20 ENCOUNTER — Ambulatory Visit (INDEPENDENT_AMBULATORY_CARE_PROVIDER_SITE_OTHER): Payer: Medicare Other | Admitting: Cardiology

## 2015-05-20 VITALS — BP 140/74 | HR 65 | Ht 68.0 in | Wt 199.8 lb

## 2015-05-20 DIAGNOSIS — I5022 Chronic systolic (congestive) heart failure: Secondary | ICD-10-CM | POA: Diagnosis not present

## 2015-05-20 DIAGNOSIS — I2510 Atherosclerotic heart disease of native coronary artery without angina pectoris: Secondary | ICD-10-CM

## 2015-05-20 DIAGNOSIS — I255 Ischemic cardiomyopathy: Secondary | ICD-10-CM | POA: Diagnosis not present

## 2015-05-20 DIAGNOSIS — Z510 Encounter for antineoplastic radiation therapy: Secondary | ICD-10-CM | POA: Diagnosis not present

## 2015-05-20 DIAGNOSIS — I10 Essential (primary) hypertension: Secondary | ICD-10-CM | POA: Diagnosis not present

## 2015-05-20 DIAGNOSIS — I739 Peripheral vascular disease, unspecified: Secondary | ICD-10-CM

## 2015-05-20 DIAGNOSIS — I2583 Coronary atherosclerosis due to lipid rich plaque: Principal | ICD-10-CM

## 2015-05-20 DIAGNOSIS — E785 Hyperlipidemia, unspecified: Secondary | ICD-10-CM

## 2015-05-20 NOTE — Telephone Encounter (Signed)
I s/w wife who said pt was driving and that I could s/w her. I asked if pt could hear me ok, she said yes. Need DPR filled out. I went over results since pt was in the car.

## 2015-05-20 NOTE — Patient Instructions (Signed)
Medication Instructions:  Your physician has recommended you make the following change in your medication:  1) START LISINOPRIL 40 mg daily  Labwork: Your physician recommends that you return for lab work. (BMET)  Testing/Procedures: None  Follow-Up: Your physician wants you to follow-up in: 6 months with Dr. Radford Pax. You will receive a reminder letter in the mail two months in advance. If you don't receive a letter, please call our office to schedule the follow-up appointment.   Any Other Special Instructions Will Be Listed Below (If Applicable).     If you need a refill on your cardiac medications before your next appointment, please call your pharmacy.

## 2015-05-21 ENCOUNTER — Telehealth: Payer: Self-pay | Admitting: Cardiology

## 2015-05-21 ENCOUNTER — Ambulatory Visit
Admission: RE | Admit: 2015-05-21 | Discharge: 2015-05-21 | Disposition: A | Discharge status: Home / Self Care | Payer: Medicare Other | Source: Ambulatory Visit | Attending: Radiation Oncology | Admitting: Radiation Oncology

## 2015-05-21 DIAGNOSIS — Z510 Encounter for antineoplastic radiation therapy: Secondary | ICD-10-CM | POA: Diagnosis not present

## 2015-05-21 MED ORDER — LISINOPRIL 40 MG PO TABS: 40.0000 mg | ORAL_TABLET | Freq: Every day | ORAL | Status: AC

## 2015-05-21 NOTE — Telephone Encounter (Signed)
Lisinopril refill sent. Patient's wife notified.

## 2015-05-21 NOTE — Telephone Encounter (Signed)
NEW MESSAGE   Pt wife wants Rn to know pt is not taking PRAVASTATIN and  Pt needs a new prescription for  LISINOPRIL

## 2015-05-24 ENCOUNTER — Ambulatory Visit
Admission: RE | Admit: 2015-05-24 | Discharge: 2015-05-24 | Disposition: A | Discharge status: Home / Self Care | Payer: Medicare Other | Source: Ambulatory Visit | Attending: Radiation Oncology | Admitting: Radiation Oncology

## 2015-05-24 DIAGNOSIS — Z510 Encounter for antineoplastic radiation therapy: Secondary | ICD-10-CM | POA: Diagnosis not present

## 2015-05-25 ENCOUNTER — Ambulatory Visit: Payer: Medicare Other

## 2015-05-25 ENCOUNTER — Ambulatory Visit
Admission: RE | Admit: 2015-05-25 | Discharge: 2015-05-25 | Disposition: A | Discharge status: Home / Self Care | Payer: Medicare Other | Source: Ambulatory Visit | Attending: Radiation Oncology | Admitting: Radiation Oncology

## 2015-05-25 ENCOUNTER — Encounter: Payer: Self-pay | Admitting: Radiation Oncology

## 2015-05-25 VITALS — BP 141/58 | HR 72 | Temp 98.0°F | Resp 18 | Wt 198.8 lb

## 2015-05-25 DIAGNOSIS — Z510 Encounter for antineoplastic radiation therapy: Secondary | ICD-10-CM | POA: Diagnosis not present

## 2015-05-25 DIAGNOSIS — C3491 Malignant neoplasm of unspecified part of right bronchus or lung: Secondary | ICD-10-CM

## 2015-05-25 NOTE — Progress Notes (Signed)
  Radiation Oncology         [phone]) [phone] ________________________________  Name: Steve Baker MRN: [id number]  Date: 05/25/2015  DOB: [birth year]  Weekly Radiation Therapy Management    ICD-9-CM ICD-10-CM   1. Adenocarcinoma of right lung (HCC) 162.9 C34.91      Current Dose: 54 Gy     Planned Dose:  63 Gy  Narrative . . . . . . . . The patient presents for routine under treatment assessment.                                  The patient has noticed more problems with dizziness. He notices this more when 1 from lying to sitting position and one from sitting to standing position. Recommending force fluids. Patient is due for a MRI of the brain recommended by the [facility] team. We'll order this today. The patient is scheduled for chemistries through Dr. Theodosia Blender office later this week to check BUN/ creatinine and renal function.                                 Set-up films were reviewed.                                 The chart was checked. Physical Findings. . .  weight is 198 lb 12.8 oz (90.175 kg). His oral temperature is 98 F (36.7 C). His blood pressure is 141/58 and his pulse is 72. His respiration is 18 and oxygen saturation is 94%. . The lungs are clear. The heart has a regular rhythm and rate.  Impression . . . . . . . The patient is tolerating radiation. Plan . . . . . . . . . . . . Continue treatment as planned. Brain MRI  ________________________________   Blair Promise, PhD, MD

## 2015-05-25 NOTE — Progress Notes (Signed)
Weight and vitals stable. Denies pain. Reports a productive cough with clear sputum. Denies hemoptysis. Reports SOB with exertion. Reports occasional difficulty swallowing but, denies this is ever painful. Denies skin changes within treatment field. Reports new onset dizziness and nausea. Denies headache, diplopia or ringing in the ears. Answers all questions quickly and appropriately. Reports difficulty remaining asleep and that he can only sleep for two hours at a time.   BP 141/58 mmHg  Pulse 72  Temp(Src) 98 F (36.7 C) (Oral)  Resp 18  Wt 198 lb 12.8 oz (90.175 kg)  SpO2 94% Wt Readings from Last 3 Encounters:  05/25/15 198 lb 12.8 oz (90.175 kg)  05/20/15 199 lb 12.8 oz (90.629 kg)  05/18/15 199 lb 12.8 oz (90.629 kg)

## 2015-05-26 ENCOUNTER — Ambulatory Visit
Admission: RE | Admit: 2015-05-26 | Discharge: 2015-05-26 | Disposition: A | Discharge status: Home / Self Care | Payer: Medicare Other | Source: Ambulatory Visit | Attending: Radiation Oncology | Admitting: Radiation Oncology

## 2015-05-26 DIAGNOSIS — Z510 Encounter for antineoplastic radiation therapy: Secondary | ICD-10-CM | POA: Diagnosis not present

## 2015-05-27 ENCOUNTER — Other Ambulatory Visit: Payer: Medicare Other | Admitting: *Deleted

## 2015-05-27 ENCOUNTER — Ambulatory Visit
Admission: RE | Admit: 2015-05-27 | Discharge: 2015-05-27 | Disposition: A | Discharge status: Home / Self Care | Payer: Medicare Other | Source: Ambulatory Visit | Attending: Radiation Oncology | Admitting: Radiation Oncology

## 2015-05-27 DIAGNOSIS — I5022 Chronic systolic (congestive) heart failure: Secondary | ICD-10-CM

## 2015-05-27 DIAGNOSIS — I10 Essential (primary) hypertension: Secondary | ICD-10-CM

## 2015-05-27 DIAGNOSIS — Z510 Encounter for antineoplastic radiation therapy: Secondary | ICD-10-CM | POA: Diagnosis not present

## 2015-05-27 LAB — BASIC METABOLIC PANEL
BUN: 22 mg/dL (ref 7–25)
CO2: 25 mmol/L (ref 20–31)
Calcium: 8.8 mg/dL (ref 8.6–10.3)
Chloride: 105 mmol/L (ref 98–110)
Creat: 1.36 mg/dL — ABNORMAL HIGH (ref 0.70–1.18)
GLUCOSE: 75 mg/dL (ref 65–99)
POTASSIUM: 4.3 mmol/L (ref 3.5–5.3)
SODIUM: 137 mmol/L (ref 135–146)

## 2015-05-28 ENCOUNTER — Ambulatory Visit
Admission: RE | Admit: 2015-05-28 | Discharge: 2015-05-28 | Disposition: A | Discharge status: Home / Self Care | Payer: Medicare Other | Source: Ambulatory Visit | Attending: Radiation Oncology | Admitting: Radiation Oncology

## 2015-05-28 DIAGNOSIS — Z510 Encounter for antineoplastic radiation therapy: Secondary | ICD-10-CM | POA: Diagnosis not present

## 2015-05-31 ENCOUNTER — Ambulatory Visit
Admission: RE | Admit: 2015-05-31 | Discharge: 2015-05-31 | Disposition: A | Discharge status: Home / Self Care | Payer: Medicare Other | Source: Ambulatory Visit | Attending: Radiation Oncology | Admitting: Radiation Oncology

## 2015-05-31 ENCOUNTER — Ambulatory Visit: Payer: Medicare Other

## 2015-06-01 ENCOUNTER — Ambulatory Visit: Payer: Medicare Other

## 2015-06-01 ENCOUNTER — Ambulatory Visit: Payer: Medicare Other | Admitting: Radiation Oncology

## 2015-06-02 ENCOUNTER — Ambulatory Visit: Payer: Medicare Other

## 2015-06-02 ENCOUNTER — Ambulatory Visit: Admission: RE | Admit: 2015-06-02 | Payer: Medicare Other | Source: Ambulatory Visit | Admitting: Radiation Oncology

## 2015-06-02 ENCOUNTER — Ambulatory Visit
Admission: RE | Admit: 2015-06-02 | Discharge: 2015-06-02 | Disposition: A | Discharge status: Home / Self Care | Payer: Medicare Other | Source: Ambulatory Visit | Attending: Radiation Oncology | Admitting: Radiation Oncology

## 2015-06-02 DIAGNOSIS — Z510 Encounter for antineoplastic radiation therapy: Secondary | ICD-10-CM | POA: Diagnosis not present

## 2015-06-03 ENCOUNTER — Ambulatory Visit
Admission: RE | Admit: 2015-06-03 | Discharge: 2015-06-03 | Disposition: A | Discharge status: Home / Self Care | Payer: Medicare Other | Source: Ambulatory Visit | Attending: Radiation Oncology | Admitting: Radiation Oncology

## 2015-06-03 ENCOUNTER — Encounter: Payer: Self-pay | Admitting: Radiation Oncology

## 2015-06-03 VITALS — BP 97/77 | HR 59 | Temp 97.6°F | Ht 68.0 in | Wt 200.6 lb

## 2015-06-03 DIAGNOSIS — Z510 Encounter for antineoplastic radiation therapy: Secondary | ICD-10-CM | POA: Diagnosis not present

## 2015-06-03 DIAGNOSIS — C3491 Malignant neoplasm of unspecified part of right bronchus or lung: Secondary | ICD-10-CM

## 2015-06-03 NOTE — Progress Notes (Signed)
  Radiation Oncology         (336) [phone] ________________________________  Name: Steve Baker MRN: [id number]  Date: 06/03/2015  DOB: 09-19-1940  Weekly Radiation Therapy Management    ICD-9-CM ICD-10-CM   1. Adenocarcinoma of right lung (HCC) 162.9 C34.91      Current Dose: 63 Gy     Planned Dose:  63 Gy  Narrative . . . . . . . . The patient presents for routine under treatment assessment.                                  Steve Baker presents for his last fraction of radiation to his lung. He denies pain or fatigue at this time. He also denies any shortness of breath and reports he is eating well. He states the skin to his radiation site is normal in appearance, and he is using the radiaplex cream at times. He has no other concerns at this time.                                  Set-up films were reviewed.                                  The chart was checked. Physical Findings. . .  height is '5\' 8"'$  (1.727 m) and weight is 200 lb 9.6 oz (90.992 kg). His temperature is 97.6 F (36.4 C). His blood pressure is 97/77 and his pulse is 59. His oxygen saturation is 97%. . No palpable cervical, supraclavicular or axillary lymphadenopathy. The heart has a regular rate and rhythm. The lungs are clear to auscultation.  Impression . . . . . . . The patient is tolerating radiation. Plan . . . . . . . . . . . . The patient has completed radiation treatment. Follow up in 1 month.   ________________________________   Blair Promise, PhD, MD  This document serves as a record of services personally performed by Gery Pray, MD. It was created on his behalf by Derek Mound, a trained medical scribe. The creation of this record is based on the scribe's personal observations and the provider's statements to them. This document has been checked and approved by the attending provider.

## 2015-06-03 NOTE — Progress Notes (Signed)
Steve Baker presents for his last fraction of radiation to his Lung. He denies pain or fatigue at this time. He also denies any shortness of breath and reports he is eating well. He states the skin to his radiation site is normal in appearance, and he is using the radiaplex cream at times. He has no other concerns at this time.   BP 97/77 mmHg  Pulse 59  Temp(Src) 97.6 F (36.4 C)  Ht '5\' 8"'$  (1.727 m)  Wt 200 lb 9.6 oz (90.992 kg)  BMI 30.51 kg/m2  SpO2 97%   Wt Readings from Last 3 Encounters:  06/03/15 200 lb 9.6 oz (90.992 kg)  05/25/15 198 lb 12.8 oz (90.175 kg)  05/20/15 199 lb 12.8 oz (90.629 kg)

## 2015-06-07 ENCOUNTER — Telehealth: Payer: Self-pay | Admitting: *Deleted

## 2015-06-07 ENCOUNTER — Other Ambulatory Visit: Payer: Medicare Other

## 2015-06-07 DIAGNOSIS — C3491 Malignant neoplasm of unspecified part of right bronchus or lung: Secondary | ICD-10-CM

## 2015-06-07 NOTE — Telephone Encounter (Signed)
MD and CT Cest will be r/s for approx 5weeks from date of last radiation treatment. Pt to expect a call from scheduling with new appt date/times POF to scheduling

## 2015-06-08 ENCOUNTER — Ambulatory Visit
Admission: RE | Admit: 2015-06-08 | Discharge: 2015-06-08 | Disposition: A | Discharge status: Home / Self Care | Payer: Medicare Other | Source: Ambulatory Visit | Attending: Radiation Oncology | Admitting: Radiation Oncology

## 2015-06-08 DIAGNOSIS — C3491 Malignant neoplasm of unspecified part of right bronchus or lung: Secondary | ICD-10-CM

## 2015-06-08 MED ORDER — GADOBENATE DIMEGLUMINE 529 MG/ML IV SOLN
17.0000 mL | Freq: Once | INTRAVENOUS | Status: AC | PRN
  Administered 2015-06-08: 17 mL via INTRAVENOUS

## 2015-06-09 ENCOUNTER — Telehealth: Payer: Self-pay | Admitting: Oncology

## 2015-06-09 ENCOUNTER — Telehealth: Payer: Self-pay | Admitting: Internal Medicine

## 2015-06-09 NOTE — Telephone Encounter (Signed)
Called [facility] and spoke to [place].  Advised her that Yony's MRI of his brain was good per Dr. Sondra Come.  Izora Gala verbalized agreement and understanding.

## 2015-06-09 NOTE — Telephone Encounter (Signed)
s.w. pt and advised on may appts.Marland KitchenMarland KitchenMarland KitchenMarland Kitchenpt ok and aware

## 2015-06-14 ENCOUNTER — Ambulatory Visit (HOSPITAL_BASED_OUTPATIENT_CLINIC_OR_DEPARTMENT_OTHER): Payer: Medicare Other | Admitting: Internal Medicine

## 2015-06-14 DIAGNOSIS — C3491 Malignant neoplasm of unspecified part of right bronchus or lung: Secondary | ICD-10-CM

## 2015-06-16 NOTE — Progress Notes (Signed)
°  Radiation Oncology         [phone] ________________________________  Name: Steve Baker MRN: [id number]  Date: 06/03/2015  DOB: 11-23-1940  End of Treatment Note  ICD-9-CM ICD-10-CM     1. 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   No significant complaints or side effects during the course of his treatment.  Plan: The patient has completed radiation treatment. The patient will return to radiation oncology clinic for routine followup in one month. I advised them to call or return sooner if they have any questions or concerns related to their recovery or treatment.  -----------------------------------  Blair Promise, PhD, MD

## 2015-06-23 NOTE — Progress Notes (Signed)
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  Plan: The patient has completed radiation treatment. The patient will return to radiation oncology clinic for routine followup in one month. I advised them to call or return sooner if they have any questions or concerns related to their recovery or treatment.  -----------------------------------  Blair Promise, PhD, MD  This document serves as a record of services personally performed by Gery Pray, MD. It was created on his behalf by Darcus Austin, a trained medical scribe. The creation of this record is based on the scribe's personal observations and the provider's statements to them. This document has been checked and approved by the attending provider.

## 2015-06-24 NOTE — Progress Notes (Signed)
This encounter was created in error - please disregard.

## 2015-07-02 ENCOUNTER — Encounter: Payer: Self-pay | Admitting: Radiation Oncology

## 2015-07-05 ENCOUNTER — Encounter: Payer: Self-pay | Admitting: Oncology

## 2015-07-06 ENCOUNTER — Other Ambulatory Visit: Payer: Medicare Other

## 2015-07-07 ENCOUNTER — Telehealth: Payer: Self-pay | Admitting: Medical Oncology

## 2015-07-07 ENCOUNTER — Other Ambulatory Visit: Payer: Self-pay | Admitting: Medical Oncology

## 2015-07-07 NOTE — Telephone Encounter (Signed)
Pt Ct scan R/S

## 2015-07-08 ENCOUNTER — Encounter: Payer: Self-pay | Admitting: Radiation Oncology

## 2015-07-08 ENCOUNTER — Other Ambulatory Visit: Payer: Medicare Other

## 2015-07-08 ENCOUNTER — Ambulatory Visit
Admission: RE | Admit: 2015-07-08 | Discharge: 2015-07-08 | Disposition: A | Discharge status: Home / Self Care | Payer: Medicare Other | Source: Ambulatory Visit | Attending: Radiation Oncology | Admitting: Radiation Oncology

## 2015-07-08 VITALS — BP 112/55 | HR 63 | Temp 97.6°F | Ht 68.0 in | Wt 199.9 lb

## 2015-07-08 DIAGNOSIS — C3491 Malignant neoplasm of unspecified part of right bronchus or lung: Secondary | ICD-10-CM

## 2015-07-08 NOTE — Progress Notes (Signed)
  Radiation Oncology         (336) [phone] ________________________________  Name: Steve Baker MRN: [id number]  Date: 07/08/2015  DOB: [birth year]  Follow-Up Visit Note  CC: Leonard Downing, MD  Curt Bears, MD    ICD-9-CM ICD-10-CM   1. 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 Completed radiation 06/03/2015.  Indication for treatment: Progression after chemotherapy, patient initially felt to have stage IV disease with brain metastasis however on repeat brain imaging there was no evidence of brain metastasis  Narrative:  The patient returns today for routine follow-up. He denies having pain and shortness of breath. He reports having a frequent cough with clear mucus. He reports having a sore throat that "started a long time ago." He reports fatigue with activity. He continues to smoke 2 packs per day of cigarettes. He denies having any skin irritation.  ALLERGIES:  has No Known Allergies.  Meds: Current Outpatient Prescriptions  Medication Sig Dispense Refill  . aspirin EC 81 MG tablet Take 81 mg by mouth daily.    . carvedilol (COREG) 3.125 MG tablet Take 1 tablet (3.125 mg total) by mouth 2 (two) times daily with a meal. 60 tablet 11  . lisinopril (PRINIVIL,ZESTRIL) 40 MG tablet Take 1 tablet (40 mg total) by mouth daily. 90 tablet 3  . rosuvastatin (CRESTOR) 20 MG tablet Take 1 tablet (20 mg total) by mouth daily. 30 tablet 11  . SPIRIVA RESPIMAT 2.5 MCG/ACT AERS INHALE TWO SPRAY(S) BY MOUTH ONCE DAILY 1 Inhaler 5  . spironolactone (ALDACTONE) 25 MG tablet Take 1 tablet (25 mg total) by mouth daily. 30 tablet 11   No current facility-administered medications for this encounter.    Physical Findings: The patient is in no acute distress. Patient is alert and oriented.  height is '5\' 8"'$  (1.727 m) and weight is 199 lb 14.4 oz (90.674 kg). His oral temperature  is 97.6 F (36.4 C). His blood pressure is 112/55 and his pulse is 63. His oxygen saturation is 100%. .  No significant changes. The lungs are clear to auscultation. The heart has a regular rhythm and rate. The abdomen is soft and nontender with normal bowel sounds. No palpable subclavicular or axillary adenopathy.   Lab Findings: Lab Results  Component Value Date   WBC 4.3 03/11/2015   HGB 12.5* 03/11/2015   HCT 38.7 03/11/2015   MCV 100.8* 03/11/2015   PLT 143 03/11/2015    Radiographic Findings: No results found.  Impression:  The patient is recovering from the effects of radiation.  Plan:  He has a CT scan of his chest on Monday. He has an appointment with Dr. Julien Nordmann on Tuesday. I will follow up with him in three months.   ____________________________________  Blair Promise, PhD, MD    This document serves as a record of services personally performed by Gery Pray, MD. It was created on his behalf by Lendon Collar, a trained medical scribe. The creation of this record is based on the scribe's personal observations and the provider's statements to them. This document has been checked and approved by the attending provider.

## 2015-07-08 NOTE — Progress Notes (Signed)
Steve Baker here for follow up.  He denies having pain and shortness of breath.  He reports having a frequent cough with clear mucus.  He reports having a sore throat that "started a long time ago."  He reports fatigue with activity.  He continues to smoke 2 packs per day of cigarettes.  He denies having any skin irritation.  He will have a ct scan of his chest on Monday and see Dr. Julien Nordmann on Tuesday.  BP 112/55 mmHg  Pulse 63  Temp(Src) 97.6 F (36.4 C) (Oral)  Ht '5\' 8"'$  (1.727 m)  Wt 199 lb 14.4 oz (90.674 kg)  BMI 30.40 kg/m2  SpO2 100%   Wt Readings from Last 3 Encounters:  07/08/15 199 lb 14.4 oz (90.674 kg)  06/03/15 200 lb 9.6 oz (90.992 kg)  05/25/15 198 lb 12.8 oz (90.175 kg)

## 2015-07-12 ENCOUNTER — Other Ambulatory Visit (HOSPITAL_BASED_OUTPATIENT_CLINIC_OR_DEPARTMENT_OTHER): Payer: Medicare Other

## 2015-07-12 ENCOUNTER — Ambulatory Visit (HOSPITAL_COMMUNITY)
Admission: RE | Admit: 2015-07-12 | Discharge: 2015-07-12 | Disposition: A | Discharge status: Home / Self Care | Payer: Medicare Other | Source: Ambulatory Visit | Attending: Internal Medicine | Admitting: Internal Medicine

## 2015-07-12 ENCOUNTER — Encounter (HOSPITAL_COMMUNITY): Payer: Self-pay

## 2015-07-12 DIAGNOSIS — C3411 Malignant neoplasm of upper lobe, right bronchus or lung: Secondary | ICD-10-CM | POA: Diagnosis not present

## 2015-07-12 DIAGNOSIS — C3491 Malignant neoplasm of unspecified part of right bronchus or lung: Secondary | ICD-10-CM

## 2015-07-12 LAB — CBC WITH DIFFERENTIAL/PLATELET
BASO%: 0.7 % (ref 0.0–2.0)
Basophils Absolute: 0 10*3/uL (ref 0.0–0.1)
EOS ABS: 0.3 10*3/uL (ref 0.0–0.5)
EOS%: 5.8 % (ref 0.0–7.0)
HEMATOCRIT: 38.9 % (ref 38.4–49.9)
HGB: 13 g/dL (ref 13.0–17.1)
LYMPH#: 0.6 10*3/uL — AB (ref 0.9–3.3)
LYMPH%: 10 % — ABNORMAL LOW (ref 14.0–49.0)
MCH: 32.6 pg (ref 27.2–33.4)
MCHC: 33.4 g/dL (ref 32.0–36.0)
MCV: 97.7 fL (ref 79.3–98.0)
MONO#: 0.7 10*3/uL (ref 0.1–0.9)
MONO%: 12.5 % (ref 0.0–14.0)
NEUT#: 4 10*3/uL (ref 1.5–6.5)
NEUT%: 71 % (ref 39.0–75.0)
PLATELETS: 159 10*3/uL (ref 140–400)
RBC: 3.99 10*6/uL — ABNORMAL LOW (ref 4.20–5.82)
RDW: 15 % — ABNORMAL HIGH (ref 11.0–14.6)
WBC: 5.6 10*3/uL (ref 4.0–10.3)

## 2015-07-12 LAB — COMPREHENSIVE METABOLIC PANEL
ALK PHOS: 75 U/L (ref 40–150)
ALT: 18 U/L (ref 0–55)
ANION GAP: 8 meq/L (ref 3–11)
AST: 37 U/L — ABNORMAL HIGH (ref 5–34)
Albumin: 3.9 g/dL (ref 3.5–5.0)
BILIRUBIN TOTAL: 0.55 mg/dL (ref 0.20–1.20)
BUN: 14.2 mg/dL (ref 7.0–26.0)
CALCIUM: 9.4 mg/dL (ref 8.4–10.4)
CO2: 24 mEq/L (ref 22–29)
CREATININE: 1.3 mg/dL (ref 0.7–1.3)
Chloride: 104 mEq/L (ref 98–109)
EGFR: 52 mL/min/{1.73_m2} — AB (ref >90)
Glucose: 92 mg/dl (ref 70–140)
Potassium: 4.9 mEq/L (ref 3.5–5.1)
Sodium: 135 mEq/L — ABNORMAL LOW (ref 136–145)
TOTAL PROTEIN: 6.8 g/dL (ref 6.4–8.3)

## 2015-07-12 MED ORDER — IOPAMIDOL (ISOVUE-300) INJECTION 61%
75.0000 mL | Freq: Once | INTRAVENOUS | Status: AC | PRN
  Administered 2015-07-12: 75 mL via INTRAVENOUS

## 2015-07-13 ENCOUNTER — Ambulatory Visit (HOSPITAL_BASED_OUTPATIENT_CLINIC_OR_DEPARTMENT_OTHER): Payer: Medicare Other | Admitting: Internal Medicine

## 2015-07-13 ENCOUNTER — Telehealth: Payer: Self-pay | Admitting: Internal Medicine

## 2015-07-13 VITALS — BP 118/60 | HR 77 | Temp 98.4°F | Resp 18 | Ht 68.0 in | Wt 198.2 lb

## 2015-07-13 DIAGNOSIS — R5383 Other fatigue: Secondary | ICD-10-CM

## 2015-07-13 DIAGNOSIS — C3411 Malignant neoplasm of upper lobe, right bronchus or lung: Secondary | ICD-10-CM

## 2015-07-13 DIAGNOSIS — C3491 Malignant neoplasm of unspecified part of right bronchus or lung: Secondary | ICD-10-CM

## 2015-07-13 DIAGNOSIS — Z720 Tobacco use: Secondary | ICD-10-CM

## 2015-07-13 NOTE — Telephone Encounter (Signed)
Gave and printed appt sched and avs for pt for Aug °

## 2015-07-13 NOTE — Progress Notes (Signed)
[facility] Telephone:(336) [phone]   Fax:(336) [phone]  OFFICE PROGRESS NOTE  Leonard Downing, MD [address] [place] [address]   DIAGNOSIS: Adenocarcinoma of right lung  Staging form: Lung, AJCC 7th Edition  Clinical stage from 08/13/2014: Stage IV (T2a, N2, M1a) - Signed by Curt Bears, MD on 08/15/2014  PRIOR THERAPY:  1) Systemic chemotherapy carboplatin for AUC of 5 and Alimta at 500 mg/m given every 3 weeks. Status post 6 cycles. 2) palliative radiotherapy to the right upper lobe lung mass under the care of Dr. Sondra Come completed 06/01/2015.  CURRENT THERAPY: Observation.  INTERVAL HISTORY: Steve Baker 75 y.o. male returns to the clinic today for follow-up visit accompanied by his wife. The patient completed 6 cycles of systemic chemotherapy with carboplatin and Alimta and tolerating his treatment fairly well with no significant adverse effects. This was followed by a course of palliative radiotherapy to the right upper lobe lung mass under the care of Dr. Sondra Come. The patient tolerated his treatment fairly well. He denied having any significant nausea or vomiting. He has no fever or chills. He denied having any significant chest pain, shortness of breath, cough or hemoptysis. The patient denied having any significant weight loss or night sweats. Unfortunately he continues to smoke and he is not willing to quit smoking. He had repeat CT scan of the chest, abdomen and pelvis performed recently and he is here for evaluation and discussion of his scan results.  MEDICAL HISTORY: Past Medical History  Diagnosis Date  . Hyperlipidemia   . Shortness of breath dyspnea     with ambulation  . Peripheral vascular disease ([place])     s/p aortobifemoral bypass and left profundus femoral endarterectomy with patch angioplasty and left femoral to above the knee popliteal bypass  . Anxiety   . Hypertension     lisinopril  . Dysrhythmia   . COPD (chronic  obstructive pulmonary disease) ([place])   . History of bronchitis   . Anemia   . Prostate cancer ([place]) 2001    seeds  . 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 Lung cancer ([place])     adenocarcinoma of right lung  . Full code status 12/23/2014  . Hyperlipidemia LDL goal <70 02/18/2015  . DCM (dilated cardiomyopathy) ([place]) 02/18/2015  . Coronary artery disease     s/p CABG now with 2/4 grafts patent by cath 2016 - medical management  . Radiation 04/14/15-06/03/15    50.4 Gy to right lung with an additional boost of 12.6 Gy to right lung    ALLERGIES:  has No Known Allergies.  MEDICATIONS:  Current Outpatient Prescriptions  Medication Sig Dispense Refill  . aspirin EC 81 MG tablet Take 81 mg by mouth daily.    . carvedilol (COREG) 3.125 MG tablet Take 1 tablet (3.125 mg total) by mouth 2 (two) times daily with a meal. 60 tablet 11  . lisinopril (PRINIVIL,ZESTRIL) 40 MG tablet Take 1 tablet (40 mg total) by mouth daily. 90 tablet 3  . rosuvastatin (CRESTOR) 20 MG tablet Take 1 tablet (20 mg total) by mouth daily. 30 tablet 11  . SPIRIVA RESPIMAT 2.5 MCG/ACT AERS INHALE TWO SPRAY(S) BY MOUTH ONCE DAILY 1 Inhaler 5  . spironolactone (ALDACTONE) 25 MG tablet Take 1 tablet (25 mg total) by mouth daily. 30 tablet 11   No current facility-administered medications for this visit.    SURGICAL HISTORY:  Past Surgical History  Procedure Laterality  Date  . Cholecystectomy    . Heart bypass    . Cardiac catheterization  2001  . Skin cancer excision N/A     Nose  . Abdominal aortic aneurysm repair    . Eye surgery Bilateral     cataract removal  . Colonoscopy w/ polypectomy    . Cardiac catheterization N/A 07/16/2014    Procedure: Right/Left Heart Cath and Coronary Angiography;  Surgeon: Burnell Blanks, MD;  Location: [facility];  Service: Cardiovascular;  Laterality: N/A;  . Coronary artery bypass graft    . Video bronchoscopy with endobronchial navigation N/A 07/29/2014     Procedure: VIDEO BRONCHOSCOPY WITH ENDOBRONCHIAL NAVIGATION;  Surgeon: Collene Gobble, MD;  Location: [facility];  Service: Thoracic;  Laterality: N/A;  . Endarterectomy femoral Left 08/19/2014    Procedure: PROFUNDA FEMORIS ENDARTERECTOMY ;  Surgeon: Rosetta Posner, MD;  Location: [facility];  Service: Vascular;  Laterality: Left;  . Femoral-popliteal bypass graft Left 08/19/2014    Procedure: FEMORAL-POPLITEAL ARTERY BYPASS GRAFT WITH PROPATEN GORTEX ;  Surgeon: Rosetta Posner, MD;  Location: [facility];  Service: Vascular;  Laterality: Left;  . Patch angioplasty Left 08/19/2014    Procedure: LEFT PROFUNDA FEMORIS PATCH ANGIOPLASTY USING HEMASHIELD PLATINUM FINESSE PATCH;  Surgeon: Rosetta Posner, MD;  Location: [facility];  Service: Vascular;  Laterality: Left;  . Peripheral vascular catheterization N/A 08/05/2014    Procedure: Abdominal Aortogram w/Lower Extremity;  Surgeon: Wellington Hampshire, MD;  Location: [facility];  Service: Cardiovascular;  Laterality: N/A;    REVIEW OF SYSTEMS:  A comprehensive review of systems was negative except for: Respiratory: positive for cough and dyspnea on exertion   PHYSICAL EXAMINATION: General appearance: alert, cooperative and no distress Head: Normocephalic, without obvious abnormality, atraumatic Neck: no adenopathy, no JVD, supple, symmetrical, trachea midline and thyroid not enlarged, symmetric, no tenderness/mass/nodules Lymph nodes: Cervical, supraclavicular, and axillary nodes normal. Resp: clear to auscultation bilaterally Back: symmetric, no curvature. ROM normal. No CVA tenderness. Cardio: regular rate and rhythm, S1, S2 normal, no murmur, click, rub or gallop GI: soft, non-tender; bowel sounds normal; no masses,  no organomegaly Extremities: extremities normal, atraumatic, no cyanosis or edema Neurologic: Alert and oriented X 3, normal strength and tone. Normal symmetric reflexes. Normal coordination and gait  ECOG PERFORMANCE STATUS: 1 - Symptomatic but  completely ambulatory  Blood pressure 118/60, pulse 77, temperature 98.4 F (36.9 C), temperature source Oral, resp. rate 18, height '5\' 8"'$  (1.727 m), weight 198 lb 3.2 oz (89.903 kg), SpO2 98 %.  LABORATORY DATA: Lab Results  Component Value Date   WBC 5.6 07/12/2015   HGB 13.0 07/12/2015   HCT 38.9 07/12/2015   MCV 97.7 07/12/2015   PLT 159 07/12/2015      Chemistry      Component Value Date/Time   NA 135* 07/12/2015 1152   NA 137 05/27/2015 1312   K 4.9 07/12/2015 1152   K 4.3 05/27/2015 1312   CL 105 05/27/2015 1312   CO2 24 07/12/2015 1152   CO2 25 05/27/2015 1312   BUN 14.2 07/12/2015 1152   BUN 22 05/27/2015 1312   CREATININE 1.3 07/12/2015 1152   CREATININE 1.36* 05/27/2015 1312   CREATININE 0.96 08/21/2014 0312      Component Value Date/Time   CALCIUM 9.4 07/12/2015 1152   CALCIUM 8.8 05/27/2015 1312   ALKPHOS 75 07/12/2015 1152   ALKPHOS 63 05/17/2015 1136   AST 37* 07/12/2015 1152   AST 28  05/17/2015 1136   ALT 18 07/12/2015 1152   ALT 10 05/17/2015 1136   BILITOT 0.55 07/12/2015 1152   BILITOT 0.4 05/17/2015 1136       RADIOGRAPHIC STUDIES: Ct Chest W Contrast  07/12/2015  CLINICAL DATA:  Right lung cancer. EXAM: CT CHEST WITH CONTRAST TECHNIQUE: Multidetector CT imaging of the chest was performed during intravenous contrast administration. CONTRAST:  89m ISOVUE-300 IOPAMIDOL (ISOVUE-300) INJECTION 61% COMPARISON:  03/12/2015. FINDINGS: Mediastinum / Lymph Nodes: There is no axillary lymphadenopathy. 16 mm right thyroid nodule not substantially changed in the interval. No mediastinal lymphadenopathy. There is no hilar lymphadenopathy. Patient is status post CABG. No pericardial effusion. The esophagus has normal imaging features. Lungs / Pleura: Spiculated right apical lesion measured previously at 2.6 x 3.1 cm now measures 2.0 x 2.0 cm. Linear opacity in the posterior medial left lower lobe, the site of treated left lower lobe pulmonary nodule is stable  without evidence for interval progression. Calcified granuloma identified left upper lobe. No focal airspace consolidation. No pulmonary edema or pleural effusion. Upper Abdomen: No evidence for adrenal nodule or mass. There is a tiny hiatal hernia. Surgical clips adjacent to the upper abdominal aorta are compatible with known bypass graft. MSK / Soft Tissues: Bone windows reveal no worrisome lytic or sclerotic osseous lesions. Bilateral gynecomastia is evident. IMPRESSION: 1. Interval decrease in size of the right upper lobe lesion. 2. Otherwise stable exam without new or progressive disease. Linear opacity/scarring in the posterior medial left lower lobe, the site of previously treated nodule, is stable. Electronically Signed   By: EMisty StanleyM.D.   On: 07/12/2015 16:54    ASSESSMENT AND PLAN: This is a very pleasant 75years old white male with a stage IV non-small cell lung cancer status post 6 cycles of systemic chemotherapy with carboplatin and Alimta. The patient tolerated his current treatment fairly well with no significant adverse effect except for mild fatigue. This was followed by palliative radiotherapy to the right upper lobe lung mass under the care of Dr. KSondra Comeand the patient tolerated this treatment well too. The recent CT scan of the chest showed further decrease in the size of the right upper lobe lesion with no evidence of disease progression. I discussed the scan results with the patient and his wife and showed them the images of the scan. I would see him back for follow-up visit in 3 months for reevaluation with repeat CT scan of the chest. For smoke cessation, I strongly encouraged the patient to quit smoking and offered him a smoke cessation program. CODE STATUS: he is full code for now. The patient was advised to call immediately if he has any concerning symptoms in the interval. The patient voices understanding of current disease status and treatment options and is in  agreement with the current care plan.  All questions were answered. The patient knows to call the clinic with any problems, questions or concerns. We can certainly see the patient much sooner if necessary.  Disclaimer: This note was dictated with voice recognition software. Similar sounding words can inadvertently be transcribed and may not be corrected upon review.

## 2015-09-03 ENCOUNTER — Encounter: Payer: Self-pay | Admitting: Vascular Surgery

## 2015-09-14 ENCOUNTER — Ambulatory Visit: Payer: Medicare Other | Admitting: Vascular Surgery

## 2015-09-14 ENCOUNTER — Ambulatory Visit (HOSPITAL_COMMUNITY)
Admission: RE | Admit: 2015-09-14 | Discharge: 2015-09-14 | Disposition: A | Discharge status: Home / Self Care | Payer: Medicare Other | Source: Ambulatory Visit | Attending: Vascular Surgery | Admitting: Vascular Surgery

## 2015-09-14 ENCOUNTER — Ambulatory Visit (INDEPENDENT_AMBULATORY_CARE_PROVIDER_SITE_OTHER)
Admission: RE | Admit: 2015-09-14 | Discharge: 2015-09-14 | Disposition: A | Discharge status: Home / Self Care | Payer: Medicare Other | Source: Ambulatory Visit | Attending: Vascular Surgery | Admitting: Vascular Surgery

## 2015-09-14 DIAGNOSIS — E785 Hyperlipidemia, unspecified: Secondary | ICD-10-CM | POA: Diagnosis not present

## 2015-09-14 DIAGNOSIS — I739 Peripheral vascular disease, unspecified: Secondary | ICD-10-CM | POA: Diagnosis not present

## 2015-09-14 DIAGNOSIS — F419 Anxiety disorder, unspecified: Secondary | ICD-10-CM | POA: Insufficient documentation

## 2015-09-14 DIAGNOSIS — J449 Chronic obstructive pulmonary disease, unspecified: Secondary | ICD-10-CM | POA: Insufficient documentation

## 2015-09-14 DIAGNOSIS — R938 Abnormal findings on diagnostic imaging of other specified body structures: Secondary | ICD-10-CM | POA: Diagnosis not present

## 2015-09-14 DIAGNOSIS — Z48812 Encounter for surgical aftercare following surgery on the circulatory system: Secondary | ICD-10-CM | POA: Diagnosis not present

## 2015-09-14 DIAGNOSIS — R0989 Other specified symptoms and signs involving the circulatory and respiratory systems: Secondary | ICD-10-CM | POA: Diagnosis present

## 2015-09-14 DIAGNOSIS — I2510 Atherosclerotic heart disease of native coronary artery without angina pectoris: Secondary | ICD-10-CM | POA: Diagnosis not present

## 2015-09-14 DIAGNOSIS — I10 Essential (primary) hypertension: Secondary | ICD-10-CM | POA: Insufficient documentation

## 2015-10-07 ENCOUNTER — Ambulatory Visit (HOSPITAL_COMMUNITY)
Admission: RE | Admit: 2015-10-07 | Discharge: 2015-10-07 | Disposition: A | Discharge status: Home / Self Care | Payer: Medicare Other | Source: Ambulatory Visit | Attending: Internal Medicine | Admitting: Internal Medicine

## 2015-10-07 ENCOUNTER — Encounter (HOSPITAL_COMMUNITY): Payer: Self-pay

## 2015-10-07 ENCOUNTER — Encounter: Payer: Self-pay | Admitting: Vascular Surgery

## 2015-10-07 ENCOUNTER — Other Ambulatory Visit (HOSPITAL_BASED_OUTPATIENT_CLINIC_OR_DEPARTMENT_OTHER): Payer: Medicare Other

## 2015-10-07 DIAGNOSIS — C3491 Malignant neoplasm of unspecified part of right bronchus or lung: Secondary | ICD-10-CM

## 2015-10-07 DIAGNOSIS — R911 Solitary pulmonary nodule: Secondary | ICD-10-CM | POA: Diagnosis not present

## 2015-10-07 DIAGNOSIS — C3411 Malignant neoplasm of upper lobe, right bronchus or lung: Secondary | ICD-10-CM

## 2015-10-07 LAB — COMPREHENSIVE METABOLIC PANEL
ALBUMIN: 3.7 g/dL (ref 3.5–5.0)
ALK PHOS: 81 U/L (ref 40–150)
ALT: 18 U/L (ref 0–55)
AST: 38 U/L — ABNORMAL HIGH (ref 5–34)
Anion Gap: 9 mEq/L (ref 3–11)
BUN: 23.1 mg/dL (ref 7.0–26.0)
CALCIUM: 9 mg/dL (ref 8.4–10.4)
CO2: 20 mEq/L — ABNORMAL LOW (ref 22–29)
Chloride: 105 mEq/L (ref 98–109)
Creatinine: 1.4 mg/dL — ABNORMAL HIGH (ref 0.7–1.3)
EGFR: 47 mL/min/{1.73_m2} — AB (ref >90)
Glucose: 100 mg/dl (ref 70–140)
POTASSIUM: 4.8 meq/L (ref 3.5–5.1)
SODIUM: 134 meq/L — AB (ref 136–145)
Total Bilirubin: 0.33 mg/dL (ref 0.20–1.20)
Total Protein: 6.7 g/dL (ref 6.4–8.3)

## 2015-10-07 LAB — CBC WITH DIFFERENTIAL/PLATELET
BASO%: 0.5 % (ref 0.0–2.0)
BASOS ABS: 0 10*3/uL (ref 0.0–0.1)
EOS ABS: 0.3 10*3/uL (ref 0.0–0.5)
EOS%: 6.2 % (ref 0.0–7.0)
HEMATOCRIT: 36.6 % — AB (ref 38.4–49.9)
HEMOGLOBIN: 12.2 g/dL — AB (ref 13.0–17.1)
LYMPH%: 14.8 % (ref 14.0–49.0)
MCH: 32 pg (ref 27.2–33.4)
MCHC: 33.3 g/dL (ref 32.0–36.0)
MCV: 96.1 fL (ref 79.3–98.0)
MONO#: 0.6 10*3/uL (ref 0.1–0.9)
MONO%: 14 % (ref 0.0–14.0)
NEUT#: 2.7 10*3/uL (ref 1.5–6.5)
NEUT%: 64.5 % (ref 39.0–75.0)
Platelets: 145 10*3/uL (ref 140–400)
RBC: 3.81 10*6/uL — ABNORMAL LOW (ref 4.20–5.82)
RDW: 14.1 % (ref 11.0–14.6)
WBC: 4.2 10*3/uL (ref 4.0–10.3)
lymph#: 0.6 10*3/uL — ABNORMAL LOW (ref 0.9–3.3)

## 2015-10-07 MED ORDER — IOPAMIDOL (ISOVUE-300) INJECTION 61%
75.0000 mL | Freq: Once | INTRAVENOUS | Status: AC | PRN
  Administered 2015-10-07: 75 mL via INTRAVENOUS

## 2015-10-12 ENCOUNTER — Other Ambulatory Visit: Payer: Medicare Other

## 2015-10-12 ENCOUNTER — Ambulatory Visit (INDEPENDENT_AMBULATORY_CARE_PROVIDER_SITE_OTHER): Payer: Medicare Other | Admitting: Vascular Surgery

## 2015-10-12 ENCOUNTER — Encounter: Payer: Self-pay | Admitting: Vascular Surgery

## 2015-10-12 VITALS — BP 124/75 | HR 60 | Temp 98.4°F | Resp 18 | Ht 68.5 in | Wt 205.8 lb

## 2015-10-12 DIAGNOSIS — I739 Peripheral vascular disease, unspecified: Secondary | ICD-10-CM | POA: Diagnosis not present

## 2015-10-12 NOTE — Progress Notes (Signed)
Vascular and Vein Specialist of [facility]  Patient name: Steve Baker MRN: [id number] DOB: 1940-07-11 Sex: male  REASON FOR VISIT: Hollow up left femoral to above-knee popliteal bypass with vein from 08/19/2014  HPI: Steve Baker is a 75 y.o. male here today for follow-up of bypass for critical limb ischemia. He looks good today. He reports that he has completed his course of radiation and chemotherapy for lung cancer. He has a appointment later this week with the oncologist for further discussion. He is here today with his wife. He denies any claudication symptoms and has had no rest pain or tissue loss.  Past Medical History:  Diagnosis Date  . Anemia   . Anxiety   . 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 COPD (chronic obstructive pulmonary disease) ([place])   . Coronary artery disease    s/p CABG now with 2/4 grafts patent by cath 2016 - medical management  . DCM (dilated cardiomyopathy) ([place]) 02/18/2015  . Dysrhythmia   . Full code status 12/23/2014  . History of bronchitis   . Hyperlipidemia   . Hyperlipidemia LDL goal <70 02/18/2015  . Hypertension    lisinopril  . Lung cancer ([place])    adenocarcinoma of right lung  . Peripheral vascular disease ([place])    s/p aortobifemoral bypass and left profundus femoral endarterectomy with patch angioplasty and left femoral to above the knee popliteal bypass  . Prostate cancer ([place]) 2001   seeds  . Radiation 04/14/15-06/03/15   50.4 Gy to right lung with an additional boost of 12.6 Gy to right lung  . Shortness of breath dyspnea    with ambulation    Family History  Problem Relation Age of Onset  . Heart Problems Mother   . Heart attack Father   . Stroke Neg Hx   . Colon cancer Sister     SOCIAL HISTORY: Social History  Substance Use Topics  . Smoking status: Current Every Day Smoker    Packs/day: 2.50    Years: 57.00    Types: Cigarettes  . Smokeless tobacco: Never Used  . Alcohol use 1.8 oz/week    3 Cans of beer per week     Comment: daily    No Known Allergies  Current Outpatient Prescriptions  Medication Sig Dispense Refill  . aspirin EC 81 MG tablet Take 81 mg by mouth daily.    . carvedilol (COREG) 3.125 MG tablet Take 1 tablet (3.125 mg total) by mouth 2 (two) times daily with a meal. 60 tablet 11  . lisinopril (PRINIVIL,ZESTRIL) 40 MG tablet Take 1 tablet (40 mg total) by mouth daily. 90 tablet 3  . rosuvastatin (CRESTOR) 20 MG tablet Take 1 tablet (20 mg total) by mouth daily. 30 tablet 11  . SPIRIVA RESPIMAT 2.5 MCG/ACT AERS INHALE TWO SPRAY(S) BY MOUTH ONCE DAILY 1 Inhaler 5  . spironolactone (ALDACTONE) 25 MG tablet Take 1 tablet (25 mg total) by mouth daily. 30 tablet 11   No current facility-administered medications for this visit.     REVIEW OF SYSTEMS:  '[X]'$  denotes positive finding, '[ ]'$  denotes negative finding Cardiac  Comments:  Chest pain or chest pressure:    Shortness of breath upon exertion: x   Short of breath when lying flat:    Irregular heart rhythm: x       Vascular    Pain in calf, thigh, or hip brought on by ambulation: x   Pain in feet at night that wakes you up  from your sleep:     Blood clot in your veins:    Leg swelling:         Pulmonary    Oxygen at home:    Productive cough:  x   Wheezing:  x       Neurologic    Sudden weakness in arms or legs:     Sudden numbness in arms or legs:     Sudden onset of difficulty speaking or slurred speech:    Temporary loss of vision in one eye:     Problems with dizziness:         Gastrointestinal    Blood in stool:     Vomited blood:         Genitourinary    Burning when urinating:     Blood in urine:        Psychiatric    Major depression:         Hematologic    Bleeding problems:    Problems with blood clotting too easily:        Skin    Rashes or ulcers:        Constitutional    Fever or chills:      PHYSICAL EXAM: Vitals:   10/12/15 1327  BP: 124/75  Pulse: 60  Resp: 18  Temp: 98.4 F (36.9  C)  TempSrc: Oral  SpO2: 98%  Weight: 205 lb 12.8 oz (93.4 kg)  Height: 5' 8.5" (1.74 m)    GENERAL: The patient is a well-nourished male, in no acute distress. The vital signs are documented above. CARDIAC: There is a regular rate and rhythm.  VASCULAR: 2+ femoral pulses bilaterally. 2+ popliteal and 2+ left dorsalis pedis pulse PULMONARY: There is good air exchange bilaterally without wheezing or rales. MUSCULOSKELETAL: There are no major deformities or cyanosis. NEUROLOGIC: No focal weakness or paresthesias are detected. SKIN: There are no ulcers or rashes noted. PSYCHIATRIC: The patient has a normal affect.  DATA:  Reviewed his study from 09/14/2015 with the patient and his wife present. Has a normal ankle arm index on the left and graft scan reveals widely patent graft with no evidence of narrowing.  MEDICAL ISSUES: Stable status post femoropopliteal bypass from 6 2016. We'll continue his usual activities. We'll notify should he develop any signs of ischemia. Plan to see him again in one year with continued noninvasive follow-up    Vihaan Gloss [facility] [employer] [phone]

## 2015-10-13 ENCOUNTER — Ambulatory Visit (HOSPITAL_COMMUNITY): Payer: Medicare Other

## 2015-10-14 ENCOUNTER — Encounter: Payer: Self-pay | Admitting: Internal Medicine

## 2015-10-14 ENCOUNTER — Ambulatory Visit (HOSPITAL_BASED_OUTPATIENT_CLINIC_OR_DEPARTMENT_OTHER): Payer: Medicare Other | Admitting: Internal Medicine

## 2015-10-14 ENCOUNTER — Encounter: Payer: Self-pay | Admitting: Radiation Oncology

## 2015-10-14 ENCOUNTER — Other Ambulatory Visit: Payer: Medicare Other

## 2015-10-14 ENCOUNTER — Telehealth: Payer: Self-pay | Admitting: Internal Medicine

## 2015-10-14 ENCOUNTER — Ambulatory Visit
Admission: RE | Admit: 2015-10-14 | Discharge: 2015-10-14 | Disposition: A | Discharge status: Home / Self Care | Payer: Medicare Other | Source: Ambulatory Visit | Attending: Radiation Oncology | Admitting: Radiation Oncology

## 2015-10-14 VITALS — BP 135/59 | HR 60 | Temp 97.7°F | Resp 17 | Ht 68.5 in | Wt 208.0 lb

## 2015-10-14 DIAGNOSIS — C3411 Malignant neoplasm of upper lobe, right bronchus or lung: Secondary | ICD-10-CM | POA: Diagnosis not present

## 2015-10-14 DIAGNOSIS — Z720 Tobacco use: Secondary | ICD-10-CM

## 2015-10-14 DIAGNOSIS — C3491 Malignant neoplasm of unspecified part of right bronchus or lung: Secondary | ICD-10-CM

## 2015-10-14 DIAGNOSIS — Y842 Radiological procedure and radiotherapy as the cause of abnormal reaction of the patient, or of later complication, without mention of misadventure at the time of the procedure: Secondary | ICD-10-CM | POA: Insufficient documentation

## 2015-10-14 NOTE — Patient Instructions (Signed)
Smoking Cessation, Tips for Success If you are ready to quit smoking, congratulations! You have chosen to help yourself be healthier. Cigarettes bring nicotine, tar, carbon monoxide, and other irritants into your body. Your lungs, heart, and blood vessels will be able to work better without these poisons. There are many different ways to quit smoking. Nicotine gum, nicotine patches, a nicotine inhaler, or nicotine nasal spray can help with physical craving. Hypnosis, support groups, and medicines help break the habit of smoking. WHAT THINGS CAN I DO TO MAKE QUITTING EASIER?  Here are some tips to help you quit for good:  Pick a date when you will quit smoking completely. Tell all of your friends and family about your plan to quit on that date.  Do not try to slowly cut down on the number of cigarettes you are smoking. Pick a quit date and quit smoking completely starting on that day.  Throw away all cigarettes.   Clean and remove all ashtrays from your home, work, and car.  On a card, write down your reasons for quitting. Carry the card with you and read it when you get the urge to smoke.  Cleanse your body of nicotine. Drink enough water and fluids to keep your urine clear or pale yellow. Do this after quitting to flush the nicotine from your body.  Learn to predict your moods. Do not let a bad situation be your excuse to have a cigarette. Some situations in your life might tempt you into wanting a cigarette.  Never have "just one" cigarette. It leads to wanting another and another. Remind yourself of your decision to quit.  Change habits associated with smoking. If you smoked while driving or when feeling stressed, try other activities to replace smoking. Stand up when drinking your coffee. Brush your teeth after eating. Sit in a different chair when you read the paper. Avoid alcohol while trying to quit, and try to drink fewer caffeinated beverages. Alcohol and caffeine may urge you to  smoke.  Avoid foods and drinks that can trigger a desire to smoke, such as sugary or spicy foods and alcohol.  Ask people who smoke not to smoke around you.  Have something planned to do right after eating or having a cup of coffee. For example, plan to take a walk or exercise.  Try a relaxation exercise to calm you down and decrease your stress. Remember, you may be tense and nervous for the first 2 weeks after you quit, but this will pass.  Find new activities to keep your hands busy. Play with a pen, coin, or rubber band. Doodle or draw things on paper.  Brush your teeth right after eating. This will help cut down on the craving for the taste of tobacco after meals. You can also try mouthwash.   Use oral substitutes in place of cigarettes. Try using lemon drops, carrots, cinnamon sticks, or chewing gum. Keep them handy so they are available when you have the urge to smoke.  When you have the urge to smoke, try deep breathing.  Designate your home as a nonsmoking area.  If you are a heavy smoker, ask your health care provider about a prescription for nicotine chewing gum. It can ease your withdrawal from nicotine.  Reward yourself. Set aside the cigarette money you save and buy yourself something nice.  Look for support from others. Join a support group or smoking cessation program. Ask someone at home or at work to help you with your plan   to quit smoking.  Always ask yourself, "Do I need this cigarette or is this just a reflex?" Tell yourself, "Today, I choose not to smoke," or "I do not want to smoke." You are reminding yourself of your decision to quit.  Do not replace cigarette smoking with electronic cigarettes (commonly called e-cigarettes). The safety of e-cigarettes is unknown, and some may contain harmful chemicals.  If you relapse, do not give up! Plan ahead and think about what you will do the next time you get the urge to smoke. HOW WILL I FEEL WHEN I QUIT SMOKING? You  may have symptoms of withdrawal because your body is used to nicotine (the addictive substance in cigarettes). You may crave cigarettes, be irritable, feel very hungry, cough often, get headaches, or have difficulty concentrating. The withdrawal symptoms are only temporary. They are strongest when you first quit but will go away within 10-14 days. When withdrawal symptoms occur, stay in control. Think about your reasons for quitting. Remind yourself that these are signs that your body is healing and getting used to being without cigarettes. Remember that withdrawal symptoms are easier to treat than the major diseases that smoking can cause.  Even after the withdrawal is over, expect periodic urges to smoke. However, these cravings are generally short lived and will go away whether you smoke or not. Do not smoke! WHAT RESOURCES ARE AVAILABLE TO HELP ME QUIT SMOKING? Your health care provider can direct you to community resources or hospitals for support, which may include:  Group support.  Education.  Hypnosis.  Therapy.   This information is not intended to replace advice given to you by your health care provider. Make sure you discuss any questions you have with your health care provider.   Document Released: 11/19/2003 Document Revised: 03/13/2014 Document Reviewed: 08/08/2012 Elsevier Interactive Patient Education 2016 [employer]  

## 2015-10-14 NOTE — Progress Notes (Signed)
[facility] [phone]   [phone]  OFFICE PROGRESS NOTE  Leonard Downing, MD [address] [place] [address]   DIAGNOSIS: Adenocarcinoma of right lung  Staging form: Lung, AJCC 7th Edition  Clinical stage from 08/13/2014: Stage IV (T2a, N2, M1a) - Signed by Curt Bears, MD on 08/15/2014  PRIOR THERAPY:  1) Systemic chemotherapy carboplatin for AUC of 5 and Alimta at 500 mg/m given every 3 weeks. Status post 6 cycles. 2) palliative radiotherapy to the right upper lobe lung mass under the care of Dr. Sondra Come completed 06/01/2015.  CURRENT THERAPY: Observation.  INTERVAL HISTORY: Steve Baker 75 y.o. male returns to the clinic today for follow-up visit accompanied by his wife. The patient is doing fine today was no specific complaints except for mild cough and shortness breath with exertion. He has been observation for the last 6 months and has been doing well with no significant complaints. He denied having any fever or chills. He has no nausea or vomiting. The patient denied having any significant weight loss or night sweats. He actually gained 10 pounds since his last visit. He had repeat CT scan of the chest performed recently and he is here for evaluation and discussion of his scan results.  MEDICAL HISTORY: Past Medical History:  Diagnosis Date  . Anemia   . Anxiety   . 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 COPD (chronic obstructive pulmonary disease) ([place])   . Coronary artery disease    s/p CABG now with 2/4 grafts patent by cath 2016 - medical management  . DCM (dilated cardiomyopathy) ([place]) 02/18/2015  . Dysrhythmia   . Full code status 12/23/2014  . History of bronchitis   . Hyperlipidemia   . Hyperlipidemia LDL goal <70 02/18/2015  . Hypertension    lisinopril  . Lung cancer ([place])    adenocarcinoma of right lung  . Peripheral vascular disease ([place])    s/p aortobifemoral bypass and left profundus  femoral endarterectomy with patch angioplasty and left femoral to above the knee popliteal bypass  . Prostate cancer ([place]) 2001   seeds  . Radiation 04/14/15-06/03/15   50.4 Gy to right lung with an additional boost of 12.6 Gy to right lung  . Shortness of breath dyspnea    with ambulation    ALLERGIES:  has No Known Allergies.  MEDICATIONS:  Current Outpatient Prescriptions  Medication Sig Dispense Refill  . aspirin EC 81 MG tablet Take 81 mg by mouth daily.    . carvedilol (COREG) 3.125 MG tablet Take 1 tablet (3.125 mg total) by mouth 2 (two) times daily with a meal. 60 tablet 11  . lisinopril (PRINIVIL,ZESTRIL) 40 MG tablet Take 1 tablet (40 mg total) by mouth daily. 90 tablet 3  . rosuvastatin (CRESTOR) 20 MG tablet Take 1 tablet (20 mg total) by mouth daily. 30 tablet 11  . SPIRIVA RESPIMAT 2.5 MCG/ACT AERS INHALE TWO SPRAY(S) BY MOUTH ONCE DAILY 1 Inhaler 5  . spironolactone (ALDACTONE) 25 MG tablet Take 1 tablet (25 mg total) by mouth daily. 30 tablet 11   No current facility-administered medications for this visit.     SURGICAL HISTORY:  Past Surgical History:  Procedure Laterality Date  . ABDOMINAL AORTIC ANEURYSM REPAIR    . CARDIAC CATHETERIZATION  2001  . CARDIAC CATHETERIZATION N/A 07/16/2014   Procedure: Right/Left Heart Cath and Coronary Angiography;  Surgeon: Burnell Blanks, MD;  Location: [facility];  Service: Cardiovascular;  Laterality: N/A;  . CHOLECYSTECTOMY    . COLONOSCOPY W/ POLYPECTOMY    . CORONARY ARTERY BYPASS GRAFT    . ENDARTERECTOMY FEMORAL Left 08/19/2014   Procedure: PROFUNDA FEMORIS ENDARTERECTOMY ;  Surgeon: Rosetta Posner, MD;  Location: [facility];  Service: Vascular;  Laterality: Left;  . EYE SURGERY Bilateral    cataract removal  . FEMORAL-POPLITEAL BYPASS GRAFT Left 08/19/2014   Procedure: FEMORAL-POPLITEAL ARTERY BYPASS GRAFT WITH PROPATEN GORTEX ;  Surgeon: Rosetta Posner, MD;  Location: [facility];  Service: Vascular;  Laterality: Left;    . heart bypass    . PATCH ANGIOPLASTY Left 08/19/2014   Procedure: LEFT PROFUNDA FEMORIS PATCH ANGIOPLASTY USING HEMASHIELD PLATINUM FINESSE PATCH;  Surgeon: Rosetta Posner, MD;  Location: [facility];  Service: Vascular;  Laterality: Left;  . PERIPHERAL VASCULAR CATHETERIZATION N/A 08/05/2014   Procedure: Abdominal Aortogram w/Lower Extremity;  Surgeon: Wellington Hampshire, MD;  Location: [facility];  Service: Cardiovascular;  Laterality: N/A;  . SKIN CANCER EXCISION N/A    Nose  . VIDEO BRONCHOSCOPY WITH ENDOBRONCHIAL NAVIGATION N/A 07/29/2014   Procedure: VIDEO BRONCHOSCOPY WITH ENDOBRONCHIAL NAVIGATION;  Surgeon: Collene Gobble, MD;  Location: [facility];  Service: Thoracic;  Laterality: N/A;    REVIEW OF SYSTEMS:  A comprehensive review of systems was negative except for: Respiratory: positive for cough and dyspnea on exertion   PHYSICAL EXAMINATION: General appearance: alert, cooperative and no distress Head: Normocephalic, without obvious abnormality, atraumatic Neck: no adenopathy, no JVD, supple, symmetrical, trachea midline and thyroid not enlarged, symmetric, no tenderness/mass/nodules Lymph nodes: Cervical, supraclavicular, and axillary nodes normal. Resp: clear to auscultation bilaterally Back: symmetric, no curvature. ROM normal. No CVA tenderness. Cardio: regular rate and rhythm, S1, S2 normal, no murmur, click, rub or gallop GI: soft, non-tender; bowel sounds normal; no masses,  no organomegaly Extremities: extremities normal, atraumatic, no cyanosis or edema Neurologic: Alert and oriented X 3, normal strength and tone. Normal symmetric reflexes. Normal coordination and gait  ECOG PERFORMANCE STATUS: 1 - Symptomatic but completely ambulatory  There were no vitals taken for this visit.  LABORATORY DATA: Lab Results  Component Value Date   WBC 4.2 10/07/2015   HGB 12.2 (L) 10/07/2015   HCT 36.6 (L) 10/07/2015   MCV 96.1 10/07/2015   PLT 145 10/07/2015      Chemistry       Component Value Date/Time   NA 134 (L) 10/07/2015 0956   K 4.8 10/07/2015 0956   CL 105 05/27/2015 1312   CO2 20 (L) 10/07/2015 0956   BUN 23.1 10/07/2015 0956   CREATININE 1.4 (H) 10/07/2015 0956      Component Value Date/Time   CALCIUM 9.0 10/07/2015 0956   ALKPHOS 81 10/07/2015 0956   AST 38 (H) 10/07/2015 0956   ALT 18 10/07/2015 0956   BILITOT 0.33 10/07/2015 0956       RADIOGRAPHIC STUDIES: Ct Chest W Contrast  Result Date: 10/07/2015 CLINICAL DATA:  Restaging lung carcinoma. Radiation therapy and chemotherapy complete. CT CHEST WITH CONTRAST TECHNIQUE: Multidetector CT imaging of the chest was performed during intravenous contrast administration. CONTRAST:  23m ISOVUE-300 IOPAMIDOL (ISOVUE-300) INJECTION 61% COMPARISON:  CT 07/12/2015 FINDINGS: Cardiovascular: Coronary artery calcification and aortic atherosclerotic calcification. Bypass graft anatomy Mediastinum/Nodes: No axillary or supraclavicular lymphadenopathy. No mediastinal hilar lymphadenopathy. No pericardial fluid. Lungs/Pleura: RIGHT upper lobe nodular lesion measures 2.0 by 1.5 cm measuring at similar level (image 11, series 4. More inferiorly lesion measuring 24 by 16  mm(image 13, series 4) compared to 24 x 14 mm. Centrally within this lesion there is a small hypervascular focus measuring 7 mm (image 13, series 2). Is not changed in size from prior. The vessel feeding this small vascular lesion is diminutive. This could represent a small venous varix or AVM. No change from prior. Again demonstrated ill-defined nodule within the LEFT lower lobe measuring 10 mm (image 45, series 4) compared to 10 mm. Upper Abdomen: Limited view of the liver, kidneys, pancreas are unremarkable. Normal adrenal glands. Musculoskeletal: No aggressive osseous lesion. IMPRESSION: 1. No significant change in RIGHT upper lobe nodular density. 2. 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 Recommend attention on follow-up. 3. No evidence of mediastinal lymphadenopathy. 4. Stable lower lobe pulmonary nodule on the LEFT. Electronically Signed   By: Suzy Bouchard M.D.   On: 10/07/2015 14:01    ASSESSMENT AND PLAN: This is a very pleasant 75 years old white male with a stage IV non-small cell lung cancer status post 6 cycles of systemic chemotherapy with carboplatin and Alimta. The patient tolerated his current treatment fairly well with no significant adverse effect except for mild fatigue. This was followed by palliative radiotherapy to the right upper lobe lung mass under the care of Dr. Sondra Come. The recent CT scan of the chest showed no evidence for disease progression. I discussed the scan results with the patient and recommended for him to continue on observation with repeat CT scan of the chest in 3 months. For smoke cessation, I strongly encouraged the patient to quit smoking and offered him a smoke cessation program. The patient was advised to call immediately if he has any concerning symptoms in the interval. The patient voices understanding of current disease status and treatment options and is in agreement with the current care plan.  All questions were answered. The patient knows to call the clinic with any problems, questions or concerns. We can certainly see the patient much sooner if necessary.  Disclaimer: This note was dictated with voice recognition software. Similar sounding words can inadvertently be transcribed and may not be corrected upon review.

## 2015-10-14 NOTE — Progress Notes (Signed)
Steve Baker here for follow up.  He denies having pain.  He continues to have a productive, frequent cough with clear/white sputum.  He denies having hemoptysis.  He reports occasianal shortness of breath.  He denies having a sore throat or trouble swallowing.  He reports having fatigue with activity.  He will see Dr. Julien Nordmann today.  BP (!) 141/63 (BP Location: Left Arm, Patient Position: Sitting)   Pulse 63   Temp 97.4 F (36.3 C) (Oral)   Ht 5' 8.5" (1.74 m)   Wt 206 lb 12.8 oz (93.8 kg)   SpO2 98%   BMI 30.99 kg/m    Wt Readings from Last 3 Encounters:  10/14/15 206 lb 12.8 oz (93.8 kg)  10/12/15 205 lb 12.8 oz (93.4 kg)  07/13/15 198 lb 3.2 oz (89.9 kg)

## 2015-10-14 NOTE — Progress Notes (Signed)
Radiation Oncology         [phone] ________________________________  Name: Steve Baker MRN: [id number]  Date: 10/14/2015  DOB: 11/28/1940  Follow-Up Visit Note  CC: Steve Downing, MD  Steve Bears, MD    ICD-9-CM ICD-10-CM   1. Adenocarcinoma of right lung (HCC) 162.9 C34.91     Diagnosis:   Stage IV (T2a, N2) adenocarcinoma of right lung  Interval Since Last Radiation: 5 months. Completed radiation 06/03/2015.  Indication for treatment: Progression after chemotherapy, patient initially felt to have stage IV disease with brain metastasis however on repeat brain imaging there was no evidence of brain metastasis  Narrative:  The patient returns today for routine follow-up. He denies having pain.  He continues to have a productive, frequent cough with clear/white sputum.  He denies having hemoptysis.  He reports occasianal shortness of breath.  He denies having a sore throat or trouble swallowing.  He reports having fatigue with activity.  He will see Dr. Julien Baker today. Patient reports that he is breathing 'okay.' with no chest pain.   ALLERGIES:  has No Known Allergies.  Meds: Current Outpatient Prescriptions  Medication Sig Dispense Refill  . aspirin EC 81 MG tablet Take 81 mg by mouth daily.    . carvedilol (COREG) 3.125 MG tablet Take 1 tablet (3.125 mg total) by mouth 2 (two) times daily with a meal. 60 tablet 11  . lisinopril (PRINIVIL,ZESTRIL) 40 MG tablet Take 1 tablet (40 mg total) by mouth daily. 90 tablet 3  . rosuvastatin (CRESTOR) 20 MG tablet Take 1 tablet (20 mg total) by mouth daily. 30 tablet 11  . SPIRIVA RESPIMAT 2.5 MCG/ACT AERS INHALE TWO SPRAY(S) BY MOUTH ONCE DAILY 1 Inhaler 5  . spironolactone (ALDACTONE) 25 MG tablet Take 1 tablet (25 mg total) by mouth daily. 30 tablet 11   No current facility-administered medications for this encounter.     Physical Findings: The patient is in no acute distress. Patient is alert and oriented.  height  is 5' 8.5" (1.74 m) and weight is 206 lb 12.8 oz (93.8 kg). His oral temperature is 97.4 F (36.3 C). His blood pressure is 141/63 (abnormal) and his pulse is 63. His oxygen saturation is 98%. .   No palpable subclavicular or axillary adenopathy. The lungs are clear to auscultation. The heart has a regular rhythm and rate. The abdomen is soft and nontender with normal bowel sounds.  Physical Exam   Lab Findings: Lab Results  Component Value Date   WBC 4.2 10/07/2015   HGB 12.2 (L) 10/07/2015   HCT 36.6 (L) 10/07/2015   MCV 96.1 10/07/2015   PLT 145 10/07/2015    Radiographic Findings: Ct Chest W Contrast  Result Date: 10/07/2015 CLINICAL DATA:  Restaging lung carcinoma. Radiation therapy and chemotherapy complete. CT CHEST WITH CONTRAST TECHNIQUE: Multidetector CT imaging of the chest was performed during intravenous contrast administration. CONTRAST:  43m ISOVUE-300 IOPAMIDOL (ISOVUE-300) INJECTION 61% COMPARISON:  CT 07/12/2015 FINDINGS: Cardiovascular: Coronary artery calcification and aortic atherosclerotic calcification. Bypass graft anatomy Mediastinum/Nodes: No axillary or supraclavicular lymphadenopathy. No mediastinal hilar lymphadenopathy. No pericardial fluid. Lungs/Pleura: RIGHT upper lobe nodular lesion measures 2.0 by 1.5 cm measuring at similar level (image 11, series 4. More inferiorly lesion measuring 24 by 16 mm(image 13, series 4) compared to 24 x 14 mm. Centrally within this lesion there is a small hypervascular focus measuring 7 mm (image 13, series 2). Is not changed in size from prior. The vessel feeding this small vascular lesion is  diminutive. This could represent a small venous varix or AVM. No change from prior. Again demonstrated ill-defined nodule within the LEFT lower lobe measuring 10 mm (image 45, series 4) compared to 10 mm. Upper Abdomen: Limited view of the liver, kidneys, pancreas are unremarkable. Normal adrenal glands. Musculoskeletal: No aggressive osseous  lesion. IMPRESSION: 1. No significant change in RIGHT upper lobe nodular density. 2. Small vascular focus within the lesion is most consistent with a small arteriovenous malformation (congenital or iatrogenic). Recommend attention on follow-up. 3. No evidence of mediastinal lymphadenopathy. 4. Stable lower lobe pulmonary nodule on the LEFT. Electronically Signed   By: Suzy Bouchard M.D.   On: 10/07/2015 14:01    Impression:  The patient is recovering from the effects of radiation. Stable situation on recent chest CT scan  Plan:  I gave the patient the result of his chest CT results. I will release the patient to the care of Dr. Julien Baker.    ____________________________________  Blair Promise, PhD, MD    This document serves as a record of services personally performed by Gery Pray, MD. It was created on his behalf by Truddie Hidden, a trained medical scribe. The creation of this record is based on the scribe's personal observations and the provider's statements to them. This document has been checked and approved by the attending provider.

## 2015-10-14 NOTE — Telephone Encounter (Signed)
per pof to sch pt appt-gave pt copy of avs °

## 2015-10-19 ENCOUNTER — Ambulatory Visit: Payer: Medicare Other | Admitting: Internal Medicine

## 2015-10-19 ENCOUNTER — Other Ambulatory Visit: Payer: Medicare Other

## 2015-10-27 NOTE — Addendum Note (Signed)
Addended by: Mena Goes on: 10/27/2015 04:12 PM   Modules accepted: Orders

## 2015-11-09 ENCOUNTER — Encounter: Payer: Self-pay | Admitting: Emergency Medicine

## 2015-11-09 ENCOUNTER — Ambulatory Visit (INDEPENDENT_AMBULATORY_CARE_PROVIDER_SITE_OTHER): Payer: Medicare Other | Admitting: Emergency Medicine

## 2015-11-09 DIAGNOSIS — J438 Other emphysema: Secondary | ICD-10-CM | POA: Diagnosis not present

## 2015-11-09 DIAGNOSIS — C3491 Malignant neoplasm of unspecified part of right bronchus or lung: Secondary | ICD-10-CM | POA: Diagnosis not present

## 2015-11-09 MED ORDER — ALBUTEROL SULFATE HFA 108 (90 BASE) MCG/ACT IN AERS: 2.0000 | INHALATION_SPRAY | Freq: Four times a day (QID) | RESPIRATORY_TRACT | 5 refills | Status: AC | PRN

## 2015-11-09 NOTE — Assessment & Plan Note (Signed)
Some increased symptoms including wheezing that I suspect will respond to albuterol. He is progressing in the setting of continued smoking. One of his symptoms is cough and I wonder if changing his lisinopril to an alternative may be helpful. Of course he has other issues influencing cough especially smoking. I will add albuterol when necessary to his Spiriva. If he benefits from this then we might decide to change to an alternative like Anoro.

## 2015-11-09 NOTE — Assessment & Plan Note (Signed)
CT scan and follow-up with Dr. Julien Nordmann arranged.

## 2015-11-09 NOTE — Patient Instructions (Signed)
Please continue Spiriva 2 puffs once a day We will start albuterol, 2 puffs up to every 4 hours if needed for shortness of breath.  Please discuss with Dr. Radford Pax the possible option of changing your lisinopril to an alternative blood pressure medicine. This medication may be contributing to your cough. I suspect that your smoking is also contributing. You need work on decreasing cigarettes as much as possible. Follow with Dr. Inda Merlin as planned Repeat CT scan of the chest in November as already arranged Follow with Dr Lamonte Sakai in 4 months or sooner if you have any problems.

## 2015-11-09 NOTE — Progress Notes (Signed)
Subjective:    Patient ID: Steve Baker, male    DOB: 1940-11-21, 75 y.o.   MRN: [id number]  HPI 75 year old active smoker (26 pk-yrs) with history of CAD/CABG, abdominal aneurysm repair, hypertension, prostate CA (2001), hyperlipidemia. He underwent a screening CT scan of the chest 06/04/14 ordered by Dr. Arelia Sneddon that showed a dominant right upper lobe mass and a smaller left lower lobe nodule. He was evaluated in the lung cancer screening program and a PET scan has been obtained 06/18/14 > both lesions are hypermetabolic, as was a slightly enlarged right peritracheal node. There was evidence of pharyngeal esophageal activity without a corresponding anatomical abnormality on CT scan.   He denies any hemoptysis although he has had frequent cough. He has dyspnea that appears to be stable. He is not on any bronchodilators at this time. He has about 6 pounds of unintentional weight loss over the last month.   ROV 08/11/14 -- follow-up visit for tobacco use, presumed COPD, and newly diagnosed adenocarcinoma of the lung. This was obtained from biopsy of his right upper lobe mass. He also had a smaller left lower lobe nodule. Cytology on this was not definitive for malignancy. I placed fiducial markers in the right upper lobe mass and also triangulating the left lower lobe nodule. He has a mediastinal node that is hypermetabolic on PET scan. I considered biopsying this but I do not believe it'll change his management because he is a poor surgical candidate based on his clinical status. He is scheduled for [facility] this Thursday.  Hx taken from the pt and his wife. He is still having cough, has some exertional SOB.   ROV 12/22/14 -- follow-up visit for COPD, tobacco use, and adenocarcinoma the lung for which she is undergoing chemotherapy with Dr. Julien Nordmann. Last Ct scan chest 11/05/14 showed that RUL mass and LLL nodule were shrinking. His breathing continues to limit him. He is on spiriva although unsure whether this helps  him. He has daily cough, prod of clear to white. Smoking 1 pk a day.   ROV 05/13/15 -- this is a follow-up visit for history of COPD, continued tobacco abuse and adenocarcinoma the lung. His last CT scan of the chest was 03/12/15 which showed some slight interval increase in his right upper lobe mass. He has started radiation therapy treatment, follows with Dr Sondra Come. Plan for CT chest in April. He has exertional SOB, has sore throat, lot of cough. Prod of thick white / clear.  Bothers him most days. Tolerates spiriva.  Has SABA available but has not used. Smoking 2.5 pk/day.  No flares, ED, pred , abx.   ROV 11/09/15 -- Patient has a history of COPD and had a carcinoma of the lung followed by radiation oncology and also by Dr. Julien Nordmann. He underwent Systemic chemotherapy and appeared to have tolerated reasonably well. Most recent CT scan of the chest was 10/07/15 and I have personally reviewed. This showed no significant difference in his right upper lobe nodular density, left lower lobe pulmonary nodule . He continues to smoke about a pack a day.  He has had wheeze, some increase in his cough prod of phlegm. Can happen at any time.  No hemoptysis. He is on Spiriva daily. His functional capacity is unchanged, able to exert if he paces himself.   Review of Systems As per HPI   Past Medical History:  Diagnosis Date  . Anemia   . Anxiety   . 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 COPD (chronic obstructive pulmonary disease) ([place])   . Coronary artery disease    s/p CABG now with 2/4 grafts patent by cath 2016 - medical management  . DCM (dilated cardiomyopathy) ([place]) 02/18/2015  . Dysrhythmia   . Full code status 12/23/2014  . History of bronchitis   . Hyperlipidemia   . Hyperlipidemia LDL goal <70 02/18/2015  . Hypertension    lisinopril  . Lung cancer ([place])    adenocarcinoma of right lung  . Peripheral vascular disease ([place])    s/p aortobifemoral bypass and left profundus femoral endarterectomy  with patch angioplasty and left femoral to above the knee popliteal bypass  . Prostate cancer ([place]) 2001   seeds  . Radiation 04/14/15-06/03/15   50.4 Gy to right lung with an additional boost of 12.6 Gy to right lung  . Shortness of breath dyspnea    with ambulation     Family History  Problem Relation Age of Onset  . Heart Problems Mother   . Heart attack Father   . Colon cancer Sister   . Stroke Neg Hx      Social History   Social History  . Marital status: Married    Spouse name: nancy  . Number of children: 2  . Years of education: college   Occupational History  . retired    Social History Main Topics  . Smoking status: Current Every Day Smoker    Packs/day: 2.50    Years: 57.00    Types: Cigarettes  . Smokeless tobacco: Never Used  . Alcohol use 1.8 oz/week    3 Cans of beer per week     Comment: daily  . Drug use: No  . Sexual activity: Not Currently   Other Topics Concern  . Not on file   Social History Narrative  . 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 aspirin EC 81 MG tablet Take 81 mg by mouth daily.    . carvedilol (COREG) 3.125 MG tablet Take 1 tablet (3.125 mg total) by mouth 2 (two) times daily with a meal. 60 tablet 11  . lisinopril (PRINIVIL,ZESTRIL) 40 MG tablet Take 1 tablet (40 mg total) by mouth daily. 90 tablet 3  . rosuvastatin (CRESTOR) 20 MG tablet Take 1 tablet (20 mg total) by mouth daily. 30 tablet 11  . SPIRIVA RESPIMAT 2.5 MCG/ACT AERS INHALE TWO SPRAY(S) BY MOUTH ONCE DAILY 1 Inhaler 5  . spironolactone (ALDACTONE) 25 MG tablet Take 1 tablet (25 mg total) by mouth daily. 30 tablet 11   No facility-administered medications prior to visit.        Objective:   Physical Exam Vitals:   11/09/15 1050  BP: 140/76  Pulse: 71  SpO2: 95%  Weight: 209 lb  (94.8 kg)  Height: 5' 8.5" (1.74 m)   Gen: Pleasant, well-nourished, in no distress,  normal affect  ENT: No lesions,  Tongue is blackened with cigarette tar,  no postnasal drip  Neck: No JVD, no TMG, no carotid bruits  Lungs: No use of accessory muscles,  clear without rales or rhonchi  Cardiovascular: RRR, heart sounds normal, no murmur or gallops, no peripheral edema  Musculoskeletal: No deformities, no cyanosis or clubbing. He does have nicotine staining on his nails  Neuro: alert, non focal  Skin: Warm, no lesions or rashes     Assessment & Plan:  COPD (chronic obstructive pulmonary disease) Some increased symptoms including wheezing that I suspect will respond to albuterol. He is progressing in the setting of continued smoking. One of his symptoms is cough and I wonder if changing his lisinopril to an alternative may be helpful. Of course he has other issues influencing cough especially smoking. I will add albuterol when necessary to his Spiriva. If he benefits from this then we might decide to change to an alternative like Anoro.   Adenocarcinoma of right lung CT scan and follow-up with Dr. Julien Nordmann arranged.  Baltazar Apo, MD, PhD 11/09/2015, 11:15 AM [facility] [phone] or if no answer [phone]

## 2015-11-09 NOTE — Addendum Note (Signed)
Addended by: Desmond Dike C on: 11/09/2015 11:16 AM   Modules accepted: Orders

## 2015-11-21 ENCOUNTER — Encounter: Payer: Self-pay | Admitting: Cardiology

## 2015-11-21 NOTE — Progress Notes (Signed)
Cardiology Office Note    Date:  11/23/2015   ID:  Steve Baker, DOB 07-Mar-1940, MRN [id number]  PCP:  Leonard Downing, MD  Cardiologist:  Fransico Him, MD   Chief Complaint  Patient presents with  . Coronary Artery Disease  . Hypertension  . Hyperlipidemia    History of Present Illness:  Steve Baker is a 75 y.o. 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 Fletcher Anon, prostate CA (2001), hyperlipidemia and stage IV non-small cell lung CA. He had a cath done 07/2014 due to abnormal nuclear stress test done for pre op workup for lung surgery and was found to have severe 3 vessel ASCAD with 2/4 grafts patent and has been on medical therapy. He is followed by Dr. Fletcher Anon due to severe claudication and has a history of left profundus femoral endarterectomy and patch angioplasty and left femoral to above the knee popliteal bypass by Dr. Donnetta Hutching. He has had complete resolution of the leg pain since peripheral revascularization. He denies any chest pain or pressure. He has chronic DOE that is stable.He denies any  palpitations or syncope.  He denies any LE edema. He continues to smoke. Occasionally he will feel dizzy when he first stands up.      Past Medical History:  Diagnosis Date  . Anemia   . Anxiety   . Brain cancer ([place])    brain metastasis  . COPD (chronic obstructive pulmonary disease) ([place])   . Coronary artery disease    s/p CABG now with 2/4 grafts patent by cath 2016 - medical management  . DCM (dilated cardiomyopathy) ([place]) 02/18/2015  . Dysrhythmia   . Full code status 12/23/2014  . History of bronchitis   . Hyperlipidemia LDL goal <70 02/18/2015  . Hypertension    lisinopril  . Lung cancer ([place])    adenocarcinoma of right lung  . Peripheral vascular disease ([place])    s/p aortobifemoral bypass and left profundus femoral endarterectomy with  patch angioplasty and left femoral to above the knee popliteal bypass  . Prostate cancer ([place]) 2001   seeds  . Radiation 04/14/15-06/03/15   50.4 Gy to right lung with an additional boost of 12.6 Gy to right lung  . Shortness of breath dyspnea    with ambulation    Past Surgical History:  Procedure Laterality Date  . ABDOMINAL AORTIC ANEURYSM REPAIR    . CARDIAC CATHETERIZATION  2001  . CARDIAC CATHETERIZATION N/A 07/16/2014   Procedure: Right/Left Heart Cath and Coronary Angiography;  Surgeon: Burnell Blanks, MD;  Location: [facility];  Service: Cardiovascular;  Laterality: N/A;  . CHOLECYSTECTOMY    . COLONOSCOPY W/ POLYPECTOMY    . CORONARY ARTERY BYPASS GRAFT    . ENDARTERECTOMY FEMORAL Left 08/19/2014   Procedure: PROFUNDA FEMORIS ENDARTERECTOMY ;  Surgeon: Rosetta Posner, MD;  Location: [facility];  Service: Vascular;  Laterality: Left;  . EYE SURGERY Bilateral    cataract removal  . FEMORAL-POPLITEAL BYPASS GRAFT Left 08/19/2014   Procedure: FEMORAL-POPLITEAL ARTERY BYPASS GRAFT WITH PROPATEN GORTEX ;  Surgeon: Rosetta Posner, MD;  Location: [facility];  Service: Vascular;  Laterality: Left;  . heart bypass    . PATCH ANGIOPLASTY Left 08/19/2014   Procedure: LEFT PROFUNDA FEMORIS PATCH ANGIOPLASTY USING HEMASHIELD PLATINUM FINESSE PATCH;  Surgeon: Rosetta Posner, MD;  Location: [facility];  Service: Vascular;  Laterality: Left;  .  PERIPHERAL VASCULAR CATHETERIZATION N/A 08/05/2014   Procedure: Abdominal Aortogram w/Lower Extremity;  Surgeon: Wellington Hampshire, MD;  Location: [facility];  Service: Cardiovascular;  Laterality: N/A;  . SKIN CANCER EXCISION N/A    Nose  . VIDEO BRONCHOSCOPY WITH ENDOBRONCHIAL NAVIGATION N/A 07/29/2014   Procedure: VIDEO BRONCHOSCOPY WITH ENDOBRONCHIAL NAVIGATION;  Surgeon: Collene Gobble, MD;  [facility]: [facility];  Service: Thoracic;  Laterality: N/A;    Current Medications: Outpatient Medications Prior to Visit  Medication Sig Dispense Refill  .  albuterol (PROVENTIL HFA;VENTOLIN HFA) 108 (90 Base) MCG/ACT inhaler Inhale 2 puffs into the lungs every 6 (six) hours as needed for wheezing or shortness of breath. 1 Inhaler 5  . aspirin EC 81 MG tablet Take 81 mg by mouth daily.    . carvedilol (COREG) 3.125 MG tablet Take 1 tablet (3.125 mg total) by mouth 2 (two) times daily with a meal. 60 tablet 11  . lisinopril (PRINIVIL,ZESTRIL) 40 MG tablet Take 1 tablet (40 mg total) by mouth daily. 90 tablet 3  . rosuvastatin (CRESTOR) 20 MG tablet Take 1 tablet (20 mg total) by mouth daily. 30 tablet 11  . SPIRIVA RESPIMAT 2.5 MCG/ACT AERS INHALE TWO SPRAY(S) BY MOUTH ONCE DAILY 1 Inhaler 5  . spironolactone (ALDACTONE) 25 MG tablet Take 1 tablet (25 mg total) by mouth daily. 30 tablet 11   No facility-administered medications prior to visit.      Allergies:   Review of patient's allergies indicates no known allergies.   Social History   Social History  . Marital status: Married    Spouse name: nancy  . Number of children: 2  . Years of education: college   Occupational History  . retired    Social History Main Topics  . Smoking status: Current Every Day Smoker    Packs/day: 2.50    Years: 57.00    Types: Cigarettes  . Smokeless tobacco: Never Used  . Alcohol use 1.8 oz/week    3 Cans of beer per week     Comment: daily  . Drug use: No  . Sexual activity: Not Currently   Other Topics Concern  . None   Social History Narrative  . None     Family History:  The patient's family history includes Colon cancer in his sister; Heart Problems in his mother; Heart attack in his father.   ROS:   Please see the history of present illness.    ROS All other systems reviewed and are negative.  No flowsheet data found.     PHYSICAL EXAM:   VS:  BP 132/76   Pulse 79   Ht 5' 8.5" (1.74 m)   Wt 209 lb (94.8 kg)   SpO2 94%   BMI 31.32 kg/m    GEN: Well nourished, well developed, in no acute distress  HEENT: normal  Neck: no  JVD, carotid bruits, or masses Cardiac: RRR; no murmurs, rubs, or gallops,no edema.  Intact distal pulses bilaterally.  Respiratory:  clear to auscultation bilaterally, normal work of breathing GI: soft, nontender, nondistended, + BS MS: no deformity or atrophy  Skin: warm and dry, no rash Neuro:  Alert and Oriented x 3, Strength and sensation are intact Psych: euthymic mood, full affect  Wt Readings from Last 3 Encounters:  11/23/15 209 lb (94.8 kg)  11/09/15 209 lb (94.8 kg)  10/14/15 206 lb 12.8 oz (93.8 kg)      Studies/Labs Reviewed:   EKG:  EKG is ordered today.  The ekg ordered today demonstrates   Recent Labs: 10/07/2015: ALT 18; BUN 23.1; Creatinine 1.4; HGB 12.2; Platelets 145; Potassium 4.8; Sodium 134   Lipid Panel    Component Value Date/Time   CHOL 144 05/17/2015 1136   TRIG 137 05/17/2015 1136   HDL 49 05/17/2015 1136   CHOLHDL 2.9 05/17/2015 1136   VLDL 27 05/17/2015 1136   LDLCALC 68 05/17/2015 1136    Additional studies/ records that were reviewed today include:  none    ASSESSMENT:    1. Coronary artery disease due to lipid rich plaque   2. Benign essential HTN   3. Chronic systolic heart failure ([place])   4. Ischemic cardiomyopathy   5. PAD (peripheral artery disease) ([place])   6. Hyperlipidemia LDL goal <70   7. Tobacco abuse      PLAN:  In order of problems listed above:  1. ASCAD with remote CABG and cath with 2/4 grafts patent in 2016.  He has no current anginal symptoms but continues to smoke.  Continue ASA/BB/statin. 2. HTN - BP controlled on current meds.  Continue BB/ACE I/aldactone. 3. Chronic systolic CHF - he appears euvolemic on exam.  Continue BB/ACE I and diuretic.   4. Ischemic DCM last EF 35-40% by echo 04/2015.  No indication for AICD at this time. 5. PAD followed by Dr. Fletcher Anon.  Continue ASA/statin.   6. Hyperlipidemia with LDL goal < 70. Continue statin.  Check FLP and ALT 7. Tobacco use - I encouraged him to try to quit.  He  has failed Wellbutrin and Chantix    Medication Adjustments/Labs and Tests Ordered: Current medicines are reviewed at length with the patient today.  Concerns regarding medicines are outlined above.  Medication changes, Labs and Tests ordered today are listed in the Patient Instructions below.  There are no Patient Instructions on file for this visit.   Signed, Fransico Him, MD  11/23/2015 3:29 PM    [address], [place], [place]  [address] Phone: [phone]; Fax: [phone]

## 2015-11-23 ENCOUNTER — Ambulatory Visit (INDEPENDENT_AMBULATORY_CARE_PROVIDER_SITE_OTHER): Payer: Medicare Other | Admitting: Cardiology

## 2015-11-23 ENCOUNTER — Encounter: Payer: Self-pay | Admitting: Cardiology

## 2015-11-23 VITALS — BP 132/76 | HR 79 | Ht 68.5 in | Wt 209.0 lb

## 2015-11-23 DIAGNOSIS — I255 Ischemic cardiomyopathy: Secondary | ICD-10-CM

## 2015-11-23 DIAGNOSIS — I5022 Chronic systolic (congestive) heart failure: Secondary | ICD-10-CM | POA: Diagnosis not present

## 2015-11-23 DIAGNOSIS — E785 Hyperlipidemia, unspecified: Secondary | ICD-10-CM

## 2015-11-23 DIAGNOSIS — I2510 Atherosclerotic heart disease of native coronary artery without angina pectoris: Secondary | ICD-10-CM | POA: Diagnosis not present

## 2015-11-23 DIAGNOSIS — Z720 Tobacco use: Secondary | ICD-10-CM

## 2015-11-23 DIAGNOSIS — I2583 Coronary atherosclerosis due to lipid rich plaque: Principal | ICD-10-CM

## 2015-11-23 DIAGNOSIS — I10 Essential (primary) hypertension: Secondary | ICD-10-CM

## 2015-11-23 DIAGNOSIS — I739 Peripheral vascular disease, unspecified: Secondary | ICD-10-CM

## 2015-11-23 NOTE — Patient Instructions (Signed)
Medication Instructions:  Your physician recommends that you continue on your current medications as directed. Please refer to the Current Medication list given to you today.   Labwork: 1 WEEK FOR BMET, FASTING LIPID AND LIVER PANEL  Testing/Procedures: NONE  Follow-Up: 1. Your physician wants you to follow-up in: [place] DR. Theodosia Blender APP'S You will receive a reminder letter in the mail two months in advance. If you don't receive a letter, please call our office to schedule the follow-up appointment.   2. Your physician wants you to follow-up in: [place] will receive a reminder letter in the mail two months in advance. If you don't receive a letter, please call our office to schedule the follow-up appointment.    Any Other Special Instructions Will Be Listed Below (If Applicable).     If you need a refill on your cardiac medications before your next appointment, please call your pharmacy.

## 2015-12-01 ENCOUNTER — Other Ambulatory Visit: Payer: Self-pay | Admitting: Cardiology

## 2015-12-01 ENCOUNTER — Other Ambulatory Visit: Payer: Medicare Other | Admitting: *Deleted

## 2015-12-01 DIAGNOSIS — I2510 Atherosclerotic heart disease of native coronary artery without angina pectoris: Secondary | ICD-10-CM

## 2015-12-01 DIAGNOSIS — E785 Hyperlipidemia, unspecified: Secondary | ICD-10-CM

## 2015-12-01 DIAGNOSIS — I2583 Coronary atherosclerosis due to lipid rich plaque: Principal | ICD-10-CM

## 2015-12-01 DIAGNOSIS — I5022 Chronic systolic (congestive) heart failure: Secondary | ICD-10-CM

## 2015-12-01 DIAGNOSIS — I10 Essential (primary) hypertension: Secondary | ICD-10-CM

## 2015-12-01 LAB — BASIC METABOLIC PANEL
BUN: 15 mg/dL (ref 7–25)
CHLORIDE: 106 mmol/L (ref 98–110)
CO2: 22 mmol/L (ref 20–31)
CREATININE: 1.41 mg/dL — AB (ref 0.70–1.18)
Calcium: 9 mg/dL (ref 8.6–10.3)
Glucose, Bld: 96 mg/dL (ref 65–99)
POTASSIUM: 4.9 mmol/L (ref 3.5–5.3)
SODIUM: 136 mmol/L (ref 135–146)

## 2015-12-01 LAB — LIPID PANEL
CHOLESTEROL: 167 mg/dL (ref 125–200)
HDL: 42 mg/dL (ref >=40)
LDL Cholesterol: 73 mg/dL (ref <130)
TRIGLYCERIDES: 261 mg/dL — AB (ref <150)
Total CHOL/HDL Ratio: 4 Ratio (ref <=5.0)
VLDL: 52 mg/dL — ABNORMAL HIGH (ref <30)

## 2015-12-01 LAB — HEPATIC FUNCTION PANEL
ALK PHOS: 68 U/L (ref 40–115)
ALT: 10 U/L (ref 9–46)
AST: 27 U/L (ref 10–35)
Albumin: 4 g/dL (ref 3.6–5.1)
BILIRUBIN INDIRECT: 0.3 mg/dL (ref 0.2–1.2)
Bilirubin, Direct: 0.1 mg/dL (ref <=0.2)
TOTAL PROTEIN: 6.4 g/dL (ref 6.1–8.1)
Total Bilirubin: 0.4 mg/dL (ref 0.2–1.2)

## 2015-12-02 ENCOUNTER — Encounter: Payer: Self-pay | Admitting: Cardiology

## 2015-12-02 NOTE — Telephone Encounter (Signed)
Follow Up:    Returning call from today,concerning his lab results.

## 2015-12-02 NOTE — Telephone Encounter (Signed)
This encounter was created in error - please disregard.

## 2015-12-07 LAB — TSH: TSH: 5.02 mIU/L — ABNORMAL HIGH (ref 0.40–4.50)

## 2015-12-14 ENCOUNTER — Other Ambulatory Visit: Payer: Self-pay

## 2015-12-14 DIAGNOSIS — E781 Pure hyperglyceridemia: Secondary | ICD-10-CM

## 2015-12-15 ENCOUNTER — Other Ambulatory Visit: Payer: Medicare Other | Admitting: *Deleted

## 2015-12-15 DIAGNOSIS — E781 Pure hyperglyceridemia: Secondary | ICD-10-CM

## 2015-12-15 LAB — ALT: ALT: 9 U/L (ref 9–46)

## 2015-12-15 LAB — LIPID PANEL
CHOL/HDL RATIO: 4.1 ratio (ref <=5.0)
Cholesterol: 163 mg/dL (ref 125–200)
HDL: 40 mg/dL (ref >=40)
LDL CALC: 86 mg/dL (ref <130)
Triglycerides: 187 mg/dL — ABNORMAL HIGH (ref <150)
VLDL: 37 mg/dL — ABNORMAL HIGH (ref <30)

## 2015-12-15 LAB — HEMOGLOBIN A1C
HEMOGLOBIN A1C: 5.4 % (ref <5.7)
Mean Plasma Glucose: 108 mg/dL

## 2015-12-29 ENCOUNTER — Other Ambulatory Visit: Payer: Self-pay | Admitting: Emergency Medicine

## 2016-01-12 ENCOUNTER — Other Ambulatory Visit (HOSPITAL_BASED_OUTPATIENT_CLINIC_OR_DEPARTMENT_OTHER): Payer: Medicare Other

## 2016-01-12 ENCOUNTER — Encounter (HOSPITAL_COMMUNITY): Payer: Self-pay

## 2016-01-12 ENCOUNTER — Ambulatory Visit (HOSPITAL_COMMUNITY)
Admission: RE | Admit: 2016-01-12 | Discharge: 2016-01-12 | Disposition: A | Discharge status: Home / Self Care | Payer: Medicare Other | Source: Ambulatory Visit | Attending: Internal Medicine | Admitting: Internal Medicine

## 2016-01-12 DIAGNOSIS — C3491 Malignant neoplasm of unspecified part of right bronchus or lung: Secondary | ICD-10-CM | POA: Diagnosis present

## 2016-01-12 DIAGNOSIS — C3411 Malignant neoplasm of upper lobe, right bronchus or lung: Secondary | ICD-10-CM | POA: Diagnosis not present

## 2016-01-12 LAB — CBC WITH DIFFERENTIAL/PLATELET
BASO%: 1 % (ref 0.0–2.0)
BASOS ABS: 0 10*3/uL (ref 0.0–0.1)
EOS%: 6.4 % (ref 0.0–7.0)
Eosinophils Absolute: 0.3 10*3/uL (ref 0.0–0.5)
HEMATOCRIT: 41 % (ref 38.4–49.9)
HEMOGLOBIN: 13.2 g/dL (ref 13.0–17.1)
LYMPH#: 0.6 10*3/uL — AB (ref 0.9–3.3)
LYMPH%: 13.7 % — ABNORMAL LOW (ref 14.0–49.0)
MCH: 30.4 pg (ref 27.2–33.4)
MCHC: 32.2 g/dL (ref 32.0–36.0)
MCV: 94.5 fL (ref 79.3–98.0)
MONO#: 0.5 10*3/uL (ref 0.1–0.9)
MONO%: 12.1 % (ref 0.0–14.0)
NEUT#: 3 10*3/uL (ref 1.5–6.5)
NEUT%: 66.8 % (ref 39.0–75.0)
Platelets: 231 10*3/uL (ref 140–400)
RBC: 4.34 10*6/uL (ref 4.20–5.82)
RDW: 14.6 % (ref 11.0–14.6)
WBC: 4.4 10*3/uL (ref 4.0–10.3)

## 2016-01-12 LAB — COMPREHENSIVE METABOLIC PANEL
ALBUMIN: 3.6 g/dL (ref 3.5–5.0)
ALK PHOS: 115 U/L (ref 40–150)
ALT: 10 U/L (ref 0–55)
AST: 26 U/L (ref 5–34)
Anion Gap: 11 mEq/L (ref 3–11)
BILIRUBIN TOTAL: 0.5 mg/dL (ref 0.20–1.20)
BUN: 11.5 mg/dL (ref 7.0–26.0)
CALCIUM: 9.6 mg/dL (ref 8.4–10.4)
CO2: 24 mEq/L (ref 22–29)
CREATININE: 1.2 mg/dL (ref 0.7–1.3)
Chloride: 103 mEq/L (ref 98–109)
EGFR: 60 mL/min/{1.73_m2} — ABNORMAL LOW (ref >90)
Glucose: 93 mg/dl (ref 70–140)
POTASSIUM: 4.5 meq/L (ref 3.5–5.1)
Sodium: 138 mEq/L (ref 136–145)
Total Protein: 7.3 g/dL (ref 6.4–8.3)

## 2016-01-18 ENCOUNTER — Encounter: Payer: Self-pay | Admitting: Internal Medicine

## 2016-01-18 ENCOUNTER — Telehealth: Payer: Self-pay | Admitting: Internal Medicine

## 2016-01-18 ENCOUNTER — Ambulatory Visit (HOSPITAL_BASED_OUTPATIENT_CLINIC_OR_DEPARTMENT_OTHER): Payer: Medicare Other | Admitting: Internal Medicine

## 2016-01-18 VITALS — BP 119/58 | HR 63 | Temp 97.7°F | Resp 18 | Ht 68.5 in | Wt 206.8 lb

## 2016-01-18 DIAGNOSIS — C3411 Malignant neoplasm of upper lobe, right bronchus or lung: Secondary | ICD-10-CM

## 2016-01-18 DIAGNOSIS — Z5112 Encounter for antineoplastic immunotherapy: Secondary | ICD-10-CM

## 2016-01-18 DIAGNOSIS — C3491 Malignant neoplasm of unspecified part of right bronchus or lung: Secondary | ICD-10-CM

## 2016-01-18 HISTORY — DX: Encounter for antineoplastic immunotherapy: Z51.12

## 2016-01-18 NOTE — Patient Instructions (Signed)
Smoking Cessation, Tips for Success If you are ready to quit smoking, congratulations! You have chosen to help yourself be healthier. Cigarettes bring nicotine, tar, carbon monoxide, and other irritants into your body. Your lungs, heart, and blood vessels will be able to work better without these poisons. There are many different ways to quit smoking. Nicotine gum, nicotine patches, a nicotine inhaler, or nicotine nasal spray can help with physical craving. Hypnosis, support groups, and medicines help break the habit of smoking. WHAT THINGS CAN I DO TO MAKE QUITTING EASIER?  Here are some tips to help you quit for good:  Pick a date when you will quit smoking completely. Tell all of your friends and family about your plan to quit on that date.  Do not try to slowly cut down on the number of cigarettes you are smoking. Pick a quit date and quit smoking completely starting on that day.  Throw away all cigarettes.   Clean and remove all ashtrays from your home, work, and car.  On a card, write down your reasons for quitting. Carry the card with you and read it when you get the urge to smoke.  Cleanse your body of nicotine. Drink enough water and fluids to keep your urine clear or pale yellow. Do this after quitting to flush the nicotine from your body.  Learn to predict your moods. Do not let a bad situation be your excuse to have a cigarette. Some situations in your life might tempt you into wanting a cigarette.  Never have "just one" cigarette. It leads to wanting another and another. Remind yourself of your decision to quit.  Change habits associated with smoking. If you smoked while driving or when feeling stressed, try other activities to replace smoking. Stand up when drinking your coffee. Brush your teeth after eating. Sit in a different chair when you read the paper. Avoid alcohol while trying to quit, and try to drink fewer caffeinated beverages. Alcohol and caffeine may urge you to  smoke.  Avoid foods and drinks that can trigger a desire to smoke, such as sugary or spicy foods and alcohol.  Ask people who smoke not to smoke around you.  Have something planned to do right after eating or having a cup of coffee. For example, plan to take a walk or exercise.  Try a relaxation exercise to calm you down and decrease your stress. Remember, you may be tense and nervous for the first 2 weeks after you quit, but this will pass.  Find new activities to keep your hands busy. Play with a pen, coin, or rubber band. Doodle or draw things on paper.  Brush your teeth right after eating. This will help cut down on the craving for the taste of tobacco after meals. You can also try mouthwash.   Use oral substitutes in place of cigarettes. Try using lemon drops, carrots, cinnamon sticks, or chewing gum. Keep them handy so they are available when you have the urge to smoke.  When you have the urge to smoke, try deep breathing.  Designate your home as a nonsmoking area.  If you are a heavy smoker, ask your health care provider about a prescription for nicotine chewing gum. It can ease your withdrawal from nicotine.  Reward yourself. Set aside the cigarette money you save and buy yourself something nice.  Look for support from others. Join a support group or smoking cessation program. Ask someone at home or at work to help you with your plan   to quit smoking.  Always ask yourself, "Do I need this cigarette or is this just a reflex?" Tell yourself, "Today, I choose not to smoke," or "I do not want to smoke." You are reminding yourself of your decision to quit.  Do not replace cigarette smoking with electronic cigarettes (commonly called e-cigarettes). The safety of e-cigarettes is unknown, and some may contain harmful chemicals.  If you relapse, do not give up! Plan ahead and think about what you will do the next time you get the urge to smoke. HOW WILL I FEEL WHEN I QUIT SMOKING? You  may have symptoms of withdrawal because your body is used to nicotine (the addictive substance in cigarettes). You may crave cigarettes, be irritable, feel very hungry, cough often, get headaches, or have difficulty concentrating. The withdrawal symptoms are only temporary. They are strongest when you first quit but will go away within 10-14 days. When withdrawal symptoms occur, stay in control. Think about your reasons for quitting. Remind yourself that these are signs that your body is healing and getting used to being without cigarettes. Remember that withdrawal symptoms are easier to treat than the major diseases that smoking can cause.  Even after the withdrawal is over, expect periodic urges to smoke. However, these cravings are generally short lived and will go away whether you smoke or not. Do not smoke! WHAT RESOURCES ARE AVAILABLE TO HELP ME QUIT SMOKING? Your health care provider can direct you to community resources or hospitals for support, which may include:  Group support.  Education.  Hypnosis.  Therapy.   This information is not intended to replace advice given to you by your health care provider. Make sure you discuss any questions you have with your health care provider.   Document Released: 11/19/2003 Document Revised: 03/13/2014 Document Reviewed: 08/08/2012 Elsevier Interactive Patient Education 2016 [employer]  

## 2016-01-18 NOTE — Telephone Encounter (Signed)
Appointments scheduled per 11/14 LOS. Patient given AVS report and calendars with future scheduled appointments.

## 2016-01-18 NOTE — Progress Notes (Signed)
START ON PATHWAY REGIMEN - Non-Small Cell Lung  GBM211: Atezolizumab 1200 mg q21 Days Until Progression or Toxicity   A cycle is every 21 days:     Atezolizumab (Tecentriq(R)) 1200 mg flat dose in 250 mL NS IV over 60 minutes day 1. If first infusion is tolerated, may give subsequent doses over 30 minutes. Dose Mod: None Additional Orders: Severe immune-mediated reactions can occur (e.g. pneumonitis, colitis, and hepatitis).  See prescribing information for more details including monitoring and required immediate management with steroids. Monitor thyroid and liver  function tests at baseline and periodically during therapy.  References: Danise Mina Jahn. Elmore Guise. 2016 May 7;387(10031):1909-20 TecentriqTM [name]) prescribing information, 07/2014  **Always confirm dose/schedule in your pharmacy ordering system**    Patient Characteristics: Stage IV Metastatic, Non Squamous, Second Line - Chemotherapy/Immunotherapy, PS = 0, 1, No Prior PD-1/PD-L1  Inhibitor and Immunotherapy Candidate AJCC M Stage: 1 AJCC N Stage: 2 AJCC T Stage: 2a Current Disease Status: Distant Metastases AJCC Stage Grouping: IV Histology: Non Squamous Cell ROS1 Rearrangement Status: Quantity Not Sufficient T790M Mutation Status: Not Applicable - EGFR Mutation Negative/Unknown Other Mutations/Biomarkers: No Other Actionable Mutations PD-L1 Expression Status: Quantity Not Sufficient Chemotherapy/Immunotherapy LOT: Second Line Chemotherapy/Immunotherapy Molecular Targeted Therapy: Not Appropriate ALK Translocation Status: Quantity Not Sufficient Would you be surprised if this patient died  in the next year? I would be surprised if this patient died in the next year EGFR Mutation Status: Quantity Not Sufficient BRAF V600E Mutation Status: Quantity Not Sufficient Performance Status: PS = 0, 1 Immunotherapy Candidate Status: Candidate for Immunotherapy Prior Immunotherapy Status: No Prior PD-1/PD-L1  Inhibitor  Intent of Therapy: Non-Curative / Palliative Intent, Discussed with Patient

## 2016-01-18 NOTE — Progress Notes (Signed)
[facility] Telephone:(336) [phone]   Fax:(336) [phone]  OFFICE PROGRESS NOTE  Leonard Downing, MD [address] [place] [address]   DIAGNOSIS: Adenocarcinoma of right lung  Staging form: Lung, AJCC 7th Edition  Clinical stage from 08/13/2014: Stage IV (T2a, N2, M1a) - Signed by Curt Bears, MD on 08/15/2014  PRIOR THERAPY:  1) Systemic chemotherapy carboplatin for AUC of 5 and Alimta at 500 mg/m given every 3 weeks. Status post 6 cycles. 2) palliative radiotherapy to the right upper lobe lung mass under the care of Dr. Sondra Come completed 06/01/2015.  CURRENT THERAPY: Second line immunotherapy with Tecentriq (Atezolizumab) 1200 mg IV every 3 weeks, first dose 01/25/2016.  INTERVAL HISTORY: Steve Baker 75 y.o. male returns to the clinic today for follow-up visit accompanied by his wife. The patient is doing fine today with no specific complaints except for mild cough and shortness breath with exertion. Unfortunately he continues to smoke at regular basis. He has been observation for the last 9 months and has been doing well with no significant complaints. He denied having any fever or chills. He has no nausea or vomiting. The patient denied having any significant weight loss or night sweats. He had repeat CT scan of the chest, abdomen and pelvis performed recently and he is here for evaluation and discussion of his scan results.  MEDICAL HISTORY: Past Medical History:  Diagnosis Date  . Anemia   . Anxiety   . Brain cancer ([place])    brain metastasis  . COPD (chronic obstructive pulmonary disease) ([place])   . Coronary artery disease    s/p CABG now with 2/4 grafts patent by cath 2016 - medical management  . DCM (dilated cardiomyopathy) ([facility] [facility]) 02/18/2015  . Dysrhythmia   . Full code status 12/23/2014  . History of bronchitis   . Hyperlipidemia LDL goal <70 02/18/2015  . Hypertension    lisinopril  . Lung cancer ([place])    adenocarcinoma of right  lung  . Peripheral vascular disease ([place])    s/p aortobifemoral bypass and left profundus femoral endarterectomy with patch angioplasty and left femoral to above the knee popliteal bypass  . Prostate cancer ([place]) 2001   seeds  . Radiation 04/14/15-06/03/15   50.4 Gy to right lung with an additional boost of 12.6 Gy to right lung  . Shortness of breath dyspnea    with ambulation    ALLERGIES:  has No Known Allergies.  MEDICATIONS:  Current Outpatient Prescriptions  Medication Sig Dispense Refill  . albuterol (PROVENTIL HFA;VENTOLIN HFA) 108 (90 Base) MCG/ACT inhaler Inhale 2 puffs into the lungs every 6 (six) hours as needed for wheezing or shortness of breath. 1 Inhaler 5  . aspirin EC 81 MG tablet Take 81 mg by mouth daily.    . carvedilol (COREG) 3.125 MG tablet Take 1 tablet (3.125 mg total) by mouth 2 (two) times daily with a meal. 60 tablet 11  . lisinopril (PRINIVIL,ZESTRIL) 40 MG tablet Take 1 tablet (40 mg total) by mouth daily. 90 tablet 3  . rosuvastatin (CRESTOR) 20 MG tablet Take 1 tablet (20 mg total) by mouth daily. 30 tablet 11  . SPIRIVA RESPIMAT 2.5 MCG/ACT AERS INHALE TWO SPRAY(S) BY MOUTH ONCE DAILY 1 Inhaler 5  . spironolactone (ALDACTONE) 25 MG tablet Take 1 tablet (25 mg total) by mouth daily. 30 tablet 11   No current facility-administered medications for this visit.     SURGICAL HISTORY:  Past Surgical History:  Procedure  Laterality Date  . ABDOMINAL AORTIC ANEURYSM REPAIR    . CARDIAC CATHETERIZATION  2001  . CARDIAC CATHETERIZATION N/A 07/16/2014   Procedure: Right/Left Heart Cath and Coronary Angiography;  Surgeon: Burnell Blanks, MD;  Location: [facility];  Service: Cardiovascular;  Laterality: N/A;  . CHOLECYSTECTOMY    . COLONOSCOPY W/ POLYPECTOMY    . CORONARY ARTERY BYPASS GRAFT    . ENDARTERECTOMY FEMORAL Left 08/19/2014   Procedure: PROFUNDA FEMORIS ENDARTERECTOMY ;  Surgeon: Rosetta Posner, MD;  Location: [facility];  Service: Vascular;   Laterality: Left;  . EYE SURGERY Bilateral    cataract removal  . FEMORAL-POPLITEAL BYPASS GRAFT Left 08/19/2014   Procedure: FEMORAL-POPLITEAL ARTERY BYPASS GRAFT WITH PROPATEN GORTEX ;  Surgeon: Rosetta Posner, MD;  Location: [facility];  Service: Vascular;  Laterality: Left;  . heart bypass    . PATCH ANGIOPLASTY Left 08/19/2014   Procedure: LEFT PROFUNDA FEMORIS PATCH ANGIOPLASTY USING HEMASHIELD PLATINUM FINESSE PATCH;  Surgeon: Rosetta Posner, MD;  Location: [facility];  Service: Vascular;  Laterality: Left;  . PERIPHERAL VASCULAR CATHETERIZATION N/A 08/05/2014   Procedure: Abdominal Aortogram w/Lower Extremity;  Surgeon: Wellington Hampshire, MD;  Location: [facility];  Service: Cardiovascular;  Laterality: N/A;  . SKIN CANCER EXCISION N/A    Nose  . VIDEO BRONCHOSCOPY WITH ENDOBRONCHIAL NAVIGATION N/A 07/29/2014   Procedure: VIDEO BRONCHOSCOPY WITH ENDOBRONCHIAL NAVIGATION;  Surgeon: Collene Gobble, MD;  Location: [facility];  Service: Thoracic;  Laterality: N/A;    REVIEW OF SYSTEMS:  Constitutional: positive for fatigue Eyes: negative Ears, nose, mouth, throat, and face: negative Respiratory: positive for cough and dyspnea on exertion Cardiovascular: negative Gastrointestinal: negative Genitourinary:negative Integument/breast: negative Hematologic/lymphatic: negative Musculoskeletal:negative Neurological: negative Behavioral/Psych: negative Endocrine: negative Allergic/Immunologic: negative   PHYSICAL EXAMINATION: General appearance: alert, cooperative and no distress Head: Normocephalic, without obvious abnormality, atraumatic Neck: no adenopathy, no JVD, supple, symmetrical, trachea midline and thyroid not enlarged, symmetric, no tenderness/mass/nodules Lymph nodes: Cervical, supraclavicular, and axillary nodes normal. Resp: clear to auscultation bilaterally Back: symmetric, no curvature. ROM normal. No CVA tenderness. Cardio: regular rate and rhythm, S1, S2 normal, no murmur, click, rub  or gallop GI: soft, non-tender; bowel sounds normal; no masses,  no organomegaly Extremities: extremities normal, atraumatic, no cyanosis or edema Neurologic: Alert and oriented X 3, normal strength and tone. Normal symmetric reflexes. Normal coordination and gait  ECOG PERFORMANCE STATUS: 1 - Symptomatic but completely ambulatory  Blood pressure (!) 119/58, pulse 63, temperature 97.7 F (36.5 C), temperature source Oral, resp. rate 18, height 5' 8.5" (1.74 m), weight 206 lb 12.8 oz (93.8 kg), SpO2 98 %.  LABORATORY DATA: Lab Results  Component Value Date   WBC 4.4 01/12/2016   HGB 13.2 01/12/2016   HCT 41.0 01/12/2016   MCV 94.5 01/12/2016   PLT 231 01/12/2016      Chemistry      Component Value Date/Time   NA 138 01/12/2016 1238   K 4.5 01/12/2016 1238   CL 106 12/01/2015 0914   CO2 24 01/12/2016 1238   BUN 11.5 01/12/2016 1238   CREATININE 1.2 01/12/2016 1238      Component Value Date/Time   CALCIUM 9.6 01/12/2016 1238   ALKPHOS 115 01/12/2016 1238   AST 26 01/12/2016 1238   ALT 10 01/12/2016 1238   BILITOT 0.50 01/12/2016 1238       RADIOGRAPHIC STUDIES: Ct Chest Wo Contrast  Result Date: 01/12/2016 CLINICAL DATA:  Lung cancer. Chemotherapy complete October  2016. Radiation to the lung complete March 2017. EXAM: CT CHEST WITHOUT CONTRAST TECHNIQUE: Multidetector CT imaging of the chest was performed following the standard protocol without IV contrast. COMPARISON:  Chest CT 10/07/2015 FINDINGS: Cardiovascular: Coronary artery calcification and aortic atherosclerotic calcification. Mediastinum/Nodes: No axillary or supraclavicular adenopathy. No mediastinal hilar adenopathy no pericardial fluid esophagus normal. Lungs/Pleura: Within the medial aspect of the RIGHT lung apex there is increase in peripheral consolidation measuring 2.4 x 3.8 cm compared to 2.4 x 1.6 cm. At RIGHT lower lobe is clear. Within the LEFT lower lobe, nodular density is increased in size measuring 2.1  by 0.9 cm compared to 1.7 by 10.0 cm on prior remeasured. On coronal imaging lesion measures 9 mm (image 106, series 4.  8 mm Upper Abdomen: Limited view of the liver, kidneys, pancreas are unremarkable. Normal adrenal glands. IMPRESSION: 1. Increased consolidation at the treatment site in the RIGHT lung apex is atypical pattern for typical postradiation change. Recommend close attention on CT follow-up versus FDG PET scan. Recommend follow-up CT with IV contrast evaluate for small AVM seen on CT 10/07/2015 2. Increased in thickness of nodular density in medial aspect of the LEFT lower lobe is concerning for bronchogenic carcinoma. Site of prior larger nodule at this level 06/29/2014. Consider FDG PET scan for further evaluation. Electronically Signed   By: Suzy Bouchard M.D.   On: 01/12/2016 15:21    ASSESSMENT AND PLAN: This is a very pleasant 75 years old white male with a stage IV non-small cell lung cancer status post 6 cycles of systemic chemotherapy with carboplatin and Alimta. The patient tolerated his current treatment fairly well with no significant adverse effect except for mild fatigue. This was followed by palliative radiotherapy to the right upper lobe lung mass under the care of Dr. Sondra Come. The recent restaging scan of the chest showed evidence for progression and the right lung apical mass as well as increased sickness in the medial aspect of the right lower lobe concerning for bronchogenic carcinoma. I personally reviewed the imaging studies. I discussed the results with the patient and his wife today. I gave him the option of continuous observation close monitoring versus treatment with second line option with immunotherapy with Tecentriq [name]). The patient is interested in proceeding with the immunotherapy. I discussed with him the adverse effect of this treatment including but not limited to immune mediated his skin rash, diarrhea, inflammation of the lung, kidney, liver,  thyroid or other endocrine dysfunction. He is expected to start the first dose of this treatment on 01/25/2016. I will see the patient back for follow-up visit in 4 weeks with the start of cycle #2. For smoke cessation, I strongly encouraged the patient to quit smoking and offered him a smoke cessation program. The patient was advised to call immediately if he has any concerning symptoms in the interval. The patient voices understanding of current disease status and treatment options and is in agreement with the current care plan.  All questions were answered. The patient knows to call the clinic with any problems, questions or concerns. We can certainly see the patient much sooner if necessary.  Disclaimer: This note was dictated with voice recognition software. Similar sounding words can inadvertently be transcribed and may not be corrected upon review.

## 2016-01-19 ENCOUNTER — Telehealth: Payer: Self-pay | Admitting: *Deleted

## 2016-01-19 NOTE — Telephone Encounter (Signed)
Per LOS I have scheduled appts and notified the scheduler 

## 2016-01-21 ENCOUNTER — Other Ambulatory Visit (HOSPITAL_BASED_OUTPATIENT_CLINIC_OR_DEPARTMENT_OTHER): Payer: Medicare Other

## 2016-01-21 DIAGNOSIS — C3411 Malignant neoplasm of upper lobe, right bronchus or lung: Secondary | ICD-10-CM

## 2016-01-21 DIAGNOSIS — C3491 Malignant neoplasm of unspecified part of right bronchus or lung: Secondary | ICD-10-CM

## 2016-01-21 DIAGNOSIS — I10 Essential (primary) hypertension: Secondary | ICD-10-CM

## 2016-01-21 LAB — CBC WITH DIFFERENTIAL/PLATELET
BASO%: 0.2 % (ref 0.0–2.0)
BASOS ABS: 0 10*3/uL (ref 0.0–0.1)
EOS ABS: 0.3 10*3/uL (ref 0.0–0.5)
EOS%: 6.6 % (ref 0.0–7.0)
HEMATOCRIT: 38.4 % (ref 38.4–49.9)
HEMOGLOBIN: 12.4 g/dL — AB (ref 13.0–17.1)
LYMPH#: 0.5 10*3/uL — AB (ref 0.9–3.3)
LYMPH%: 12.3 % — ABNORMAL LOW (ref 14.0–49.0)
MCH: 30.2 pg (ref 27.2–33.4)
MCHC: 32.3 g/dL (ref 32.0–36.0)
MCV: 93.7 fL (ref 79.3–98.0)
MONO#: 0.4 10*3/uL (ref 0.1–0.9)
MONO%: 9.3 % (ref 0.0–14.0)
NEUT%: 71.6 % (ref 39.0–75.0)
NEUTROS ABS: 3.2 10*3/uL (ref 1.5–6.5)
Platelets: 201 10*3/uL (ref 140–400)
RBC: 4.1 10*6/uL — ABNORMAL LOW (ref 4.20–5.82)
RDW: 14.2 % (ref 11.0–14.6)
WBC: 4.4 10*3/uL (ref 4.0–10.3)

## 2016-01-21 LAB — COMPREHENSIVE METABOLIC PANEL
ALBUMIN: 3.2 g/dL — AB (ref 3.5–5.0)
ALK PHOS: 102 U/L (ref 40–150)
ALT: 10 U/L (ref 0–55)
AST: 27 U/L (ref 5–34)
Anion Gap: 8 mEq/L (ref 3–11)
BILIRUBIN TOTAL: 0.31 mg/dL (ref 0.20–1.20)
BUN: 15 mg/dL (ref 7.0–26.0)
CALCIUM: 9.3 mg/dL (ref 8.4–10.4)
CO2: 25 mEq/L (ref 22–29)
Chloride: 108 mEq/L (ref 98–109)
Creatinine: 1.3 mg/dL (ref 0.7–1.3)
EGFR: 53 mL/min/{1.73_m2} — AB (ref >90)
GLUCOSE: 102 mg/dL (ref 70–140)
Potassium: 4.6 mEq/L (ref 3.5–5.1)
SODIUM: 140 meq/L (ref 136–145)
TOTAL PROTEIN: 6.6 g/dL (ref 6.4–8.3)

## 2016-01-21 LAB — TSH: TSH: 3.333 m[IU]/L (ref 0.320–4.118)

## 2016-01-24 ENCOUNTER — Ambulatory Visit: Payer: Medicare Other | Admitting: Oncology

## 2016-01-25 ENCOUNTER — Ambulatory Visit (HOSPITAL_BASED_OUTPATIENT_CLINIC_OR_DEPARTMENT_OTHER): Payer: Medicare Other

## 2016-01-25 VITALS — BP 136/63 | HR 65 | Temp 98.0°F | Resp 16

## 2016-01-25 DIAGNOSIS — Z5112 Encounter for antineoplastic immunotherapy: Secondary | ICD-10-CM | POA: Diagnosis not present

## 2016-01-25 DIAGNOSIS — Z23 Encounter for immunization: Secondary | ICD-10-CM | POA: Diagnosis not present

## 2016-01-25 DIAGNOSIS — C3411 Malignant neoplasm of upper lobe, right bronchus or lung: Secondary | ICD-10-CM

## 2016-01-25 DIAGNOSIS — C3491 Malignant neoplasm of unspecified part of right bronchus or lung: Secondary | ICD-10-CM

## 2016-01-25 MED ORDER — INFLUENZA VAC SPLIT QUAD 0.5 ML IM SUSY
0.5000 mL | PREFILLED_SYRINGE | Freq: Once | INTRAMUSCULAR | Status: AC
  Administered 2016-01-25: 0.5 mL via INTRAMUSCULAR
  Filled 2016-01-25: qty 0.5

## 2016-01-25 MED ORDER — ATEZOLIZUMAB CHEMO INJECTION 1200 MG/20ML
1200.0000 mg | Freq: Once | INTRAVENOUS | Status: AC
  Administered 2016-01-25: 1200 mg via INTRAVENOUS
  Filled 2016-01-25: qty 20

## 2016-01-25 MED ORDER — SODIUM CHLORIDE 0.9 % IV SOLN
Freq: Once | INTRAVENOUS | Status: AC
  Administered 2016-01-25: 09:00:00 via INTRAVENOUS

## 2016-01-25 NOTE — Patient Instructions (Signed)
[facility] Discharge Instructions for Patients Receiving Chemotherapy  Today you received the following chemotherapy agents; Atezolizumab (Tecentriq)   BELOW ARE SYMPTOMS THAT SHOULD BE REPORTED IMMEDIATELY:  *FEVER GREATER THAN 100.5 F  *CHILLS WITH OR WITHOUT FEVER  NAUSEA AND VOMITING THAT IS NOT CONTROLLED WITH YOUR NAUSEA MEDICATION  *UNUSUAL SHORTNESS OF BREATH  *UNUSUAL BRUISING OR BLEEDING  TENDERNESS IN MOUTH AND THROAT WITH OR WITHOUT PRESENCE OF ULCERS  *URINARY PROBLEMS  *BOWEL PROBLEMS  UNUSUAL RASH Items with * indicate a potential emergency and should be followed up as soon as possible.  Feel free to call the clinic you have any questions or concerns. The clinic phone number is [phone].  Please show the [facility] at check-in to the Emergency Department and triage nurse.

## 2016-02-15 ENCOUNTER — Ambulatory Visit (HOSPITAL_BASED_OUTPATIENT_CLINIC_OR_DEPARTMENT_OTHER): Payer: Medicare Other | Admitting: Internal Medicine

## 2016-02-15 ENCOUNTER — Ambulatory Visit: Payer: Medicare Other

## 2016-02-15 ENCOUNTER — Other Ambulatory Visit (HOSPITAL_BASED_OUTPATIENT_CLINIC_OR_DEPARTMENT_OTHER): Payer: Medicare Other

## 2016-02-15 ENCOUNTER — Encounter: Payer: Self-pay | Admitting: Internal Medicine

## 2016-02-15 ENCOUNTER — Ambulatory Visit (HOSPITAL_BASED_OUTPATIENT_CLINIC_OR_DEPARTMENT_OTHER): Payer: Medicare Other

## 2016-02-15 ENCOUNTER — Telehealth: Payer: Self-pay | Admitting: Internal Medicine

## 2016-02-15 ENCOUNTER — Other Ambulatory Visit: Payer: Self-pay | Admitting: Medical Oncology

## 2016-02-15 DIAGNOSIS — E875 Hyperkalemia: Secondary | ICD-10-CM

## 2016-02-15 DIAGNOSIS — J449 Chronic obstructive pulmonary disease, unspecified: Secondary | ICD-10-CM | POA: Diagnosis not present

## 2016-02-15 DIAGNOSIS — I10 Essential (primary) hypertension: Secondary | ICD-10-CM | POA: Diagnosis not present

## 2016-02-15 DIAGNOSIS — C3411 Malignant neoplasm of upper lobe, right bronchus or lung: Secondary | ICD-10-CM

## 2016-02-15 DIAGNOSIS — Z79899 Other long term (current) drug therapy: Secondary | ICD-10-CM

## 2016-02-15 DIAGNOSIS — C3491 Malignant neoplasm of unspecified part of right bronchus or lung: Secondary | ICD-10-CM

## 2016-02-15 DIAGNOSIS — D6481 Anemia due to antineoplastic chemotherapy: Secondary | ICD-10-CM | POA: Diagnosis not present

## 2016-02-15 DIAGNOSIS — Z5112 Encounter for antineoplastic immunotherapy: Secondary | ICD-10-CM | POA: Diagnosis not present

## 2016-02-15 DIAGNOSIS — J438 Other emphysema: Secondary | ICD-10-CM

## 2016-02-15 LAB — CBC WITH DIFFERENTIAL/PLATELET
BASO%: 0.8 % (ref 0.0–2.0)
Basophils Absolute: 0 10*3/uL (ref 0.0–0.1)
EOS%: 5.4 % (ref 0.0–7.0)
Eosinophils Absolute: 0.3 10*3/uL (ref 0.0–0.5)
HCT: 35.9 % — ABNORMAL LOW (ref 38.4–49.9)
HGB: 11.2 g/dL — ABNORMAL LOW (ref 13.0–17.1)
LYMPH#: 0.8 10*3/uL — AB (ref 0.9–3.3)
LYMPH%: 15.9 % (ref 14.0–49.0)
MCH: 28.6 pg (ref 27.2–33.4)
MCHC: 31.2 g/dL — AB (ref 32.0–36.0)
MCV: 91.6 fL (ref 79.3–98.0)
MONO#: 0.6 10*3/uL (ref 0.1–0.9)
MONO%: 10.9 % (ref 0.0–14.0)
NEUT%: 67 % (ref 39.0–75.0)
NEUTROS ABS: 3.5 10*3/uL (ref 1.5–6.5)
PLATELETS: 211 10*3/uL (ref 140–400)
RBC: 3.92 10*6/uL — ABNORMAL LOW (ref 4.20–5.82)
RDW: 14.9 % — ABNORMAL HIGH (ref 11.0–14.6)
WBC: 5.2 10*3/uL (ref 4.0–10.3)

## 2016-02-15 LAB — COMPREHENSIVE METABOLIC PANEL
ALT: 9 U/L (ref 0–55)
AST: 24 U/L (ref 5–34)
Albumin: 3.3 g/dL — ABNORMAL LOW (ref 3.5–5.0)
Alkaline Phosphatase: 85 U/L (ref 40–150)
Anion Gap: 10 mEq/L (ref 3–11)
BILIRUBIN TOTAL: 0.4 mg/dL (ref 0.20–1.20)
BUN: 18 mg/dL (ref 7.0–26.0)
CALCIUM: 9.4 mg/dL (ref 8.4–10.4)
CHLORIDE: 105 meq/L (ref 98–109)
CO2: 23 meq/L (ref 22–29)
CREATININE: 1.4 mg/dL — AB (ref 0.7–1.3)
EGFR: 48 mL/min/{1.73_m2} — ABNORMAL LOW (ref >90)
Glucose: 93 mg/dl (ref 70–140)
Potassium: 4.8 mEq/L (ref 3.5–5.1)
Sodium: 138 mEq/L (ref 136–145)
TOTAL PROTEIN: 6.7 g/dL (ref 6.4–8.3)

## 2016-02-15 LAB — TSH: TSH: 3.288 m[IU]/L (ref 0.320–4.118)

## 2016-02-15 MED ORDER — SODIUM CHLORIDE 0.9 % IV SOLN
Freq: Once | INTRAVENOUS | Status: AC
  Administered 2016-02-15: 15:00:00 via INTRAVENOUS

## 2016-02-15 MED ORDER — SODIUM CHLORIDE 0.9 % IV SOLN
1200.0000 mg | Freq: Once | INTRAVENOUS | Status: AC
  Administered 2016-02-15: 1200 mg via INTRAVENOUS
  Filled 2016-02-15: qty 20

## 2016-02-15 NOTE — Progress Notes (Signed)
[facility] [phone]   Fax:([phone]  OFFICE PROGRESS NOTE  Leonard Downing, MD [place] [address]  DIAGNOSIS: Stage IV (T2a, N2, M1a) non-small cell lung cancer, adenocarcinoma diagnosed in June 2016.  PRIOR THERAPY: 1) Systemic chemotherapy carboplatin for AUC of 5 and Alimta at 500 mg/m given every 3 weeks. Status post 6 cycles. 2) palliative radiotherapy to the right upper lobe lung mass under the care of Dr. Sondra Come completed 06/01/2015.  CURRENT THERAPY: Second line immunotherapy with Tecentriq (Atezolizumab) 1200 mg IV every 3 weeks, first dose 01/25/2016. Status post one cycle.  INTERVAL HISTORY: Steve Baker 75 y.o. male returns to the clinic today for follow-up visit accompanied by his wife. The patient is a very pleasant 38 years old white male with a stage IV non-small cell lung cancer, adenocarcinoma was currently undergoing second line treatment with immunotherapy with Tecentriq [name]) status post 1 cycle. He tolerated the first cycle of his treatment well with no significant adverse effects. He denied having any skin rash or diarrhea. He has no fever or chills. He denied having any significant chest pain but continues to have shortness of breath with exertion and no cough or hemoptysis. He has no significant weight loss or night sweats. He is here today for evaluation before starting cycle #2.  MEDICAL HISTORY: Past Medical History:  Diagnosis Date  . Anemia   . Anxiety   . Brain cancer ([facility])    brain metastasis  . COPD (chronic obstructive pulmonary disease) ([facility])   . Coronary artery disease    s/p CABG now with 2/4 grafts patent by cath 2016 - medical management  . DCM (dilated cardiomyopathy) ([facility]) 02/18/2015  . Dysrhythmia   . Encounter for antineoplastic immunotherapy 01/18/2016  . Full code status 12/23/2014  . History of bronchitis   . Hyperlipidemia LDL goal <70 02/18/2015  .  Hypertension    lisinopril  . Lung cancer ([facility])    adenocarcinoma of right lung  . Peripheral vascular disease ([facility])    s/p aortobifemoral bypass and left profundus femoral endarterectomy with patch angioplasty and left femoral to above the knee popliteal bypass  . Prostate cancer ([facility]) 2001   seeds  . Radiation 04/14/15-06/03/15   50.4 Gy to right lung with an additional boost of 12.6 Gy to right lung  . Shortness of breath dyspnea    with ambulation    ALLERGIES:  has No Known Allergies.  MEDICATIONS:  Current Outpatient Prescriptions  Medication Sig Dispense Refill  . albuterol (PROVENTIL HFA;VENTOLIN HFA) 108 (90 Base) MCG/ACT inhaler Inhale 2 puffs into the lungs every 6 (six) hours as needed for wheezing or shortness of breath. 1 Inhaler 5  . aspirin EC 81 MG tablet Take 81 mg by mouth daily.    . carvedilol (COREG) 3.125 MG tablet Take 1 tablet (3.125 mg total) by mouth 2 (two) times daily with a meal. 60 tablet 11  . lisinopril (PRINIVIL,ZESTRIL) 40 MG tablet Take 1 tablet (40 mg total) by mouth daily. 90 tablet 3  . rosuvastatin (CRESTOR) 20 MG tablet Take 1 tablet (20 mg total) by mouth daily. 30 tablet 11  . SPIRIVA RESPIMAT 2.5 MCG/ACT AERS INHALE TWO SPRAY(S) BY MOUTH ONCE DAILY 1 Inhaler 5  . spironolactone (ALDACTONE) 25 MG tablet Take 1 tablet (25 mg total) by mouth daily. 30 tablet 11   No current facility-administered medications for this visit.     SURGICAL HISTORY:  Past Surgical  History:  Procedure Laterality Date  . ABDOMINAL AORTIC ANEURYSM REPAIR    . CARDIAC CATHETERIZATION  2001  . CARDIAC CATHETERIZATION N/A 07/16/2014   Procedure: Right/Left Heart Cath and Coronary Angiography;  Surgeon: Burnell Blanks, MD;  Location: [facility];  Service: Cardiovascular;  Laterality: N/A;  . CHOLECYSTECTOMY    . COLONOSCOPY W/ POLYPECTOMY    . CORONARY ARTERY BYPASS GRAFT    . ENDARTERECTOMY FEMORAL Left 08/19/2014   Procedure: PROFUNDA FEMORIS  ENDARTERECTOMY ;  Surgeon: Rosetta Posner, MD;  Location: [facility];  Service: Vascular;  Laterality: Left;  . EYE SURGERY Bilateral    cataract removal  . FEMORAL-POPLITEAL BYPASS GRAFT Left 08/19/2014   Procedure: FEMORAL-POPLITEAL ARTERY BYPASS GRAFT WITH PROPATEN GORTEX ;  Surgeon: Rosetta Posner, MD;  Location: [facility];  Service: Vascular;  Laterality: Left;  . heart bypass    . PATCH ANGIOPLASTY Left 08/19/2014   Procedure: LEFT PROFUNDA FEMORIS PATCH ANGIOPLASTY USING HEMASHIELD PLATINUM FINESSE PATCH;  Surgeon: Rosetta Posner, MD;  Location: [facility];  Service: Vascular;  Laterality: Left;  . PERIPHERAL VASCULAR CATHETERIZATION N/A 08/05/2014   Procedure: Abdominal Aortogram w/Lower Extremity;  Surgeon: Wellington Hampshire, MD;  Location: [facility];  Service: Cardiovascular;  Laterality: N/A;  . SKIN CANCER EXCISION N/A    Nose  . VIDEO BRONCHOSCOPY WITH ENDOBRONCHIAL NAVIGATION N/A 07/29/2014   Procedure: VIDEO BRONCHOSCOPY WITH ENDOBRONCHIAL NAVIGATION;  Surgeon: Collene Gobble, MD;  Location: [facility];  Service: Thoracic;  Laterality: N/A;    REVIEW OF SYSTEMS:  Constitutional: positive for fatigue Eyes: negative Ears, nose, mouth, throat, and face: negative Respiratory: positive for dyspnea on exertion Cardiovascular: negative Gastrointestinal: negative Genitourinary:negative Integument/breast: negative Hematologic/lymphatic: negative Musculoskeletal:negative Neurological: negative Behavioral/Psych: negative Endocrine: negative Allergic/Immunologic: negative   PHYSICAL EXAMINATION: General appearance: alert, cooperative, fatigued and no distress Head: Normocephalic, without obvious abnormality, atraumatic Neck: no adenopathy, no JVD, supple, symmetrical, trachea midline and thyroid not enlarged, symmetric, no tenderness/mass/nodules Lymph nodes: Cervical, supraclavicular, and axillary nodes normal. Resp: clear to auscultation bilaterally Back: symmetric, no curvature. ROM normal. No  CVA tenderness. Cardio: regular rate and rhythm, S1, S2 normal, no murmur, click, rub or gallop GI: soft, non-tender; bowel sounds normal; no masses,  no organomegaly Extremities: extremities normal, atraumatic, no cyanosis or edema Neurologic: Alert and oriented X 3, normal strength and tone. Normal symmetric reflexes. Normal coordination and gait  ECOG PERFORMANCE STATUS: 1 - Symptomatic but completely ambulatory  There were no vitals taken for this visit.  LABORATORY DATA: Lab Results  Component Value Date   WBC 5.2 02/15/2016   HGB 11.2 (L) 02/15/2016   HCT 35.9 (L) 02/15/2016   MCV 91.6 02/15/2016   PLT 211 02/15/2016      Chemistry      Component Value Date/Time   NA 140 01/21/2016 1023   K 4.6 01/21/2016 1023   CL 106 12/01/2015 0914   CO2 25 01/21/2016 1023   BUN 15.0 01/21/2016 1023   CREATININE 1.3 01/21/2016 1023      Component Value Date/Time   CALCIUM 9.3 01/21/2016 1023   ALKPHOS 102 01/21/2016 1023   AST 27 01/21/2016 1023   ALT 10 01/21/2016 1023   BILITOT 0.31 01/21/2016 1023       RADIOGRAPHIC STUDIES: No results found.  ASSESSMENT AND PLAN: This is a very pleasant 75 years old white male with: 1) stage IV non-small cell lung cancer, adenocarcinoma. He is currently undergoing second line treatment with immunotherapy with  Tecentriq (Atezolizumab) status post 1 cycle. He tolerated the first cycle of his treatment well with no significant adverse effects. I recommended for the patient to proceed with cycle #2 today as scheduled. He would come back for follow-up visit in 3 weeks for evaluation before starting cycle #3. 2) COPD: The patient will continue his current treatment with Spiriva 3) hypertension: Stable. I recommended for the patient to continue his current treatment with lisinopril and carvedilol. 4) chemotherapy-induced anemia: His hemoglobin and hematocrit are stable. I will continue to monitor it closely. The patient was advised to call  immediately if she has any concerning symptoms in the interval.  The patient voices understanding of current disease status and treatment options and is in agreement with the current care plan.  All questions were answered. The patient knows to call the clinic with any problems, questions or concerns. We can certainly see the patient much sooner if necessary.  Disclaimer: This note was dictated with voice recognition software. Similar sounding words can inadvertently be transcribed and may not be corrected upon review.

## 2016-02-15 NOTE — Telephone Encounter (Signed)
Appointments scheduled per 12/12 LOS. Patient given AVS report and calendars with future scheduled appointments.  °

## 2016-02-15 NOTE — Patient Instructions (Signed)
[facility] Discharge Instructions for Patients Receiving Chemotherapy  Today you received the following chemotherapy agents: Tecentriq  To help prevent nausea and vomiting after your treatment, we encourage you to take your nausea medication as directed.    If you develop nausea and vomiting that is not controlled by your nausea medication, call the clinic.   BELOW ARE SYMPTOMS THAT SHOULD BE REPORTED IMMEDIATELY:  *FEVER GREATER THAN 100.5 F  *CHILLS WITH OR WITHOUT FEVER  NAUSEA AND VOMITING THAT IS NOT CONTROLLED WITH YOUR NAUSEA MEDICATION  *UNUSUAL SHORTNESS OF BREATH  *UNUSUAL BRUISING OR BLEEDING  TENDERNESS IN MOUTH AND THROAT WITH OR WITHOUT PRESENCE OF ULCERS  *URINARY PROBLEMS  *BOWEL PROBLEMS  UNUSUAL RASH Items with * indicate a potential emergency and should be followed up as soon as possible.  Feel free to call the clinic you have any questions or concerns. The clinic phone number is [phone].  Please show the CHEMO ALERT CARD at check-in to the Emergency Department and triage nurse.   

## 2016-02-15 NOTE — Patient Instructions (Signed)
Steps to Quit Smoking Smoking tobacco can be bad for your health. It can also affect almost every organ in your body. Smoking puts you and people around you at risk for many serious long-lasting (chronic) diseases. Quitting smoking is hard, but it is one of the best things that you can do for your health. It is never too late to quit. What are the benefits of quitting smoking? When you quit smoking, you lower your risk for getting serious diseases and conditions. They can include:  Lung cancer or lung disease.  Heart disease.  Stroke.  Heart attack.  Not being able to have children (infertility).  Weak bones (osteoporosis) and broken bones (fractures). If you have coughing, wheezing, and shortness of breath, those symptoms may get better when you quit. You may also get sick less often. If you are pregnant, quitting smoking can help to lower your chances of having a baby of low birth weight. What can I do to help me quit smoking? Talk with your doctor about what can help you quit smoking. Some things you can do (strategies) include:  Quitting smoking totally, instead of slowly cutting back how much you smoke over a period of time.  Going to in-person counseling. You are more likely to quit if you go to many counseling sessions.  Using resources and support systems, such as:  Online chats with a counselor.  Phone quitlines.  Printed self-help materials.  Support groups or group counseling.  Text messaging programs.  Mobile phone apps or applications.  Taking medicines. Some of these medicines may have nicotine in them. If you are pregnant or breastfeeding, do not take any medicines to quit smoking unless your doctor says it is okay. Talk with your doctor about counseling or other things that can help you. Talk with your doctor about using more than one strategy at the same time, such as taking medicines while you are also going to in-person counseling. This can help make quitting  easier. What things can I do to make it easier to quit? Quitting smoking might feel very hard at first, but there is a lot that you can do to make it easier. Take these steps:  Talk to your family and friends. Ask them to support and encourage you.  Call phone quitlines, reach out to support groups, or work with a counselor.  Ask people who smoke to not smoke around you.  Avoid places that make you want (trigger) to smoke, such as:  Bars.  Parties.  Smoke-break areas at work.  Spend time with people who do not smoke.  Lower the stress in your life. Stress can make you want to smoke. Try these things to help your stress:  Getting regular exercise.  Deep-breathing exercises.  Yoga.  Meditating.  Doing a body scan. To do this, close your eyes, focus on one area of your body at a time from head to toe, and notice which parts of your body are tense. Try to relax the muscles in those areas.  Download or buy apps on your mobile phone or tablet that can help you stick to your quit plan. There are many free apps, such as QuitGuide from the CDC (Centers for Disease Control and Prevention). You can find more support from smokefree.gov and other websites. This information is not intended to replace advice given to you by your health care provider. Make sure you discuss any questions you have with your health care provider. Document Released: 12/17/2008 Document Revised: 10/19/2015 Document   Reviewed: 07/07/2014 Elsevier Interactive Patient Education  2017 Elsevier Inc.  

## 2016-02-16 ENCOUNTER — Telehealth: Payer: Self-pay | Admitting: *Deleted

## 2016-02-16 NOTE — Telephone Encounter (Signed)
Per LOS I have scheduled appts and notified the scheduler 

## 2016-02-22 ENCOUNTER — Other Ambulatory Visit: Payer: Self-pay | Admitting: Cardiology

## 2016-03-07 ENCOUNTER — Ambulatory Visit (HOSPITAL_BASED_OUTPATIENT_CLINIC_OR_DEPARTMENT_OTHER): Payer: Medicare Other | Admitting: Internal Medicine

## 2016-03-07 ENCOUNTER — Other Ambulatory Visit (HOSPITAL_BASED_OUTPATIENT_CLINIC_OR_DEPARTMENT_OTHER): Payer: Medicare Other

## 2016-03-07 ENCOUNTER — Ambulatory Visit (HOSPITAL_BASED_OUTPATIENT_CLINIC_OR_DEPARTMENT_OTHER): Payer: Medicare Other

## 2016-03-07 ENCOUNTER — Encounter: Payer: Self-pay | Admitting: Internal Medicine

## 2016-03-07 ENCOUNTER — Telehealth: Payer: Self-pay | Admitting: Internal Medicine

## 2016-03-07 VITALS — BP 100/56 | HR 71 | Temp 97.6°F | Resp 18 | Wt 205.5 lb

## 2016-03-07 DIAGNOSIS — C3491 Malignant neoplasm of unspecified part of right bronchus or lung: Secondary | ICD-10-CM

## 2016-03-07 DIAGNOSIS — Z5112 Encounter for antineoplastic immunotherapy: Secondary | ICD-10-CM | POA: Diagnosis not present

## 2016-03-07 DIAGNOSIS — C3411 Malignant neoplasm of upper lobe, right bronchus or lung: Secondary | ICD-10-CM

## 2016-03-07 DIAGNOSIS — I10 Essential (primary) hypertension: Secondary | ICD-10-CM | POA: Diagnosis not present

## 2016-03-07 DIAGNOSIS — R05 Cough: Secondary | ICD-10-CM

## 2016-03-07 DIAGNOSIS — R0602 Shortness of breath: Secondary | ICD-10-CM | POA: Diagnosis not present

## 2016-03-07 DIAGNOSIS — R093 Abnormal sputum: Secondary | ICD-10-CM

## 2016-03-07 DIAGNOSIS — J438 Other emphysema: Secondary | ICD-10-CM

## 2016-03-07 LAB — COMPREHENSIVE METABOLIC PANEL
ALT: 8 U/L (ref 0–55)
ANION GAP: 9 meq/L (ref 3–11)
AST: 26 U/L (ref 5–34)
Albumin: 3.4 g/dL — ABNORMAL LOW (ref 3.5–5.0)
Alkaline Phosphatase: 112 U/L (ref 40–150)
BILIRUBIN TOTAL: 0.37 mg/dL (ref 0.20–1.20)
BUN: 14.4 mg/dL (ref 7.0–26.0)
CHLORIDE: 103 meq/L (ref 98–109)
CO2: 23 meq/L (ref 22–29)
Calcium: 9.4 mg/dL (ref 8.4–10.4)
Creatinine: 1.2 mg/dL (ref 0.7–1.3)
EGFR: 59 mL/min/{1.73_m2} — AB (ref >90)
Glucose: 100 mg/dl (ref 70–140)
Potassium: 4.5 mEq/L (ref 3.5–5.1)
Sodium: 135 mEq/L — ABNORMAL LOW (ref 136–145)
Total Protein: 6.9 g/dL (ref 6.4–8.3)

## 2016-03-07 LAB — CBC WITH DIFFERENTIAL/PLATELET
BASO%: 0.3 % (ref 0.0–2.0)
Basophils Absolute: 0 10*3/uL (ref 0.0–0.1)
EOS ABS: 0.4 10*3/uL (ref 0.0–0.5)
EOS%: 7 % (ref 0.0–7.0)
HCT: 34.2 % — ABNORMAL LOW (ref 38.4–49.9)
HGB: 10.7 g/dL — ABNORMAL LOW (ref 13.0–17.1)
LYMPH%: 12.3 % — AB (ref 14.0–49.0)
MCH: 27.6 pg (ref 27.2–33.4)
MCHC: 31.3 g/dL — AB (ref 32.0–36.0)
MCV: 88.1 fL (ref 79.3–98.0)
MONO#: 0.8 10*3/uL (ref 0.1–0.9)
MONO%: 12.9 % (ref 0.0–14.0)
NEUT#: 3.9 10*3/uL (ref 1.5–6.5)
NEUT%: 67.5 % (ref 39.0–75.0)
PLATELETS: 269 10*3/uL (ref 140–400)
RBC: 3.88 10*6/uL — ABNORMAL LOW (ref 4.20–5.82)
RDW: 15.5 % — ABNORMAL HIGH (ref 11.0–14.6)
WBC: 5.8 10*3/uL (ref 4.0–10.3)
lymph#: 0.7 10*3/uL — ABNORMAL LOW (ref 0.9–3.3)

## 2016-03-07 LAB — TSH: TSH: 3.572 m(IU)/L (ref 0.320–4.118)

## 2016-03-07 MED ORDER — ATEZOLIZUMAB CHEMO INJECTION 1200 MG/20ML
1200.0000 mg | Freq: Once | INTRAVENOUS | Status: AC
  Administered 2016-03-07: 1200 mg via INTRAVENOUS
  Filled 2016-03-07: qty 20

## 2016-03-07 MED ORDER — SODIUM CHLORIDE 0.9 % IV SOLN
Freq: Once | INTRAVENOUS | Status: AC
  Administered 2016-03-07: 14:00:00 via INTRAVENOUS

## 2016-03-07 NOTE — Patient Instructions (Signed)
[facility] Discharge Instructions for Patients Receiving Chemotherapy  Today you received the following chemotherapy agents: Tecentriq  To help prevent nausea and vomiting after your treatment, we encourage you to take your nausea medication as directed.    If you develop nausea and vomiting that is not controlled by your nausea medication, call the clinic.   BELOW ARE SYMPTOMS THAT SHOULD BE REPORTED IMMEDIATELY:  *FEVER GREATER THAN 100.5 F  *CHILLS WITH OR WITHOUT FEVER  NAUSEA AND VOMITING THAT IS NOT CONTROLLED WITH YOUR NAUSEA MEDICATION  *UNUSUAL SHORTNESS OF BREATH  *UNUSUAL BRUISING OR BLEEDING  TENDERNESS IN MOUTH AND THROAT WITH OR WITHOUT PRESENCE OF ULCERS  *URINARY PROBLEMS  *BOWEL PROBLEMS  UNUSUAL RASH Items with * indicate a potential emergency and should be followed up as soon as possible.  Feel free to call the clinic you have any questions or concerns. The clinic phone number is [phone].  Please show the CHEMO ALERT CARD at check-in to the Emergency Department and triage nurse.   

## 2016-03-07 NOTE — Progress Notes (Signed)
[name] [phone]   [phone]  OFFICE PROGRESS NOTE  Steve Downing, MD Steve Baker [id number]  DIAGNOSIS: Stage IV (T2a, N2, M1a) non-small cell lung cancer, adenocarcinoma diagnosed in June 2016.  PRIOR THERAPY: 1) Systemic chemotherapy carboplatin for AUC of 5 and Alimta at 500 mg/m given every 3 weeks. Status post 6 cycles. 2) palliative radiotherapy to the right upper lobe lung mass under the care of Dr. Sondra Come completed 06/01/2015.  CURRENT THERAPY: Second line immunotherapy with Tecentriq (Atezolizumab) 1200 mg IV every 3 weeks, first dose 01/25/2016. Status post 2 cycles.  INTERVAL HISTORY: Steve Baker 76 y.o. male returns to the clinic today for follow-up visit accompanied by his wife. The patient is currently undergoing treatment with Tecentriq Steve Baker) status post 2 cycles and tolerating his treatment fairly well. He continues to have cough productive of whitish sputum and shortness of breath with exertion. He denied having any chest pain or hemoptysis. He has no significant weight loss or night sweats. He denied having any fever or chills. He is here today for evaluation before starting cycle #3.  MEDICAL HISTORY: Past Medical History:  Diagnosis Date  . Anemia   . Anxiety   . Brain cancer ([place])    brain metastasis  . COPD (chronic obstructive pulmonary disease) ([place])   . Coronary artery disease    s/p CABG now with 2/4 grafts patent by cath 2016 - medical management  . DCM (dilated cardiomyopathy) ([place]) 02/18/2015  . Dysrhythmia   . Encounter for antineoplastic immunotherapy 01/18/2016  . Full code status 12/23/2014  . History of bronchitis   . Hyperlipidemia LDL goal <70 02/18/2015  . Hypertension    lisinopril  . Lung cancer ([place])    adenocarcinoma of right lung  . Peripheral vascular disease ([place])    s/p aortobifemoral bypass and left profundus femoral endarterectomy with patch  angioplasty and left femoral to above the knee popliteal bypass  . Prostate cancer ([place]) 2001   seeds  . Radiation 04/14/15-06/03/15   50.4 Gy to right lung with an additional boost of 12.6 Gy to right lung  . Shortness of breath dyspnea    with ambulation    ALLERGIES:  has No Known Allergies.  MEDICATIONS:  Current Outpatient Prescriptions  Medication Sig Dispense Refill  . albuterol (PROVENTIL HFA;VENTOLIN HFA) 108 (90 Base) MCG/ACT inhaler Inhale 2 puffs into the lungs every 6 (six) hours as needed for wheezing or shortness of breath. 1 Inhaler 5  . aspirin EC 81 MG tablet Take 81 mg by mouth daily.    . carvedilol (COREG) 3.125 MG tablet TAKE ONE TABLET BY MOUTH TWICE DAILY WITH A MEAL 180 tablet 2  . lisinopril (PRINIVIL,ZESTRIL) 40 MG tablet Take 1 tablet (40 mg total) by mouth daily. 90 tablet 3  . rosuvastatin (CRESTOR) 20 MG tablet Take 1 tablet (20 mg total) by mouth daily. 30 tablet 11  . SPIRIVA RESPIMAT 2.5 MCG/ACT AERS INHALE TWO SPRAY(S) BY MOUTH ONCE DAILY 1 Inhaler 5  . spironolactone (ALDACTONE) 25 MG tablet Take 1 tablet (25 mg total) by mouth daily. 30 tablet 11   No current facility-administered medications for this visit.     SURGICAL HISTORY:  Past Surgical History:  Procedure Laterality Date  . ABDOMINAL AORTIC ANEURYSM REPAIR    . CARDIAC CATHETERIZATION  2001  . CARDIAC CATHETERIZATION N/A 07/16/2014   Procedure: Right/Left Heart Cath and Coronary Angiography;  Surgeon: Burnell Blanks, MD;  Location: [facility];  Service: Cardiovascular;  Laterality: N/A;  . CHOLECYSTECTOMY    . COLONOSCOPY W/ POLYPECTOMY    . CORONARY ARTERY BYPASS GRAFT    . ENDARTERECTOMY FEMORAL Left 08/19/2014   Procedure: PROFUNDA FEMORIS ENDARTERECTOMY ;  Surgeon: Rosetta Posner, MD;  Location: [facility];  Service: Vascular;  Laterality: Left;  . EYE SURGERY Bilateral    cataract removal  . FEMORAL-POPLITEAL BYPASS GRAFT Left 08/19/2014   Procedure: FEMORAL-POPLITEAL  ARTERY BYPASS GRAFT WITH PROPATEN GORTEX ;  Surgeon: Rosetta Posner, MD;  Location: [facility];  Service: Vascular;  Laterality: Left;  . heart bypass    . PATCH ANGIOPLASTY Left 08/19/2014   Procedure: LEFT PROFUNDA FEMORIS PATCH ANGIOPLASTY USING HEMASHIELD PLATINUM FINESSE PATCH;  Surgeon: Rosetta Posner, MD;  Location: [facility];  Service: Vascular;  Laterality: Left;  . PERIPHERAL VASCULAR CATHETERIZATION N/A 08/05/2014   Procedure: Abdominal Aortogram w/Lower Extremity;  Surgeon: Wellington Hampshire, MD;  Location: [place] [facility];  Service: Cardiovascular;  Laterality: N/A;  . SKIN CANCER EXCISION N/A    Nose  . VIDEO BRONCHOSCOPY WITH ENDOBRONCHIAL NAVIGATION N/A 07/29/2014   Procedure: VIDEO BRONCHOSCOPY WITH ENDOBRONCHIAL NAVIGATION;  Surgeon: Collene Gobble, MD;  Location: [facility];  Service: Thoracic;  Laterality: N/A;    REVIEW OF SYSTEMS:  A comprehensive review of systems was negative except for: Constitutional: positive for fatigue Respiratory: positive for cough and dyspnea on exertion   PHYSICAL EXAMINATION: General appearance: alert, cooperative, fatigued and no distress Head: Normocephalic, without obvious abnormality, atraumatic Neck: no adenopathy, no JVD, supple, symmetrical, trachea midline and thyroid not enlarged, symmetric, no tenderness/mass/nodules Lymph nodes: Cervical, supraclavicular, and axillary nodes normal. Resp: clear to auscultation bilaterally Back: symmetric, no curvature. ROM normal. No CVA tenderness. Cardio: regular rate and rhythm, S1, S2 normal, no murmur, click, rub or gallop GI: soft, non-tender; bowel sounds normal; no masses,  no organomegaly Extremities: extremities normal, atraumatic, no cyanosis or edema  ECOG PERFORMANCE STATUS: 1 - Symptomatic but completely ambulatory  Blood pressure (!) 100/56, pulse 71, temperature 97.6 F (36.4 C), temperature source Oral, resp. rate 18, weight 205 lb 8 oz (93.2 kg), SpO2 95 %.  LABORATORY DATA: Lab Results    Component Value Date   WBC 5.8 03/07/2016   HGB 10.7 (L) 03/07/2016   HCT 34.2 (L) 03/07/2016   MCV 88.1 03/07/2016   PLT 269 03/07/2016      Chemistry      Component Value Date/Time   NA 135 (L) 03/07/2016 1125   K 4.5 03/07/2016 1125   CL 106 12/01/2015 0914   CO2 23 03/07/2016 1125   BUN 14.4 03/07/2016 1125   CREATININE 1.2 03/07/2016 1125      Component Value Date/Time   CALCIUM 9.4 03/07/2016 1125   ALKPHOS 112 03/07/2016 1125   AST 26 03/07/2016 1125   ALT 8 03/07/2016 1125   BILITOT 0.37 03/07/2016 1125       RADIOGRAPHIC STUDIES: No results found.  ASSESSMENT AND PLAN: This is a very pleasant 76 years old white male with a stage IV non-small cell lung cancer, adenocarcinoma status post systemic chemotherapy and he is currently undergoing second line treatment with immunotherapy with Tecentriq Steve Baker) status post 2 cycles. He is tolerating his treatment well with no concerning adverse effects. I recommended for the patient to proceed with cycle #3 today as scheduled. He would come back for follow-up visit in 3 weeks for evaluation after repeating CT scan of the  chest, abdomen and pelvis for restaging of his disease. The patient was advised to call immediately if he has any concerning symptoms in the interval. The patient voices understanding of current disease status and treatment options and is in agreement with the current care plan.  All questions were answered. The patient knows to call the clinic with any problems, questions or concerns. We can certainly see the patient much sooner if necessary. I spent 10 minutes counseling the patient face to face. The total time spent in the appointment was 15 minutes. Disclaimer: This note was dictated with voice recognition software. Similar sounding words can inadvertently be transcribed and may not be corrected upon review.

## 2016-03-07 NOTE — Patient Instructions (Signed)
Steps to Quit Smoking Smoking tobacco can be bad for your health. It can also affect almost every organ in your body. Smoking puts you and people around you at risk for many serious long-lasting (chronic) diseases. Quitting smoking is hard, but it is one of the best things that you can do for your health. It is never too late to quit. What are the benefits of quitting smoking? When you quit smoking, you lower your risk for getting serious diseases and conditions. They can include:  Lung cancer or lung disease.  Heart disease.  Stroke.  Heart attack.  Not being able to have children (infertility).  Weak bones (osteoporosis) and broken bones (fractures). If you have coughing, wheezing, and shortness of breath, those symptoms may get better when you quit. You may also get sick less often. If you are pregnant, quitting smoking can help to lower your chances of having a baby of low birth weight. What can I do to help me quit smoking? Talk with your doctor about what can help you quit smoking. Some things you can do (strategies) include:  Quitting smoking totally, instead of slowly cutting back how much you smoke over a period of time.  Going to in-person counseling. You are more likely to quit if you go to many counseling sessions.  Using resources and support systems, such as:  Online chats with a counselor.  Phone quitlines.  Printed self-help materials.  Support groups or group counseling.  Text messaging programs.  Mobile phone apps or applications.  Taking medicines. Some of these medicines may have nicotine in them. If you are pregnant or breastfeeding, do not take any medicines to quit smoking unless your doctor says it is okay. Talk with your doctor about counseling or other things that can help you. Talk with your doctor about using more than one strategy at the same time, such as taking medicines while you are also going to in-person counseling. This can help make quitting  easier. What things can I do to make it easier to quit? Quitting smoking might feel very hard at first, but there is a lot that you can do to make it easier. Take these steps:  Talk to your family and friends. Ask them to support and encourage you.  Call phone quitlines, reach out to support groups, or work with a counselor.  Ask people who smoke to not smoke around you.  Avoid places that make you want (trigger) to smoke, such as:  Bars.  Parties.  Smoke-break areas at work.  Spend time with people who do not smoke.  Lower the stress in your life. Stress can make you want to smoke. Try these things to help your stress:  Getting regular exercise.  Deep-breathing exercises.  Yoga.  Meditating.  Doing a body scan. To do this, close your eyes, focus on one area of your body at a time from head to toe, and notice which parts of your body are tense. Try to relax the muscles in those areas.  Download or buy apps on your mobile phone or tablet that can help you stick to your quit plan. There are many free apps, such as QuitGuide from the CDC (Centers for Disease Control and Prevention). You can find more support from smokefree.gov and other websites. This information is not intended to replace advice given to you by your health care provider. Make sure you discuss any questions you have with your health care provider. Document Released: 12/17/2008 Document Revised: 10/19/2015 Document   Reviewed: 07/07/2014 Elsevier Interactive Patient Education  2017 Elsevier Inc.  

## 2016-03-07 NOTE — Telephone Encounter (Signed)
Patient already on schedule for lab/fu/tx q3w. Patient given avs report and appointments for January thru June.

## 2016-03-17 ENCOUNTER — Other Ambulatory Visit: Payer: Medicare Other | Admitting: *Deleted

## 2016-03-17 DIAGNOSIS — I2510 Atherosclerotic heart disease of native coronary artery without angina pectoris: Secondary | ICD-10-CM

## 2016-03-17 DIAGNOSIS — E785 Hyperlipidemia, unspecified: Secondary | ICD-10-CM

## 2016-03-17 DIAGNOSIS — I10 Essential (primary) hypertension: Secondary | ICD-10-CM

## 2016-03-18 LAB — LIPID PANEL
CHOLESTEROL TOTAL: 123 mg/dL (ref 100–199)
Chol/HDL Ratio: 3.4 ratio units (ref 0.0–5.0)
HDL: 36 mg/dL — AB (ref >39)
LDL CALC: 60 mg/dL (ref 0–99)
Triglycerides: 136 mg/dL (ref 0–149)
VLDL Cholesterol Cal: 27 mg/dL (ref 5–40)

## 2016-03-18 LAB — ALT: ALT: 9 IU/L (ref 0–44)

## 2016-03-23 ENCOUNTER — Ambulatory Visit (HOSPITAL_COMMUNITY): Payer: Medicare Other

## 2016-03-27 ENCOUNTER — Ambulatory Visit (HOSPITAL_COMMUNITY)
Admission: RE | Admit: 2016-03-27 | Discharge: 2016-03-27 | Disposition: A | Discharge status: Home / Self Care | Payer: Medicare Other | Source: Ambulatory Visit | Attending: Internal Medicine | Admitting: Internal Medicine

## 2016-03-27 ENCOUNTER — Encounter (HOSPITAL_COMMUNITY): Payer: Self-pay

## 2016-03-27 DIAGNOSIS — I700 Atherosclerosis of aorta: Secondary | ICD-10-CM | POA: Insufficient documentation

## 2016-03-27 DIAGNOSIS — J439 Emphysema, unspecified: Secondary | ICD-10-CM | POA: Insufficient documentation

## 2016-03-27 DIAGNOSIS — Z5112 Encounter for antineoplastic immunotherapy: Secondary | ICD-10-CM | POA: Diagnosis not present

## 2016-03-27 DIAGNOSIS — Y842 Radiological procedure and radiotherapy as the cause of abnormal reaction of the patient, or of later complication, without mention of misadventure at the time of the procedure: Secondary | ICD-10-CM | POA: Diagnosis not present

## 2016-03-27 DIAGNOSIS — K5730 Diverticulosis of large intestine without perforation or abscess without bleeding: Secondary | ICD-10-CM | POA: Diagnosis not present

## 2016-03-27 DIAGNOSIS — I517 Cardiomegaly: Secondary | ICD-10-CM | POA: Diagnosis not present

## 2016-03-27 DIAGNOSIS — I70201 Unspecified atherosclerosis of native arteries of extremities, right leg: Secondary | ICD-10-CM | POA: Insufficient documentation

## 2016-03-27 DIAGNOSIS — J700 Acute pulmonary manifestations due to radiation: Secondary | ICD-10-CM | POA: Insufficient documentation

## 2016-03-27 DIAGNOSIS — J438 Other emphysema: Secondary | ICD-10-CM | POA: Diagnosis not present

## 2016-03-27 DIAGNOSIS — C3491 Malignant neoplasm of unspecified part of right bronchus or lung: Secondary | ICD-10-CM | POA: Diagnosis not present

## 2016-03-27 MED ORDER — IOPAMIDOL (ISOVUE-300) INJECTION 61%
100.0000 mL | Freq: Once | INTRAVENOUS | Status: AC | PRN
  Administered 2016-03-27: 100 mL via INTRAVENOUS

## 2016-03-27 MED ORDER — IOPAMIDOL (ISOVUE-300) INJECTION 61%
INTRAVENOUS | Status: AC
  Filled 2016-03-27: qty 100

## 2016-03-28 ENCOUNTER — Other Ambulatory Visit (HOSPITAL_BASED_OUTPATIENT_CLINIC_OR_DEPARTMENT_OTHER): Payer: Medicare Other

## 2016-03-28 ENCOUNTER — Ambulatory Visit (HOSPITAL_BASED_OUTPATIENT_CLINIC_OR_DEPARTMENT_OTHER): Payer: Medicare Other | Admitting: Internal Medicine

## 2016-03-28 ENCOUNTER — Ambulatory Visit (HOSPITAL_BASED_OUTPATIENT_CLINIC_OR_DEPARTMENT_OTHER): Payer: Medicare Other

## 2016-03-28 ENCOUNTER — Encounter: Payer: Self-pay | Admitting: Internal Medicine

## 2016-03-28 VITALS — BP 115/54 | HR 58 | Temp 98.0°F | Resp 17 | Ht 68.5 in | Wt 204.4 lb

## 2016-03-28 DIAGNOSIS — C7931 Secondary malignant neoplasm of brain: Secondary | ICD-10-CM | POA: Diagnosis not present

## 2016-03-28 DIAGNOSIS — C3491 Malignant neoplasm of unspecified part of right bronchus or lung: Secondary | ICD-10-CM

## 2016-03-28 DIAGNOSIS — C3411 Malignant neoplasm of upper lobe, right bronchus or lung: Secondary | ICD-10-CM

## 2016-03-28 DIAGNOSIS — I2510 Atherosclerotic heart disease of native coronary artery without angina pectoris: Secondary | ICD-10-CM

## 2016-03-28 DIAGNOSIS — J449 Chronic obstructive pulmonary disease, unspecified: Secondary | ICD-10-CM | POA: Diagnosis not present

## 2016-03-28 DIAGNOSIS — Z5112 Encounter for antineoplastic immunotherapy: Secondary | ICD-10-CM

## 2016-03-28 DIAGNOSIS — I259 Chronic ischemic heart disease, unspecified: Secondary | ICD-10-CM | POA: Diagnosis not present

## 2016-03-28 DIAGNOSIS — J438 Other emphysema: Secondary | ICD-10-CM

## 2016-03-28 LAB — COMPREHENSIVE METABOLIC PANEL
ALT: 8 U/L (ref 0–55)
AST: 23 U/L (ref 5–34)
Albumin: 3.5 g/dL (ref 3.5–5.0)
Alkaline Phosphatase: 104 U/L (ref 40–150)
Anion Gap: 9 mEq/L (ref 3–11)
BILIRUBIN TOTAL: 0.37 mg/dL (ref 0.20–1.20)
BUN: 18.1 mg/dL (ref 7.0–26.0)
CHLORIDE: 106 meq/L (ref 98–109)
CO2: 21 meq/L — AB (ref 22–29)
Calcium: 9.4 mg/dL (ref 8.4–10.4)
Creatinine: 1.2 mg/dL (ref 0.7–1.3)
EGFR: 57 mL/min/{1.73_m2} — AB (ref >90)
GLUCOSE: 80 mg/dL (ref 70–140)
POTASSIUM: 4.8 meq/L (ref 3.5–5.1)
SODIUM: 136 meq/L (ref 136–145)
TOTAL PROTEIN: 6.8 g/dL (ref 6.4–8.3)

## 2016-03-28 LAB — CBC WITH DIFFERENTIAL/PLATELET
BASO%: 0.6 % (ref 0.0–2.0)
Basophils Absolute: 0 10*3/uL (ref 0.0–0.1)
EOS ABS: 0.3 10*3/uL (ref 0.0–0.5)
EOS%: 5.4 % (ref 0.0–7.0)
HCT: 32.5 % — ABNORMAL LOW (ref 38.4–49.9)
HGB: 10.3 g/dL — ABNORMAL LOW (ref 13.0–17.1)
LYMPH%: 9.2 % — AB (ref 14.0–49.0)
MCH: 26.8 pg — ABNORMAL LOW (ref 27.2–33.4)
MCHC: 31.7 g/dL — ABNORMAL LOW (ref 32.0–36.0)
MCV: 84.5 fL (ref 79.3–98.0)
MONO#: 0.7 10*3/uL (ref 0.1–0.9)
MONO%: 11.7 % (ref 0.0–14.0)
NEUT%: 73.1 % (ref 39.0–75.0)
NEUTROS ABS: 4.3 10*3/uL (ref 1.5–6.5)
Platelets: 248 10*3/uL (ref 140–400)
RBC: 3.85 10*6/uL — AB (ref 4.20–5.82)
RDW: 17.5 % — ABNORMAL HIGH (ref 11.0–14.6)
WBC: 5.8 10*3/uL (ref 4.0–10.3)
lymph#: 0.5 10*3/uL — ABNORMAL LOW (ref 0.9–3.3)

## 2016-03-28 LAB — TSH: TSH: 3.955 m[IU]/L (ref 0.320–4.118)

## 2016-03-28 MED ORDER — SODIUM CHLORIDE 0.9 % IV SOLN
1200.0000 mg | Freq: Once | INTRAVENOUS | Status: AC
  Administered 2016-03-28: 1200 mg via INTRAVENOUS
  Filled 2016-03-28: qty 20

## 2016-03-28 MED ORDER — SODIUM CHLORIDE 0.9 % IV SOLN
Freq: Once | INTRAVENOUS | Status: AC
  Administered 2016-03-28: 13:00:00 via INTRAVENOUS

## 2016-03-28 NOTE — Patient Instructions (Signed)
[facility] Discharge Instructions for Patients Receiving Chemotherapy  Today you received the following chemotherapy agents: Atezolizumab   To help prevent nausea and vomiting after your treatment, we encourage you to take your nausea medication as directed.  If you develop nausea and vomiting that is not controlled by your nausea medication, call the clinic.   BELOW ARE SYMPTOMS THAT SHOULD BE REPORTED IMMEDIATELY:  *FEVER GREATER THAN 100.5 F  *CHILLS WITH OR WITHOUT FEVER  NAUSEA AND VOMITING THAT IS NOT CONTROLLED WITH YOUR NAUSEA MEDICATION  *UNUSUAL SHORTNESS OF BREATH  *UNUSUAL BRUISING OR BLEEDING  TENDERNESS IN MOUTH AND THROAT WITH OR WITHOUT PRESENCE OF ULCERS  *URINARY PROBLEMS  *BOWEL PROBLEMS  UNUSUAL RASH Items with * indicate a potential emergency and should be followed up as soon as possible.  Feel free to call the clinic you have any questions or concerns. The clinic phone number is [phone].  Please show the [place] at check-in to the Emergency Department and triage nurse.

## 2016-03-28 NOTE — Progress Notes (Signed)
[place] [phone]   [phone]  OFFICE PROGRESS NOTE  Leonard Downing, MD [place] [address]  DIAGNOSIS: Stage IV (T2a, N2, M1a) non-small cell lung cancer, adenocarcinoma diagnosed in June 2016.  PRIOR THERAPY: 1) Systemic chemotherapy carboplatin for AUC of 5 and Alimta at 500 mg/m given every 3 weeks. Status post 6 cycles. 2) palliative radiotherapy to Steve right upper lobe lung mass under Steve care of Dr. Sondra Come completed 06/01/2015.  CURRENT THERAPY: Second line treatment with immunotherapy with Tecentriq (Atezolizumab) 1200 MG IV every 3 weeks status post 3 cycles. First dose was given 01/25/2016.  INTERVAL HISTORY: Steve Baker 76 y.o. male returns to Steve clinic today for follow-up visit accompanied by his wife. Steve Baker is currently undergoing treatment with immunotherapy with Tecentriq [name]) status post 3 cycles. He is treating this treatment well with no significant adverse effects. He denied having any skin rash or diarrhea. He has no nausea or vomiting. He denied having any significant weight loss or night sweats. He has no chest pain, shortness of breath except with exertion, cough or hemoptysis. He has no fever or chills. He had repeat CT scan of Steve chest, abdomen and pelvis performed recently and he is here for evaluation and discussion of his scan results.  MEDICAL HISTORY: Past Medical History:  Diagnosis Date  . Anemia   . Anxiety   . Brain cancer ([place])    brain metastasis  . COPD (chronic obstructive pulmonary disease) ([place])   . Coronary artery disease    s/p CABG now with 2/4 grafts patent by cath 2016 - medical management  . DCM (dilated cardiomyopathy) ([place]) 02/18/2015  . Dysrhythmia   . Encounter for antineoplastic immunotherapy 01/18/2016  . Full code status 12/23/2014  . History of bronchitis   . Hyperlipidemia LDL goal <70 02/18/2015  . Hypertension    lisinopril  .  Lung cancer ([place])    adenocarcinoma of right lung  . Peripheral vascular disease ([place])    s/p aortobifemoral bypass and left profundus femoral endarterectomy with patch angioplasty and left femoral to above Steve knee popliteal bypass  . Prostate cancer ([place]) 2001   seeds  . Radiation 04/14/15-06/03/15   50.4 Gy to right lung with an additional boost of 12.6 Gy to right lung  . Shortness of breath dyspnea    with ambulation    ALLERGIES:  has No Known Allergies.  MEDICATIONS:  Current Outpatient Prescriptions  Medication Sig Dispense Refill  . albuterol (PROVENTIL HFA;VENTOLIN HFA) 108 (90 Base) MCG/ACT inhaler Inhale 2 puffs into Steve lungs every 6 (six) hours as needed for wheezing or shortness of breath. 1 Inhaler 5  . aspirin EC 81 MG tablet Take 81 mg by mouth daily.    . carvedilol (COREG) 3.125 MG tablet TAKE ONE TABLET BY MOUTH TWICE DAILY WITH A MEAL 180 tablet 2  . lisinopril (PRINIVIL,ZESTRIL) 40 MG tablet Take 1 tablet (40 mg total) by mouth daily. 90 tablet 3  . rosuvastatin (CRESTOR) 20 MG tablet Take 1 tablet (20 mg total) by mouth daily. 30 tablet 11  . SPIRIVA RESPIMAT 2.5 MCG/ACT AERS INHALE TWO SPRAY(S) BY MOUTH ONCE DAILY 1 Inhaler 5  . spironolactone (ALDACTONE) 25 MG tablet Take 1 tablet (25 mg total) by mouth daily. 30 tablet 11   No current facility-administered medications for this visit.     SURGICAL HISTORY:  Past Surgical History:  Procedure Laterality Date  . ABDOMINAL AORTIC  ANEURYSM REPAIR    . CARDIAC CATHETERIZATION  2001  . CARDIAC CATHETERIZATION N/A 07/16/2014   Procedure: Right/Left Heart Cath and Coronary Angiography;  Surgeon: Burnell Blanks, MD;  Location: [facility];  Service: Cardiovascular;  Laterality: N/A;  . CHOLECYSTECTOMY    . COLONOSCOPY W/ POLYPECTOMY    . CORONARY ARTERY BYPASS GRAFT    . ENDARTERECTOMY FEMORAL Left 08/19/2014   Procedure: PROFUNDA FEMORIS ENDARTERECTOMY ;  Surgeon: Rosetta Posner, MD;  Location: [facility];   Service: Vascular;  Laterality: Left;  . EYE SURGERY Bilateral    cataract removal  . FEMORAL-POPLITEAL BYPASS GRAFT Left 08/19/2014   Procedure: FEMORAL-POPLITEAL ARTERY BYPASS GRAFT WITH PROPATEN GORTEX ;  Surgeon: Rosetta Posner, MD;  Location: [facility];  Service: Vascular;  Laterality: Left;  . heart bypass    . PATCH ANGIOPLASTY Left 08/19/2014   Procedure: LEFT PROFUNDA FEMORIS PATCH ANGIOPLASTY USING HEMASHIELD PLATINUM FINESSE PATCH;  Surgeon: Rosetta Posner, MD;  Location: [facility];  Service: Vascular;  Laterality: Left;  . PERIPHERAL VASCULAR CATHETERIZATION N/A 08/05/2014   Procedure: Abdominal Aortogram w/Lower Extremity;  Surgeon: Wellington Hampshire, MD;  Location: [facility];  Service: Cardiovascular;  Laterality: N/A;  . SKIN CANCER EXCISION N/A    Nose  . VIDEO BRONCHOSCOPY WITH ENDOBRONCHIAL NAVIGATION N/A 07/29/2014   Procedure: VIDEO BRONCHOSCOPY WITH ENDOBRONCHIAL NAVIGATION;  Surgeon: Collene Gobble, MD;  Location: [facility];  Service: Thoracic;  Laterality: N/A;    REVIEW OF SYSTEMS:  Constitutional: negative Eyes: negative Ears, nose, mouth, throat, and face: negative Respiratory: positive for dyspnea on exertion Cardiovascular: negative Gastrointestinal: negative Genitourinary:negative Integument/breast: negative Hematologic/lymphatic: negative Musculoskeletal:negative Neurological: negative Behavioral/Psych: negative Endocrine: negative Allergic/Immunologic: negative   PHYSICAL EXAMINATION: General appearance: alert, cooperative and no distress Head: Normocephalic, without obvious abnormality, atraumatic Neck: no adenopathy, no JVD, supple, symmetrical, trachea midline and thyroid not enlarged, symmetric, no tenderness/mass/nodules Lymph nodes: Cervical, supraclavicular, and axillary nodes normal. Resp: clear to auscultation bilaterally Back: symmetric, no curvature. ROM normal. No CVA tenderness. Cardio: regular rate and rhythm, S1, S2 normal, no murmur, click, rub  or gallop GI: soft, non-tender; bowel sounds normal; no masses,  no organomegaly Extremities: extremities normal, atraumatic, no cyanosis or edema Neurologic: Alert and oriented X 3, normal strength and tone. Normal symmetric reflexes. Normal coordination and gait  ECOG PERFORMANCE STATUS: 1 - Symptomatic but completely ambulatory  Blood pressure (!) 115/54, pulse (!) 58, temperature 98 F (36.7 C), temperature source Oral, resp. rate 17, height 5' 8.5" (1.74 m), weight 204 lb 6.4 oz (92.7 kg), SpO2 98 %.  LABORATORY DATA: Lab Results  Component Value Date   WBC 5.8 03/28/2016   HGB 10.3 (L) 03/28/2016   HCT 32.5 (L) 03/28/2016   MCV 84.5 03/28/2016   PLT 248 03/28/2016      Chemistry      Component Value Date/Time   NA 136 03/28/2016 1115   K 4.8 03/28/2016 1115   CL 106 12/01/2015 0914   CO2 21 (L) 03/28/2016 1115   BUN 18.1 03/28/2016 1115   CREATININE 1.2 03/28/2016 1115      Component Value Date/Time   CALCIUM 9.4 03/28/2016 1115   ALKPHOS 104 03/28/2016 1115   AST 23 03/28/2016 1115   ALT 8 03/28/2016 1115   BILITOT 0.37 03/28/2016 1115       RADIOGRAPHIC STUDIES: Ct Chest W Contrast  Result Date: 03/27/2016 CLINICAL DATA:  Bilateral lung cancer, diagnosed in 2016 and 2017. Ongoing chemotherapy. Intracranial  metastatic disease. Emphysema. EXAM: CT CHEST, ABDOMEN, AND PELVIS WITH CONTRAST TECHNIQUE: Multidetector CT imaging of Steve chest, abdomen and pelvis was performed following Steve standard protocol during bolus administration of intravenous contrast. CONTRAST:  178m ISOVUE-300 IOPAMIDOL (ISOVUE-300) INJECTION 61% COMPARISON:  03/12/2015 FINDINGS: CT CHEST FINDINGS Cardiovascular: Coronary, aortic arch, and branch vessel atherosclerotic vascular disease. Prior CABG. Mediastinum/Nodes: No pathologic adenopathy. Lungs/Pleura: Steve right upper lobe mass is obscured by considerable surrounding volume loss and consolidation characteristic for radiation pneumonitis, but in  general is significantly reduced in conspicuity. Certainly there could be significant residual mass in this region of presumed radiation pneumonitis and volume loss. Interstitial and mild airspace opacity in Steve superior segment right lower lobe likely attributable to radiation therapy is well. Emphysema. Suspected fiducials in Steve left lower lobe. Bandlike density in Steve left lower lobe compatible with treated tumor region currently measuring at 2.4 by 0.7 cm on image 101 series 4. I suspect that there is a greater degree of volume loss in this vicinity on today's exam, this region of involvement was previously 2.4 by 0.4 cm. Accordingly it is slightly thickened in Steve interim. There some calcified nodules in Steve left lung suspicious for granulomas. Musculoskeletal: Unremarkable CT ABDOMEN PELVIS FINDINGS Hepatobiliary: Cholecystectomy. No significant focal lesion identified. Pancreas: Unremarkable Spleen: Unremarkable Adrenals/Urinary Tract: Unremarkable Stomach/Bowel: Sigmoid diverticulosis. Vascular/Lymphatic: Mild prominence of porta hepatis nodal tissue extending just above Steve common hepatic artery, image 62/2, stable, measuring about 1.2 cm in short axis. Aortoiliac atherosclerotic vascular disease. Aortobifemoral graft, patent. Steve occlusion of Steve right superficial femoral artery extends further proximally than it did before, now extending all Steve way back to Steve anastomosis. A left common femoral artery to distal graft is partially visualized and patent. Reproductive: Brachytherapy seeds are present in Steve prostate gland. Calcified vas deferens. Other: No supplemental non-categorized findings. Musculoskeletal: Lower lumbar spondylosis and degenerative disc disease without overt impingement. Small umbilical and supraumbilical hernias containing omental adipose tissue. IMPRESSION: 1. Right upper lobe mass currently obscured by surrounding volume loss and consolidation characteristic of radiation  pneumonitis. 2. Steve bandlike density in Steve left lower lobe compatible with treated tumor is slightly more thick than on Steve prior exam, and merit surveillance for potential early growth. 3. Steve occlusion of Steve right SFA extends further proximally than it did before ; Steve deep branch remains patent. 4. Other imaging findings of potential clinical significance: Coronary, aortic arch, and branch vessel atherosclerotic vascular disease. Mild cardiomegaly. Emphysema. Sigmoid diverticulosis. Aortoiliac atherosclerotic vascular disease. Brachytherapy seeds in Steve prostate gland. Small umbilical and supraumbilical hernias containing omental adipose tissue. Electronically Signed   By: WVan ClinesM.D.   On: 03/27/2016 13:33   Ct Abdomen Pelvis W Contrast  Result Date: 03/27/2016 CLINICAL DATA:  Bilateral lung cancer, diagnosed in 2016 and 2017. Ongoing chemotherapy. Intracranial metastatic disease. Emphysema. EXAM: CT CHEST, ABDOMEN, AND PELVIS WITH CONTRAST TECHNIQUE: Multidetector CT imaging of Steve chest, abdomen and pelvis was performed following Steve standard protocol during bolus administration of intravenous contrast. CONTRAST:  1061mISOVUE-300 IOPAMIDOL (ISOVUE-300) INJECTION 61% COMPARISON:  03/12/2015 FINDINGS: CT CHEST FINDINGS Cardiovascular: Coronary, aortic arch, and branch vessel atherosclerotic vascular disease. Prior CABG. Mediastinum/Nodes: No pathologic adenopathy. Lungs/Pleura: Steve right upper lobe mass is obscured by considerable surrounding volume loss and consolidation characteristic for radiation pneumonitis, but in general is significantly reduced in conspicuity. Certainly there could be significant residual mass in this region of presumed radiation pneumonitis and volume loss. Interstitial and mild airspace opacity in Steve superior segment  right lower lobe likely attributable to radiation therapy is well. Emphysema. Suspected fiducials in Steve left lower lobe. Bandlike density in Steve left  lower lobe compatible with treated tumor region currently measuring at 2.4 by 0.7 cm on image 101 series 4. I suspect that there is a greater degree of volume loss in this vicinity on today's exam, this region of involvement was previously 2.4 by 0.4 cm. Accordingly it is slightly thickened in Steve interim. There some calcified nodules in Steve left lung suspicious for granulomas. Musculoskeletal: Unremarkable CT ABDOMEN PELVIS FINDINGS Hepatobiliary: Cholecystectomy. No significant focal lesion identified. Pancreas: Unremarkable Spleen: Unremarkable Adrenals/Urinary Tract: Unremarkable Stomach/Bowel: Sigmoid diverticulosis. Vascular/Lymphatic: Mild prominence of porta hepatis nodal tissue extending just above Steve common hepatic artery, image 62/2, stable, measuring about 1.2 cm in short axis. Aortoiliac atherosclerotic vascular disease. Aortobifemoral graft, patent. Steve occlusion of Steve right superficial femoral artery extends further proximally than it did before, now extending all Steve way back to Steve anastomosis. A left common femoral artery to distal graft is partially visualized and patent. Reproductive: Brachytherapy seeds are present in Steve prostate gland. Calcified vas deferens. Other: No supplemental non-categorized findings. Musculoskeletal: Lower lumbar spondylosis and degenerative disc disease without overt impingement. Small umbilical and supraumbilical hernias containing omental adipose tissue. IMPRESSION: 1. Right upper lobe mass currently obscured by surrounding volume loss and consolidation characteristic of radiation pneumonitis. 2. Steve bandlike density in Steve left lower lobe compatible with treated tumor is slightly more thick than on Steve prior exam, and merit surveillance for potential early growth. 3. Steve occlusion of Steve right SFA extends further proximally than it did before ; Steve deep branch remains patent. 4. Other imaging findings of potential clinical significance: Coronary, aortic arch, and  branch vessel atherosclerotic vascular disease. Mild cardiomegaly. Emphysema. Sigmoid diverticulosis. Aortoiliac atherosclerotic vascular disease. Brachytherapy seeds in Steve prostate gland. Small umbilical and supraumbilical hernias containing omental adipose tissue. Electronically Signed   By: Van Clines M.D.   On: 03/27/2016 13:33    ASSESSMENT AND PLAN: This is a very pleasant 76 years old white male with stage IV non-small cell lung cancer status post systemic chemotherapy with carboplatin and Alimta and currently on second line treatment with Tecentriq (Atezolizumab) status post 3 cycles. He is tolerating his treatment well with no significant adverse effects. He had repeat CT scan of Steve chest, abdomen and pelvis performed recently. I personally and independently reviewed Steve scan images and discuss Steve results with Steve Baker and his wife. His scan showed no clear evidence for disease progression. I recommended for Steve Baker to continue his treatment with Tecentriq [name]) as scheduled and he will proceed with cycle #4 today. For Steve coronary heart disease seen on Steve recent scan, I strongly advised Steve Baker to follow-up with his cardiologist Dr. Radford Pax for further evaluation and recommendation. For COPD he is currently on albuterol and Spiriva. I will see Steve Baker back for follow-up visit in 3 weeks with Steve next cycle of his immunotherapy. He was advised to call immediately if he has any concerning symptoms in Steve interval. Steve Baker voices understanding of current disease status and treatment options and is in agreement with Steve current care plan.  All questions were answered. Steve Baker knows to call Steve clinic with any problems, questions or concerns. We can certainly see Steve Baker much sooner if necessary.  I spent 15 minutes counseling Steve Baker face to face. Steve total time spent in Steve appointment was 25 minutes.  Disclaimer: This  note was dictated  with voice recognition software. Similar sounding words can inadvertently be transcribed and may not be corrected upon review.

## 2016-03-29 ENCOUNTER — Ambulatory Visit (INDEPENDENT_AMBULATORY_CARE_PROVIDER_SITE_OTHER): Payer: Medicare Other | Admitting: Emergency Medicine

## 2016-03-29 ENCOUNTER — Encounter: Payer: Self-pay | Admitting: Emergency Medicine

## 2016-03-29 DIAGNOSIS — C3491 Malignant neoplasm of unspecified part of right bronchus or lung: Secondary | ICD-10-CM

## 2016-03-29 DIAGNOSIS — Z720 Tobacco use: Secondary | ICD-10-CM | POA: Diagnosis not present

## 2016-03-29 DIAGNOSIS — J438 Other emphysema: Secondary | ICD-10-CM

## 2016-03-29 NOTE — Patient Instructions (Addendum)
Please continue to work on decreasing your cigarettes. Congratulations on your progress so far.  Continue your Spiriva and albuterol as you are taking them  Flu shot up to date.  Repeat Ct scan and follow up with Dr Julien Nordmann per his plans.  Follow with Dr Lamonte Sakai in 6 months or sooner if you have any problems

## 2016-03-29 NOTE — Assessment & Plan Note (Signed)
Most recent CT scan of the chest done on 03/27/16 shows overall stability. There has been eating right upper lobe scar consistent with radiation pneumonitis. Also his left lower lobe opacity may be slightly more prominent. There is no clear evidence for recurrent cancer. He is to continue immunotherapy with [employer]

## 2016-03-29 NOTE — Progress Notes (Signed)
Subjective:    Patient ID: Steve Baker, male    DOB: 08/26/1940, 76 y.o.   MRN: [id number]  HPI 76 year old active smoker (12 pk-yrs) with history of CAD/CABG, abdominal aneurysm repair, hypertension, prostate CA (2001), hyperlipidemia. He underwent a screening CT scan of the chest 06/04/14 ordered by Dr. Arelia Sneddon that showed a dominant right upper lobe mass and a smaller left lower lobe nodule. He was evaluated in the lung cancer screening program and a PET scan has been obtained 06/18/14 > both lesions are hypermetabolic, as was a slightly enlarged right peritracheal node. There was evidence of pharyngeal esophageal activity without a corresponding anatomical abnormality on CT scan.   He denies any hemoptysis although he has had frequent cough. He has dyspnea that appears to be stable. He is not on any bronchodilators at this time. He has about 6 pounds of unintentional weight loss over the last month.   ROV 08/11/14 -- follow-up visit for tobacco use, presumed COPD, and newly diagnosed adenocarcinoma of the lung. This was obtained from biopsy of his right upper lobe mass. He also had a smaller left lower lobe nodule. Cytology on this was not definitive for malignancy. I placed fiducial markers in the right upper lobe mass and also triangulating the left lower lobe nodule. He has a mediastinal node that is hypermetabolic on PET scan. I considered biopsying this but I do not believe it'll change his management because he is a poor surgical candidate based on his clinical status. He is scheduled for [place] this Thursday.  Hx taken from the pt and his wife. He is still having cough, has some exertional SOB.   ROV 12/22/14 -- follow-up visit for COPD, tobacco use, and adenocarcinoma the lung for which she is undergoing chemotherapy with Dr. Julien Nordmann. Last Ct scan chest 11/05/14 showed that RUL mass and LLL nodule were shrinking. His breathing continues to limit him. He is on spiriva although unsure whether this helps  him. He has daily cough, prod of clear to white. Smoking 1 pk a day.   ROV 05/13/15 -- this is a follow-up visit for history of COPD, continued tobacco abuse and adenocarcinoma the lung. His last CT scan of the chest was 03/12/15 which showed some slight interval increase in his right upper lobe mass. He has started radiation therapy treatment, follows with Dr Sondra Come. Plan for CT chest in April. He has exertional SOB, has sore throat, lot of cough. Prod of thick white / clear.  Bothers him most days. Tolerates spiriva.  Has SABA available but has not used. Smoking 2.5 pk/day.  No flares, ED, pred , abx.   ROV 11/09/15 -- Patient has a history of COPD and had a carcinoma of the lung followed by radiation oncology and also by Dr. Julien Nordmann. He underwent Systemic chemotherapy and appeared to have tolerated reasonably well. Most recent CT scan of the chest was 10/07/15 and I have personally reviewed. This showed no significant difference in his right upper lobe nodular density, left lower lobe pulmonary nodule . He continues to smoke about a pack a day.  He has had wheeze, some increase in his cough prod of phlegm. Can happen at any time.  No hemoptysis. He is on Spiriva daily. His functional capacity is unchanged, able to exert if he paces himself.   ROV 03/29/16 -- this follow-up visit for patient with a history of COPD and adenoCA of the lung s/p chemoradiation. He continues to smoke about 16 cigarettes a day. Most recent  CT scan of the chest was done on 03/27/16 and I personally reviewed. This shows evolving volume loss and consolidation around his known right upper lobe mass consistent with radiation pneumonitis. The left lower lobe density is slightly more prominent than on his previous scan in 01/12/16. He is currently being treated w Atezolizumab. He is feeling well - has some occasional wheeze, cough. The cough is worst in the morning. Productive of minimal mucous. No blood. He is using albuterol about 2-3x a week. No  flares since last time.    Review of Systems As per HPI   Past Medical History:  Diagnosis Date  . Anemia   . Anxiety   . Brain cancer ([place])    brain metastasis  . COPD (chronic obstructive pulmonary disease) ([place])   . Coronary artery disease    s/p CABG now with 2/4 grafts patent by cath 2016 - medical management  . DCM (dilated cardiomyopathy) ([place]) 02/18/2015  . Dysrhythmia   . Encounter for antineoplastic immunotherapy 01/18/2016  . Full code status 12/23/2014  . History of bronchitis   . Hyperlipidemia LDL goal <70 02/18/2015  . Hypertension    lisinopril  . Lung cancer ([place])    adenocarcinoma of right lung  . Peripheral vascular disease ([place])    s/p aortobifemoral bypass and left profundus femoral endarterectomy with patch angioplasty and left femoral to above the knee popliteal bypass  . Prostate cancer ([place]) 2001   seeds  . Radiation 04/14/15-06/03/15   50.4 Gy to right lung with an additional boost of 12.6 Gy to right lung  . Shortness of breath dyspnea    with ambulation     Family History  Problem Relation Age of Onset  . Heart Problems Mother   . Heart attack Father   . Colon cancer Sister   . Stroke Neg Hx      Social History   Social History  . Marital status: Married    Spouse name: nancy  . Number of children: 2  . Years of education: college   Occupational History  . retired    Social History Main Topics  . Smoking status: Current Every Day Smoker    Packs/day: 2.50    Years: 57.00    Types: Cigarettes  . Smokeless tobacco: Never Used  . Alcohol use 1.8 oz/week    3 Cans of beer per week     Comment: 5 daily  . Drug use: No  . Sexual activity: Not Currently   Other Topics Concern  . Not on file   Social History Narrative  . 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 albuterol (PROVENTIL HFA;VENTOLIN HFA) 108 (90 Base) MCG/ACT inhaler Inhale 2 puffs into the lungs every 6 (six) hours as needed for wheezing or shortness of breath. 1 Inhaler 5  . aspirin EC 81 MG tablet Take 81 mg by mouth daily.    . carvedilol (COREG) 3.125 MG tablet TAKE ONE TABLET BY MOUTH TWICE DAILY WITH A MEAL 180 tablet 2  . lisinopril (PRINIVIL,ZESTRIL) 40 MG tablet Take 1 tablet (40 mg total) by mouth daily. 90 tablet 3  . rosuvastatin (CRESTOR) 20 MG tablet Take 1 tablet (20 mg total) by mouth daily. 30 tablet 11  . SPIRIVA RESPIMAT 2.5 MCG/ACT AERS INHALE TWO SPRAY(S)  BY MOUTH ONCE DAILY 1 Inhaler 5  . spironolactone (ALDACTONE) 25 MG tablet Take 1 tablet (25 mg total) by mouth daily. 30 tablet 11   No facility-administered medications prior to visit.        Objective:   Physical Exam Vitals:   03/29/16 1452  BP: 128/64  Pulse: 80  SpO2: 98%  Weight: 206 lb (93.4 kg)  Height: '5\' 8"'$  (1.727 m)   Gen: Pleasant, well-nourished, in no distress,  normal affect  ENT: No lesions,  no postnasal drip  Neck: No JVD, no TMG, no carotid bruits  Lungs: No use of accessory muscles,  clear without rales or rhonchi  Cardiovascular: RRR, heart sounds normal, no murmur or gallops, no peripheral edema  Musculoskeletal: No deformities, no cyanosis or clubbing. He does have nicotine staining on his nails  Neuro: alert, non focal  Skin: Warm, no lesions or rashes     Assessment & Plan:  COPD (chronic obstructive pulmonary disease) COPD with stable dyspnea and cough. No evidence of exacerbation. I would like to continue Spiriva, albuterol as needed. Flu shot is up-to-date.  Tobacco abuse Cessation with him today. He has cut down to 16 cigarettes daily which is an improvement for him. We talked about setting goal to get down to 12 cigarettes daily by our next visit.  Adenocarcinoma of right lung Most recent CT scan of the chest done on 03/27/16 shows overall stability.  There has been eating right upper lobe scar consistent with radiation pneumonitis. Also his left lower lobe opacity may be slightly more prominent. There is no clear evidence for recurrent cancer. He is to continue immunotherapy with Nelia Shi, MD, PhD 03/29/2016, 3:40 PM [facility] [phone] or if no answer [phone]

## 2016-03-29 NOTE — Assessment & Plan Note (Signed)
Cessation with him today. He has cut down to 16 cigarettes daily which is an improvement for him. We talked about setting goal to get down to 12 cigarettes daily by our next visit.

## 2016-03-29 NOTE — Assessment & Plan Note (Signed)
COPD with stable dyspnea and cough. No evidence of exacerbation. I would like to continue Spiriva, albuterol as needed. Flu shot is up-to-date.

## 2016-03-30 ENCOUNTER — Telehealth: Payer: Self-pay | Admitting: Internal Medicine

## 2016-03-30 NOTE — Telephone Encounter (Signed)
Patient's wife called and said that Steve Baker has decided not to do treatment any longer just a scan every 6 months.  She left a message on the nurses vm but I told her I would also send a note

## 2016-03-31 ENCOUNTER — Telehealth: Payer: Self-pay | Admitting: *Deleted

## 2016-03-31 NOTE — Telephone Encounter (Signed)
Wife called. States Steve Baker has decided he wants to continue treatments. Will keep 02/15/17 appt for lab, MM and treatment.

## 2016-04-11 ENCOUNTER — Encounter: Payer: Self-pay | Admitting: Cardiology

## 2016-04-13 ENCOUNTER — Telehealth: Payer: Self-pay | Admitting: Medical Oncology

## 2016-04-13 NOTE — Telephone Encounter (Signed)
Wife called to cancel app appts He does not need anything at this time or any referrals to home care/hospice.

## 2016-04-18 ENCOUNTER — Ambulatory Visit: Payer: Medicare Other

## 2016-04-18 ENCOUNTER — Ambulatory Visit: Payer: Medicare Other | Admitting: Internal Medicine

## 2016-04-18 ENCOUNTER — Other Ambulatory Visit: Payer: Medicare Other

## 2016-05-09 ENCOUNTER — Ambulatory Visit: Payer: Medicare Other

## 2016-05-09 ENCOUNTER — Ambulatory Visit: Payer: Medicare Other | Admitting: Internal Medicine

## 2016-05-09 ENCOUNTER — Other Ambulatory Visit: Payer: Medicare Other

## 2016-05-12 ENCOUNTER — Other Ambulatory Visit: Payer: Self-pay | Admitting: Cardiology

## 2016-05-30 ENCOUNTER — Ambulatory Visit: Payer: Medicare Other | Admitting: Internal Medicine

## 2016-05-30 ENCOUNTER — Other Ambulatory Visit: Payer: Medicare Other

## 2016-05-30 ENCOUNTER — Ambulatory Visit: Payer: Medicare Other

## 2016-06-20 ENCOUNTER — Other Ambulatory Visit: Payer: Medicare Other

## 2016-06-20 ENCOUNTER — Ambulatory Visit: Payer: Medicare Other | Admitting: Internal Medicine

## 2016-06-20 ENCOUNTER — Ambulatory Visit: Payer: Medicare Other

## 2016-07-11 ENCOUNTER — Ambulatory Visit: Payer: Medicare Other | Admitting: Internal Medicine

## 2016-07-11 ENCOUNTER — Ambulatory Visit: Payer: Medicare Other

## 2016-07-11 ENCOUNTER — Other Ambulatory Visit: Payer: Medicare Other

## 2016-07-14 ENCOUNTER — Other Ambulatory Visit: Payer: Self-pay | Admitting: Physician Assistant

## 2016-07-14 DIAGNOSIS — I25810 Atherosclerosis of coronary artery bypass graft(s) without angina pectoris: Secondary | ICD-10-CM

## 2016-07-14 DIAGNOSIS — E785 Hyperlipidemia, unspecified: Secondary | ICD-10-CM

## 2016-07-15 ENCOUNTER — Other Ambulatory Visit: Payer: Self-pay | Admitting: Physician Assistant

## 2016-07-15 DIAGNOSIS — I25810 Atherosclerosis of coronary artery bypass graft(s) without angina pectoris: Secondary | ICD-10-CM

## 2016-07-15 DIAGNOSIS — E785 Hyperlipidemia, unspecified: Secondary | ICD-10-CM

## 2016-08-01 ENCOUNTER — Other Ambulatory Visit: Payer: Medicare Other

## 2016-08-01 ENCOUNTER — Ambulatory Visit: Payer: Medicare Other | Admitting: Internal Medicine

## 2016-08-01 ENCOUNTER — Ambulatory Visit: Payer: Medicare Other

## 2016-08-22 ENCOUNTER — Ambulatory Visit: Payer: Medicare Other | Admitting: Internal Medicine

## 2016-08-22 ENCOUNTER — Other Ambulatory Visit: Payer: Medicare Other

## 2016-08-22 ENCOUNTER — Ambulatory Visit: Payer: Medicare Other

## 2016-09-26 ENCOUNTER — Telehealth: Payer: Self-pay | Admitting: Emergency Medicine

## 2016-09-26 ENCOUNTER — Ambulatory Visit (INDEPENDENT_AMBULATORY_CARE_PROVIDER_SITE_OTHER)
Admission: RE | Admit: 2016-09-26 | Discharge: 2016-09-26 | Disposition: A | Discharge status: Home / Self Care | Payer: Medicare Other | Source: Ambulatory Visit | Attending: Emergency Medicine | Admitting: Emergency Medicine

## 2016-09-26 ENCOUNTER — Encounter: Payer: Self-pay | Admitting: Emergency Medicine

## 2016-09-26 ENCOUNTER — Ambulatory Visit (INDEPENDENT_AMBULATORY_CARE_PROVIDER_SITE_OTHER): Payer: Medicare Other | Admitting: Emergency Medicine

## 2016-09-26 VITALS — BP 102/62 | HR 82 | Ht 68.0 in | Wt 198.0 lb

## 2016-09-26 DIAGNOSIS — Z720 Tobacco use: Secondary | ICD-10-CM

## 2016-09-26 DIAGNOSIS — C3491 Malignant neoplasm of unspecified part of right bronchus or lung: Secondary | ICD-10-CM

## 2016-09-26 DIAGNOSIS — J438 Other emphysema: Secondary | ICD-10-CM

## 2016-09-26 NOTE — Telephone Encounter (Signed)
Notes recorded by Collene Gobble, MD on 09/26/2016 at 2:43 PM EDT Please let him know that his CXR has not changed. No evidence for progression of lung cancer. ------------------------- Spoke with pt. He is aware of results. Nothing further was needed.

## 2016-09-26 NOTE — Patient Instructions (Signed)
We will perform a CXR today Please continue your Spiriva once a day. start rinsing and gargling after taking this medication Keep albuterol available to use 2 puffs if needed for shortness of breath or wheezing. If you develop new symptoms including changes in your breathing, chest pain, cough then we will plan to repeat her chest x-ray, probably discussed her symptoms with Dr. Julien Nordmann to determine whether any further therapy would be beneficial. If new symptoms do evolve it would also be reasonable for [place] to discuss purely symptom management, hospice care.           Follow with Dr Lamonte Sakai in 6 months or sooner if you have any problems

## 2016-09-26 NOTE — Progress Notes (Signed)
Subjective:    Patient ID: Steve Baker, male    DOB: 10/13/1940, 76 y.o.   MRN: [id number]  HPI 76 year old active smoker (28 pk-yrs) with history of CAD/CABG, abdominal aneurysm repair, hypertension, prostate CA (2001), hyperlipidemia. He underwent a screening CT scan of the chest 06/04/14 ordered by Dr. Arelia Sneddon that showed a dominant right upper lobe mass and a smaller left lower lobe nodule. He was evaluated in the lung cancer screening program and a PET scan has been obtained 06/18/14 > both lesions are hypermetabolic, as was a slightly enlarged right peritracheal node. There was evidence of pharyngeal esophageal activity without a corresponding anatomical abnormality on CT scan.   He denies any hemoptysis although he has had frequent cough. He has dyspnea that appears to be stable. He is not on any bronchodilators at this time. He has about 6 pounds of unintentional weight loss over the last month.   ROV 08/11/14 -- follow-up visit for tobacco use, presumed COPD, and newly diagnosed adenocarcinoma of the lung. This was obtained from biopsy of his right upper lobe mass. He also had a smaller left lower lobe nodule. Cytology on this was not definitive for malignancy. I placed fiducial markers in the right upper lobe mass and also triangulating the left lower lobe nodule. He has a mediastinal node that is hypermetabolic on PET scan. I considered biopsying this but I do not believe it'll change his management because he is a poor surgical candidate based on his clinical status. He is scheduled for [place] this Thursday.  Hx taken from the pt and his wife. He is still having cough, has some exertional SOB.   ROV 12/22/14 -- follow-up visit for COPD, tobacco use, and adenocarcinoma the lung for which she is undergoing chemotherapy with Dr. Julien Nordmann. Last Ct scan chest 11/05/14 showed that RUL mass and LLL nodule were shrinking. His breathing continues to limit him. He is on spiriva although unsure whether this helps  him. He has daily cough, prod of clear to white. Smoking 1 pk a day.   ROV 05/13/15 -- this is a follow-up visit for history of COPD, continued tobacco abuse and adenocarcinoma the lung. His last CT scan of the chest was 03/12/15 which showed some slight interval increase in his right upper lobe mass. He has started radiation therapy treatment, follows with Dr Sondra Come. Plan for CT chest in April. He has exertional SOB, has sore throat, lot of cough. Prod of thick white / clear.  Bothers him most days. Tolerates spiriva.  Has SABA available but has not used. Smoking 2.5 pk/day.  No flares, ED, pred , abx.   ROV 11/09/15 -- Patient has a history of COPD and had a carcinoma of the lung followed by radiation oncology and also by Dr. Julien Nordmann. He underwent Systemic chemotherapy and appeared to have tolerated reasonably well. Most recent CT scan of the chest was 10/07/15 and I have personally reviewed. This showed no significant difference in his right upper lobe nodular density, left lower lobe pulmonary nodule . He continues to smoke about a pack a day.  He has had wheeze, some increase in his cough prod of phlegm. Can happen at any time.  No hemoptysis. He is on Spiriva daily. His functional capacity is unchanged, able to exert if he paces himself.   ROV 03/29/16 -- this follow-up visit for patient with a history of COPD and adenoCA of the lung s/p chemoradiation. He continues to smoke about 16 cigarettes a day. Most recent  CT scan of the chest was done on 03/27/16 and I personally reviewed. This shows evolving volume loss and consolidation around his known right upper lobe mass consistent with radiation pneumonitis. The left lower lobe density is slightly more prominent than on his previous scan in 01/12/16. He is currently being treated w Atezolizumab. He is feeling well - has some occasional wheeze, cough. The cough is worst in the morning. Productive of minimal mucous. No blood. He is using albuterol about 2-3x a week. No  flares since last time.   ROV 09/26/16 -- Steve Baker has a history of COPD, adenocarcinoma of the lung status post chemoradiation. He follows with Dr. Julien Nordmann in his last CT scan was performed before our last visit as detailed above. He had been managed on atezolizumab, but decided to stop it. He tells me that he continues to smoke about 1 pack a day. He believes that his breathing is stable. He uses spiriva and albuterol. No real wheeze or cough. Denies pain.   Review of Systems As per HPI   Past Medical History:  Diagnosis Date  . Anemia   . Anxiety   . 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 COPD (chronic obstructive pulmonary disease) ([place])   . Coronary artery disease    s/p CABG now with 2/4 grafts patent by cath 2016 - medical management  . DCM (dilated cardiomyopathy) ([place]) 02/18/2015  . Dysrhythmia   . Encounter for antineoplastic immunotherapy 01/18/2016  . Full code status 12/23/2014  . History of bronchitis   . Hyperlipidemia LDL goal <70 02/18/2015  . Hypertension    lisinopril  . Lung cancer ([place])    adenocarcinoma of right lung  . Peripheral vascular disease ([place])    s/p aortobifemoral bypass and left profundus femoral endarterectomy with patch angioplasty and left femoral to above the knee popliteal bypass  . Prostate cancer ([place]) 2001   seeds  . Radiation 04/14/15-06/03/15   50.4 Gy to right lung with an additional boost of 12.6 Gy to right lung  . Shortness of breath dyspnea    with ambulation     Family History  Problem Relation Age of Onset  . Heart Problems Mother   . Heart attack Father   . Colon cancer Sister   . Stroke Neg Hx      Social History   Social History  . Marital status: Married    Spouse name: nancy  . Number of children: 2  . Years of education: college   Occupational History  . retired    Social History Main Topics  . Smoking status: Current Every Day Smoker    Packs/day: 2.50    Years: 57.00    Types: Cigarettes  .  Smokeless tobacco: Never Used  . Alcohol use 1.8 oz/week    3 Cans of beer per week     Comment: 5 daily  . Drug use: No  . Sexual activity: Not Currently   Other Topics Concern  . Not on file   Social History Narrative  . 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 albuterol (PROVENTIL HFA;VENTOLIN HFA) 108 (90 Base) MCG/ACT inhaler Inhale 2 puffs into the lungs every 6 (six) hours as needed for wheezing or shortness of breath. 1 Inhaler 5  . aspirin  EC 81 MG tablet Take 81 mg by mouth daily.    . carvedilol (COREG) 3.125 MG tablet TAKE ONE TABLET BY MOUTH TWICE DAILY WITH A MEAL 180 tablet 2  . lisinopril (PRINIVIL,ZESTRIL) 40 MG tablet Take 1 tablet (40 mg total) by mouth daily. 90 tablet 3  . rosuvastatin (CRESTOR) 20 MG tablet TAKE ONE TABLET BY MOUTH ONCE DAILY 30 tablet 0  . SPIRIVA RESPIMAT 2.5 MCG/ACT AERS INHALE TWO SPRAY(S) BY MOUTH ONCE DAILY 1 Inhaler 5  . spironolactone (ALDACTONE) 25 MG tablet TAKE ONE TABLET BY MOUTH ONCE DAILY 90 tablet 1   No facility-administered medications prior to visit.        Objective:   Physical Exam Vitals:   09/26/16 1110 09/26/16 1112  BP:  102/62  Pulse:  82  SpO2:  96%  Weight: 198 lb (89.8 kg)   Height: 5\' 8"  (1.727 m)    Gen: Pleasant, well-nourished, in no distress,  normal affect  ENT: No lesions,  no postnasal drip  Neck: No JVD, no stridor  Lungs: No use of accessory muscles,  clear without rales or rhonchi  Cardiovascular: RRR, heart sounds normal, no murmur or gallops, no peripheral edema  Musculoskeletal: No deformities, no cyanosis or clubbing. He does have nicotine staining on his nails  Neuro: alert, non focal  Skin: Warm, no lesions or rashes     Assessment & Plan:  Adenocarcinoma of right lung Currently off therapy. He does not  have a follow-up visit with Dr. Julien Nordmann at this time. He is not sure he needs to follow-up. I've asked him to follow with me. If he does have recurrent symptoms that we presume are related to lung cancer it still may be beneficial for [place] to talk about these with Dr. Julien Nordmann to see if any additional therapy would be palliative in nature. Organize this when symptoms evolve. Chest x-ray today  COPD (chronic obstructive pulmonary disease) We will perform a CXR today Please continue your Spiriva once a day. start rinsing and gargling after taking this medication Keep albuterol available to use 2 puffs if needed for shortness of breath or wheezing.  Tobacco abuse Discussed decreasing his cigarettes with him today. He will work on this. He is not ready to quit altogether.  Baltazar Apo, MD, PhD 09/26/2016, 11:33 AM [facility] [phone] or if no answer [phone]

## 2016-09-26 NOTE — Assessment & Plan Note (Signed)
Discussed decreasing his cigarettes with him today. He will work on this. He is not ready to quit altogether.

## 2016-09-26 NOTE — Assessment & Plan Note (Signed)
Currently off therapy. He does not have a follow-up visit with Dr. Julien Nordmann at this time. He is not sure he needs to follow-up. I've asked him to follow with me. If he does have recurrent symptoms that we presume are related to lung cancer it still may be beneficial for [place] to talk about these with Dr. Julien Nordmann to see if any additional therapy would be palliative in nature. Organize this when symptoms evolve. Chest x-ray today

## 2016-09-26 NOTE — Assessment & Plan Note (Signed)
We will perform a CXR today Please continue your Spiriva once a day. start rinsing and gargling after taking this medication Keep albuterol available to use 2 puffs if needed for shortness of breath or wheezing.

## 2016-10-17 ENCOUNTER — Ambulatory Visit: Payer: Medicare Other | Admitting: Family

## 2016-10-17 ENCOUNTER — Encounter (HOSPITAL_COMMUNITY): Payer: Medicare Other

## 2016-11-23 ENCOUNTER — Telehealth: Payer: Self-pay

## 2016-11-23 NOTE — Telephone Encounter (Signed)
On 11/23/16 I received a death certificate from [place] ([place]). The death certificate is for donation. The patient is a patient of Doctor Byrum.  The d/c will be taken to [facility] for signature this pm.   On 12/03/2016 I received the death certificate back form Doctor Byrum. I got the d/c ready and called the funeral to let them know the d/c is ready for pickup.

## 2016-11-28 ENCOUNTER — Telehealth: Payer: Self-pay | Admitting: Emergency Medicine

## 2016-11-28 NOTE — Telephone Encounter (Signed)
I called the pt's wife Izora Gala to give our condolences on Raydan's death. I have his death certificate at office and want to insure that I understand the circumstances of his death before completing it. I will try to contact her again.

## 2016-11-30 NOTE — Telephone Encounter (Signed)
I completed the death certificate and left in office, Side A. Thanks.

## 2016-12-04 DEATH — deceased

## 2016-12-11 ENCOUNTER — Other Ambulatory Visit: Payer: Self-pay | Admitting: Nurse Practitioner
